# Patient Record
Sex: Female | Born: 1950
Health system: Southern US, Community
[De-identification: ages and names within clinical notes are randomized; demographics above are authoritative.]

## PROBLEM LIST (undated history)

## (undated) DIAGNOSIS — C801 Malignant (primary) neoplasm, unspecified: Secondary | ICD-10-CM

## (undated) DIAGNOSIS — I10 Essential (primary) hypertension: Secondary | ICD-10-CM

## (undated) DIAGNOSIS — S42309A Unspecified fracture of shaft of humerus, unspecified arm, initial encounter for closed fracture: Secondary | ICD-10-CM

## (undated) DIAGNOSIS — B019 Varicella without complication: Secondary | ICD-10-CM

## (undated) DIAGNOSIS — E785 Hyperlipidemia, unspecified: Secondary | ICD-10-CM

## (undated) DIAGNOSIS — T4145XA Adverse effect of unspecified anesthetic, initial encounter: Secondary | ICD-10-CM

## (undated) HISTORY — DX: Unspecified fracture of shaft of humerus, unspecified arm, initial encounter for closed fracture: S42.309A

## (undated) HISTORY — DX: Varicella without complication: B01.9

## (undated) HISTORY — DX: Hyperlipidemia, unspecified: E78.5

## (undated) HISTORY — DX: Essential (primary) hypertension: I10

## (undated) HISTORY — PX: TUBAL LIGATION: SHX77

---

## 1996-05-14 DIAGNOSIS — T8859XA Other complications of anesthesia, initial encounter: Secondary | ICD-10-CM

## 1996-05-14 HISTORY — PX: ABDOMINAL HYSTERECTOMY: SHX81

## 1996-05-14 HISTORY — DX: Other complications of anesthesia, initial encounter: T88.59XA

## 2001-05-22 ENCOUNTER — Other Ambulatory Visit: Admission: RE | Admit: 2001-05-22 | Discharge: 2001-05-22 | Payer: Self-pay | Admitting: Obstetrics and Gynecology

## 2002-02-12 ENCOUNTER — Encounter: Payer: Self-pay | Admitting: Emergency Medicine

## 2002-02-13 ENCOUNTER — Encounter: Payer: Self-pay | Admitting: Family Medicine

## 2002-02-13 ENCOUNTER — Inpatient Hospital Stay (HOSPITAL_COMMUNITY): Admission: EM | Admit: 2002-02-13 | Discharge: 2002-02-16 | Payer: Self-pay | Admitting: Emergency Medicine

## 2002-02-15 ENCOUNTER — Encounter: Payer: Self-pay | Admitting: Internal Medicine

## 2002-02-15 ENCOUNTER — Encounter (INDEPENDENT_AMBULATORY_CARE_PROVIDER_SITE_OTHER): Payer: Self-pay | Admitting: *Deleted

## 2002-05-14 HISTORY — PX: CHOLECYSTECTOMY: SHX55

## 2004-06-21 ENCOUNTER — Other Ambulatory Visit: Admission: RE | Admit: 2004-06-21 | Discharge: 2004-06-21 | Payer: Self-pay | Admitting: Obstetrics and Gynecology

## 2006-05-14 LAB — HM COLONOSCOPY: HM Colonoscopy: NORMAL

## 2007-01-01 ENCOUNTER — Ambulatory Visit (HOSPITAL_COMMUNITY): Admission: RE | Admit: 2007-01-01 | Discharge: 2007-01-01 | Payer: Self-pay | Admitting: Gastroenterology

## 2008-01-13 LAB — CONVERTED CEMR LAB: Pap Smear: NORMAL

## 2008-01-13 LAB — HM MAMMOGRAPHY: HM Mammogram: NORMAL

## 2008-03-11 ENCOUNTER — Encounter: Payer: Self-pay | Admitting: Family Medicine

## 2009-02-09 ENCOUNTER — Ambulatory Visit: Payer: Self-pay | Admitting: Family Medicine

## 2009-02-09 DIAGNOSIS — E785 Hyperlipidemia, unspecified: Secondary | ICD-10-CM | POA: Insufficient documentation

## 2009-02-09 DIAGNOSIS — I10 Essential (primary) hypertension: Secondary | ICD-10-CM | POA: Insufficient documentation

## 2009-02-09 LAB — CONVERTED CEMR LAB
ALT: 16 units/L (ref 0–35)
AST: 16 units/L (ref 0–37)
Albumin: 3.8 g/dL (ref 3.5–5.2)
Alkaline Phosphatase: 67 units/L (ref 39–117)
BUN: 15 mg/dL (ref 6–23)
Bilirubin, Direct: 0 mg/dL (ref 0.0–0.3)
CO2: 30 meq/L (ref 19–32)
Calcium: 8.8 mg/dL (ref 8.4–10.5)
Chloride: 108 meq/L (ref 96–112)
Cholesterol, target level: 200 mg/dL
Cholesterol: 169 mg/dL (ref 0–200)
Creatinine, Ser: 0.6 mg/dL (ref 0.4–1.2)
GFR calc non Af Amer: 108.46 mL/min (ref >60)
Glucose, Bld: 89 mg/dL (ref 70–99)
HDL goal, serum: 40 mg/dL
HDL: 42.9 mg/dL (ref >39.00)
LDL Cholesterol: 102 mg/dL — ABNORMAL HIGH (ref 0–99)
LDL Goal: 130 mg/dL
Potassium: 3.9 meq/L (ref 3.5–5.1)
Sodium: 142 meq/L (ref 135–145)
Total Bilirubin: 0.6 mg/dL (ref 0.3–1.2)
Total CHOL/HDL Ratio: 4
Total Protein: 6.6 g/dL (ref 6.0–8.3)
Triglycerides: 120 mg/dL (ref 0.0–149.0)
VLDL: 24 mg/dL (ref 0.0–40.0)

## 2010-01-19 ENCOUNTER — Telehealth: Payer: Self-pay | Admitting: Family Medicine

## 2010-02-09 ENCOUNTER — Ambulatory Visit: Payer: Self-pay | Admitting: Family Medicine

## 2010-02-09 LAB — CONVERTED CEMR LAB
ALT: 19 units/L (ref 0–35)
AST: 19 units/L (ref 0–37)
Albumin: 4.3 g/dL (ref 3.5–5.2)
Alkaline Phosphatase: 80 units/L (ref 39–117)
BUN: 20 mg/dL (ref 6–23)
Basophils Absolute: 0 10*3/uL (ref 0.0–0.1)
Basophils Relative: 0.3 % (ref 0.0–3.0)
Bilirubin Urine: NEGATIVE
Bilirubin, Direct: 0.1 mg/dL (ref 0.0–0.3)
CO2: 29 meq/L (ref 19–32)
Calcium: 9.1 mg/dL (ref 8.4–10.5)
Chloride: 101 meq/L (ref 96–112)
Cholesterol: 216 mg/dL — ABNORMAL HIGH (ref 0–200)
Creatinine, Ser: 0.7 mg/dL (ref 0.4–1.2)
Direct LDL: 137.9 mg/dL
Eosinophils Absolute: 0.1 10*3/uL (ref 0.0–0.7)
Eosinophils Relative: 1.2 % (ref 0.0–5.0)
GFR calc non Af Amer: 93.55 mL/min (ref >60)
Glucose, Bld: 84 mg/dL (ref 70–99)
Glucose, Urine, Semiquant: NEGATIVE
HCT: 35.7 % — ABNORMAL LOW (ref 36.0–46.0)
HDL: 52.7 mg/dL (ref >39.00)
Hemoglobin: 12.4 g/dL (ref 12.0–15.0)
Lymphocytes Relative: 40.4 % (ref 12.0–46.0)
Lymphs Abs: 2 10*3/uL (ref 0.7–4.0)
MCHC: 34.9 g/dL (ref 30.0–36.0)
MCV: 93.6 fL (ref 78.0–100.0)
Monocytes Absolute: 0.3 10*3/uL (ref 0.1–1.0)
Monocytes Relative: 7 % (ref 3.0–12.0)
Neutro Abs: 2.5 10*3/uL (ref 1.4–7.7)
Neutrophils Relative %: 51.1 % (ref 43.0–77.0)
Nitrite: NEGATIVE
Platelets: 248 10*3/uL (ref 150.0–400.0)
Potassium: 3.5 meq/L (ref 3.5–5.1)
Protein, U semiquant: NEGATIVE
RBC: 3.81 M/uL — ABNORMAL LOW (ref 3.87–5.11)
RDW: 12.6 % (ref 11.5–14.6)
Sodium: 138 meq/L (ref 135–145)
Specific Gravity, Urine: 1.025
TSH: 1.83 microintl units/mL (ref 0.35–5.50)
Total Bilirubin: 0.8 mg/dL (ref 0.3–1.2)
Total CHOL/HDL Ratio: 4
Total Protein: 7.1 g/dL (ref 6.0–8.3)
Triglycerides: 189 mg/dL — ABNORMAL HIGH (ref 0.0–149.0)
Urobilinogen, UA: 0.2
VLDL: 37.8 mg/dL (ref 0.0–40.0)
WBC Urine, dipstick: NEGATIVE
WBC: 4.8 10*3/uL (ref 4.5–10.5)
pH: 6

## 2010-02-21 ENCOUNTER — Ambulatory Visit: Payer: Self-pay | Admitting: Family Medicine

## 2010-06-09 ENCOUNTER — Ambulatory Visit
Admission: RE | Admit: 2010-06-09 | Discharge: 2010-06-09 | Payer: Self-pay | Source: Home / Self Care | Attending: Family Medicine | Admitting: Family Medicine

## 2010-06-09 DIAGNOSIS — R509 Fever, unspecified: Secondary | ICD-10-CM | POA: Insufficient documentation

## 2010-06-13 ENCOUNTER — Ambulatory Visit
Admission: RE | Admit: 2010-06-13 | Discharge: 2010-06-13 | Payer: Self-pay | Source: Home / Self Care | Attending: Family Medicine | Admitting: Family Medicine

## 2010-06-13 DIAGNOSIS — J159 Unspecified bacterial pneumonia: Secondary | ICD-10-CM | POA: Insufficient documentation

## 2010-06-13 NOTE — Progress Notes (Signed)
Summary: REFILL REQUEST #30, has OV 10/11  Phone Note Refill Request Message from:  Patient on January 19, 2010 2:56 PM  Refills Requested: Medication #1:  LISINOPRIL-HYDROCHLOROTHIAZIDE 20-12.5 MG TABS once daily   Notes: [facility] on [address].... Pt has cpx appt on 10/11....Marland KitchenMarland KitchenPt would like enough of med sent in to do her till her cpx appt on 10/11.  Medication #2:  PRAVASTATIN SODIUM 40 MG TABS once daily   Notes: [facility] on [address].... Pt has cpx appt on 10/11....Marland KitchenMarland KitchenPt would like enough of med sent in to do her till her cpx appt on 10/11.  Medication #3:  ESTRADIOL 0.5 MG TABS once daily.   Notes: [facility] on [address].... Pt has cpx appt on 10/11....Marland KitchenMarland KitchenPt would like enough of med sent in to do her till her cpx appt on 10/11.    Initial call taken by: Debbra Riding,  January 19, 2010 2:57 PM  Follow-up for Phone Call        Rx meds sent, pt informed Follow-up by: Sid Falcon LPN,  January 19, 2010 5:33 PM    Prescriptions: ESTRADIOL 0.5 MG TABS (ESTRADIOL) once daily  #30 x 0   Entered by:   Sid Falcon LPN   Authorized by:   Evelena Peat MD   Signed by:   Sid Falcon LPN on 40/02/2724   Method used:   Electronically to        [facility] [address] [facility] (retail)       [address]       [place], [place]  [address]       Ph: [phone]       Fax: [phone]   RxID:   [id number] PRAVASTATIN SODIUM 40 MG TABS (PRAVASTATIN SODIUM) once daily  #30 x 0   Entered by:   Sid Falcon LPN   Authorized by:   Evelena Peat MD   Signed by:   Sid Falcon LPN on 09/32/3557   Method used:   Electronically to        [facility]       [address]       [place], [place]  [address]       Ph: [phone]       Fax: [phone]   RxID:   [id number] LISINOPRIL-HYDROCHLOROTHIAZIDE 20-12.5 MG TABS (LISINOPRIL-HYDROCHLOROTHIAZIDE) once daily  #30 x 0   Entered by:    Sid Falcon LPN   Authorized by:   Evelena Peat MD   Signed by:   Sid Falcon LPN on 70/35/0093   Method used:   Electronically to        [facility]       [address]       [place], [place]  [address]       Ph: [phone]       Fax: [phone]   RxID:   [id number]

## 2010-06-13 NOTE — Assessment & Plan Note (Signed)
Summary: CPX // RS---PT [facility] // RS   Vital Signs:  Patient profile:   60 year old female Menstrual status:  hysterectomy Height:      63.75 inches Weight:      127 pounds BMI:     22.05 Temp:     98.6 degrees F oral Pulse rate:   72 / minute Pulse rhythm:   regular Resp:     12 per minute BP sitting:   120 / 82  (left arm) Cuff size:   regular  Vitals Entered By: Sid Falcon LPN (February 21, 2010 2:58 PM)  History of Present Illness: Patient here today for complete physical examination. She continues to see his gynecologist yearly for mammograms and every  third year Pap smears.  last tetanus unknown. No history of zostavax. Needs refills of regular medications.  Patient exercising regularly. Past medical history, social history, and family history reviewed.  Pt also here to addresse chronic problems of hypertension and hyperlipidemia.  Hypertension History:      She denies headache, chest pain, palpitations, dyspnea with exertion, orthopnea, peripheral edema, visual symptoms, neurologic problems, syncope, and side effects from treatment.  She notes no problems with any antihypertensive medication side effects.        Positive major cardiovascular risk factors include female age 67 years old or older, hyperlipidemia, and hypertension.  Negative major cardiovascular risk factors include no history of diabetes, negative family history for ischemic heart disease, and non-tobacco-user status.        Further assessment for target organ damage reveals no history of ASHD, stroke/TIA, or peripheral vascular disease.    Lipid Management History:      Positive NCEP/ATP III risk factors include female age 71 years old or older and hypertension.  Negative NCEP/ATP III risk factors include no history of early menopause without estrogen hormone replacement, non-diabetic, no family history for ischemic heart disease, non-tobacco-user status, no ASHD (atherosclerotic heart disease), no prior  stroke/TIA, no peripheral vascular disease, and no history of aortic aneurysm.      Allergies (verified): No Known Drug Allergies  Past History:  Past Medical History: Last updated: 02/09/2009 Anemia Chicken pox Hyperlipidemia Hypertension  Past Surgical History: Last updated: 02/09/2009 Cholecystectomy  2004 Hysterectomy  1998  Family History: Last updated: 02/09/2009 Family History of Alcoholism/Addiction Family History of Arthritis Family History Hypertension Family History of Stroke   Social History: Last updated: 02/09/2009 Occupation:  Physiological scientist Married Never Smoked Alcohol use-no Regular exercise-yes  Risk Factors: Exercise: yes (02/09/2009)  Risk Factors: Smoking Status: never (02/09/2009) PMH-FH-SH reviewed for relevance  Review of Systems  The patient denies anorexia, fever, weight loss, weight gain, vision loss, decreased hearing, hoarseness, chest pain, syncope, dyspnea on exertion, peripheral edema, prolonged cough, headaches, hemoptysis, abdominal pain, melena, hematochezia, severe indigestion/heartburn, hematuria, incontinence, genital sores, muscle weakness, suspicious skin lesions, transient blindness, difficulty walking, depression, unusual weight change, abnormal bleeding, enlarged lymph nodes, and breast masses.    Physical Exam  General:  Well-developed,well-nourished,in no acute distress; alert,appropriate and cooperative throughout examination Head:  Normocephalic and atraumatic without obvious abnormalities. No apparent alopecia or balding. Eyes:  No corneal or conjunctival inflammation noted. EOMI. Perrla. Funduscopic exam benign, without hemorrhages, exudates or papilledema. Vision grossly normal. Ears:  External ear exam shows no significant lesions or deformities.  Otoscopic examination reveals clear canals, tympanic membranes are intact bilaterally without bulging, retraction, inflammation or discharge. Hearing is grossly  normal bilaterally. Mouth:  Oral mucosa and oropharynx without lesions or exudates.  Teeth in good repair. Neck:  No deformities, masses, or tenderness noted. Breasts:  gyn Lungs:  Normal respiratory effort, chest expands symmetrically. Lungs are clear to auscultation, no crackles or wheezes. Heart:  Normal rate and regular rhythm. S1 and S2 normal without gallop, murmur, click, rub or other extra sounds. Abdomen:  Bowel sounds positive,abdomen soft and non-tender without masses, organomegaly or hernias noted. Genitalia:  gyn Msk:  No deformity or scoliosis noted of thoracic or lumbar spine.   Extremities:  No clubbing, cyanosis, edema, or deformity noted with normal full range of motion of all joints.   Neurologic:  No cranial nerve deficits noted. Station and gait are normal. Plantar reflexes are down-going bilaterally. DTRs are symmetrical throughout. Sensory, motor and coordinative functions appear intact. Skin:  no rashes and no suspicious lesions.   Cervical Nodes:  No lymphadenopathy noted Psych:  normally interactive, good eye contact, not anxious appearing, and not depressed appearing.     Impression & Recommendations:  Problem # 1:  Preventive Health Care (ICD-V70.0) tetanus booster given.  She will check with insurance regarding [place] vaccine coverage. labs reviewed with pt and no concerns.  Flu vaccine given.  Problem # 2:  HYPERTENSION (ICD-401.9) stable.  meds refilled. Her updated medication list for this problem includes:    Lisinopril-hydrochlorothiazide 20-12.5 Mg Tabs (Lisinopril-hydrochlorothiazide) ..... Once daily  Problem # 3:  HYPERLIPIDEMIA (ICD-272.4) refill med for one year. Her updated medication list for this problem includes:    Pravastatin Sodium 40 Mg Tabs (Pravastatin sodium) ..... Once daily  Complete Medication List: 1)  Lisinopril-hydrochlorothiazide 20-12.5 Mg Tabs (Lisinopril-hydrochlorothiazide) .... Once daily 2)  Pravastatin Sodium 40 Mg  Tabs (Pravastatin sodium) .... Once daily 3)  Estradiol 0.5 Mg Tabs (Estradiol) .... Once daily 4)  Calcium-vitamin D 600-200 Mg-unit Tabs (Calcium-vitamin d) .... One daily 5)  Vitamin C 500 Mg Tabs (Ascorbic acid) .... Once daily  Other Orders: Admin 1st Vaccine (08657) Flu Vaccine 4yrs + (84696) Tdap => 62yrs IM (29528) Admin of Any Addtl Vaccine (41324)  Hypertension Assessment/Plan:      The patient's hypertensive risk group is category B: At least one risk factor (excluding diabetes) with no target organ damage.  Her calculated 10 year risk of coronary heart disease is 9 %.  Today's blood pressure is 120/82.    Lipid Assessment/Plan:      Based on NCEP/ATP III, the patient's risk factor category is "2 or more risk factors and a calculated 10 year CAD risk of < 20%".  The patient's lipid goals are as follows: Total cholesterol goal is 200; LDL cholesterol goal is 130; HDL cholesterol goal is 40; Triglyceride goal is 150.    Patient Instructions: 1)  Check with insurance to determine if shingles (Zostavax) vaccine is covered. 2)  It is important that you exercise reguarly at least 20 minutes 5 times a week. If you develop chest pain, have severe difficulty breathing, or feel very tired, stop exercising immediately and seek medical attention.  Prescriptions: LISINOPRIL-HYDROCHLOROTHIAZIDE 20-12.5 MG TABS (LISINOPRIL-HYDROCHLOROTHIAZIDE) once daily  #90 x 3   Entered and Authorized by:   Evelena Peat MD   Signed by:   Evelena Peat MD on 02/21/2010   Method used:   Electronically to        [facility]       [address]       [place], [place]  [address]  Ph: [phone]       Fax: [phone]   RxID:   [id number] PRAVASTATIN SODIUM 40 MG TABS (PRAVASTATIN SODIUM) once daily  #90 x 3   Entered and Authorized by:   Evelena Peat MD   Signed by:   Evelena Peat MD on 02/21/2010   Method used:   Electronically to         [facility]       [address]       [place], [place]  [address]       Ph: [phone]       Fax: [phone]   RxID:   [id number]    Flu Vaccine Consent Questions     Do you have a history of severe allergic reactions to this vaccine? no    Any prior history of allergic reactions to egg and/or gelatin? no    Do you have a sensitivity to the preservative Thimersol? no    Do you have a past history of Guillan-Barre Syndrome? no    Do you currently have an acute febrile illness? no    Have you ever had a severe reaction to latex? no    Vaccine information given and explained to patient? yes    Are you currently pregnant? no    Lot Number:[id number]   Exp Date:11/11/2010   Site Given  Left Deltoid IMbflu   Immunizations Administered:  Tetanus Vaccine:    Vaccine Type: Tdap    Site: right deltoid    Mfr: GlaxoSmithKline    Dose: 0.5 ml    Route: IM    Given by: Sid Falcon LPN    Exp. Date: 02/12/2012    Lot #: [id number]    VIS given: 03/31/08 version given February 21, 2010.

## 2010-06-21 NOTE — Assessment & Plan Note (Signed)
Summary: 5 day follow-up pneumonia/nn   Vital Signs:  Patient profile:   60 year old female Menstrual status:  hysterectomy Weight:      130 pounds Temp:     98.8 degrees F oral BP sitting:   150 / 82  (left arm) Cuff size:   regular  Vitals Entered By: Sid Falcon LPN (June 13, 2010 9:37 AM)  History of Present Illness:  Followup recent left lower lobe pneumonia. patient was afebrile a little over 24 hours after starting antibiotic. overall feels much better. Does have some productive cough. Few days ago after coughing noticed sharp right midthoracic rib cage pain. Patient may have a broken rib. Pain with cough or sneeze. No hemoptysis. taking fluids well. no nausea or vomiting. Chest x-ray confirmed left lower lobe pneumonia  Allergies (verified): No Known Drug Allergies  Past History:  Past Medical History: Last updated: 02/09/2009 Anemia Chicken pox Hyperlipidemia Hypertension  Review of Systems  The patient denies anorexia, fever, hoarseness, chest pain, syncope, dyspnea on exertion, peripheral edema, and hemoptysis.    Physical Exam  General:  Well-developed,well-nourished,in no acute distress; alert,appropriate and cooperative throughout examination Neck:  No deformities, masses, or tenderness noted. Chest Wall:   tenderness right thoracic rib cage area around T7 to 8 posteriorly Lungs:   few faint rales left base. Right base clear. Heart:  Normal rate and regular rhythm. S1 and S2 normal without gallop, murmur, click, rub or other extra sounds. Extremities:   no edema   Impression & Recommendations:  Problem # 1:  BACTERIAL PNEUMONIA (ICD-482.9) Assessment Improved  Her updated medication list for this problem includes:    Azithromycin 250 Mg Tabs (Azithromycin) .[facility]... 2  by mouth today then one by mouth once daily for 4 days  Problem # 2:  RIB PAIN (ICD-786.50) Assessment: New   possible rib fracture versus muscular. Short-term use of hydrocodone.  Ace wrap with four-inch Ace around region did provide some relief  Orders: Ace Wraps 3-5 in/yard  (Z6109)  Complete Medication List: 1)  Lisinopril-hydrochlorothiazide 20-12.5 Mg Tabs (Lisinopril-hydrochlorothiazide) .... Once daily 2)  Pravastatin Sodium 40 Mg Tabs (Pravastatin sodium) .... Once daily 3)  Estradiol 0.5 Mg Tabs (Estradiol) .... Once daily 4)  Calcium-vitamin D 600-200 Mg-unit Tabs (Calcium-vitamin d) .... One daily 5)  Vitamin C 500 Mg Tabs (Ascorbic acid) .... Once daily 6)  Azithromycin 250 Mg Tabs (Azithromycin) .... 2  by mouth today then one by mouth once daily for 4 days 7)  Hydrocodone-acetaminophen 5-325 Mg Tabs (Hydrocodone-acetaminophen) .Marland Kitchen.. 1-2 by mouth q 6 hours as needed pain  Patient Instructions: 1)  Follow up immediately for any fever or shortness of breath. Prescriptions: HYDROCODONE-ACETAMINOPHEN 5-325 MG TABS (HYDROCODONE-ACETAMINOPHEN) 1-2 by mouth q 6 hours as needed pain  #30 x 0   Entered and Authorized by:   Evelena Peat MD   Signed by:   Evelena Peat MD on 06/13/2010   Method used:   Print then Give to Patient   RxID:   [id number]    Orders Added: 1)  Est. Patient Level III [95621] 2)  Ace Wraps 3-5 in/yard  [H0865]

## 2010-06-21 NOTE — Assessment & Plan Note (Signed)
Summary: congestion//ccm   Vital Signs:  Patient profile:   60 year old female Menstrual status:  hysterectomy Weight:      130 pounds Temp:     102.3 degrees F oral BP sitting:   140 / 80  (left arm) Cuff size:   regular  Vitals Entered By: Sid Falcon LPN (June 09, 2010 9:37 AM) CC: congestion, cough, fever   History of Present Illness: Patient seen with febrile illness.  Onset about 3 weeks ago typical URI type symptoms with nasal congestion and mild body aches. She had some laryngitis which has resolved. Nasal symptoms have cleared. Cough has , possibly worse past few days. Developed fever around 101-102 about 4 days ago. Cough mostly nonproductive. Some relief with Mucinex DM and NyQuil. She denies any skin rashes, headache, abdominal pain, or dysuria. No nausea, vomiting, or diarrhea.  Allergies (verified): No Known Drug Allergies  Past History:  Past Medical History: Last updated: 02/09/2009 Anemia Chicken pox Hyperlipidemia Hypertension  Review of Systems       The patient complains of fever.  The patient denies anorexia, weight loss, hoarseness, syncope, dyspnea on exertion, prolonged cough, hemoptysis, abdominal pain, melena, hematochezia, and enlarged lymph nodes.    Physical Exam  General:  Well-developed,well-nourished,in no acute distress; alert,appropriate and cooperative throughout examination Eyes:  pupils equal, pupils round, and pupils reactive to light.   Ears:  External ear exam shows no significant lesions or deformities.  Otoscopic examination reveals clear canals, tympanic membranes are intact bilaterally without bulging, retraction, inflammation or discharge. Hearing is grossly normal bilaterally. Mouth:  Oral mucosa and oropharynx without lesions or exudates.  Teeth in good repair. Neck:  No deformities, masses, or tenderness noted. Lungs:  Normal respiratory effort, chest expands symmetrically. Lungs are clear to auscultation, no crackles or  wheezes. Heart:  Normal rate and regular rhythm. S1 and S2 normal without gallop, murmur, click, rub or other extra sounds. Abdomen:  soft, non-tender, and no masses.   Extremities:  No clubbing, cyanosis, edema, or deformity noted with normal full range of motion of all joints.   Skin:  skin is warm to touch. No rashes Cervical Nodes:  No lymphadenopathy noted   Impression & Recommendations:  Problem # 1:  FEVER UNSPECIFIED (ICD-780.60) recent likely viral illness, rule out pneumonia wtih "double sickening".  Check CXR and start antibiotics. Orders: T-2 View CXR (71020TC)  Complete Medication List: 1)  Lisinopril-hydrochlorothiazide 20-12.5 Mg Tabs (Lisinopril-hydrochlorothiazide) .... Once daily 2)  Pravastatin Sodium 40 Mg Tabs (Pravastatin sodium) .... Once daily 3)  Estradiol 0.5 Mg Tabs (Estradiol) .... Once daily 4)  Calcium-vitamin D 600-200 Mg-unit Tabs (Calcium-vitamin d) .... One daily 5)  Vitamin C 500 Mg Tabs (Ascorbic acid) .... Once daily 6)  Azithromycin 250 Mg Tabs (Azithromycin) .... 2  by mouth today then one by mouth once daily for 4 days  Patient Instructions: 1)  Be in touch by Monday if fever not resolved and follow up sooner if symptoms worsen. Prescriptions: AZITHROMYCIN 250 MG TABS (AZITHROMYCIN) 2  by mouth today then one by mouth once daily for 4 days  #6 x 0   Entered and Authorized by:   Evelena Peat MD   Signed by:   Evelena Peat MD on 06/09/2010   Method used:   Electronically to        [facility]       [address]       [place]  [place], [place]  [address]       Ph: [phone]       Fax: [phone]   RxID:   [id number]    Orders Added: 1)  T-2 View CXR [71020TC] 2)  Est. Patient Level III [32951]

## 2010-09-26 NOTE — Op Note (Signed)
Bridget Strong, Bridget Strong               ACCOUNT NO.:  [id number]   MEDICAL RECORD NO.:  [id number]          PATIENT TYPE:  AMB   LOCATION:  ENDO                         FACILITY:  [facility]   PHYSICIAN:  Anselmo Rod, M.D.  DATE OF BIRTH:  1950/12/24   DATE OF PROCEDURE:  01/01/2007  DATE OF DISCHARGE:                               OPERATIVE REPORT   PROCEDURE PERFORMED:  Screening colonoscopy.   ENDOSCOPIST:  Anselmo Rod, M.D.   INSTRUMENT USED:  Pentax video colonoscope.   INDICATION FOR PROCEDURE:  A 60 year old white female undergoing  screening colonoscopy to rule out colonic polyps, masses, etc.   PREPROCEDURE PREPARATION:  Informed consent was procured from the  patient.  The patient was fasted for 8 hours prior to the procedure and  prepped with a bottle of magnesium citrate and a gallon of NuLYTELY the  night prior to the procedure.  The risks and benefits of the procedure  including a 10% miss rate of cancer and polyps was discussed with the  patient as well.   PREPROCEDURE PREPARATION:  VITAL SIGNS:  The patient had stable vital  signs.  NECK:  Supple.  CHEST:  Clear to auscultation.  S1, S2 regular.  ABDOMEN:  Soft with normal bowel sounds.   DESCRIPTION OF PROCEDURE:  The patient was placed in the left lateral  decubitus position and sedated with 100 mcg of Fentanyl and 7 mg of  Versed intravenously in slow incremental doses.  Once the patient was  adequately sedate and maintained on low-flow oxygen and continuous  cardiac monitoring, the Pentax video colonoscope was advanced from the  rectum to the cecum.  Multiple washes were done. The appendiceal orifice  and the ileocecal valve were clearly visualized and photographed. The  terminal ileum appeared healthy and without lesions. No masses, polyps,  erosions, ulcerations or diverticula were seen. Retroflexion in the  rectum revealed no abnormalities.  The patient tolerated the procedure  well without any immediate  complications.   IMPRESSION:  Normal colonoscopy up to the terminal ileum.  No masses,  polyps or diverticula noted.   RECOMMENDATIONS:  1. Continue a high-fiber diet with liberal fluid intake.  2. Screening colonoscopy in the next 10 years unless the patient      develops any abnormal symptoms in the interim, in which case she      should contact the office immediately for further recommendations.  3. Outpatient follow-up as the need arises in the future.      Anselmo Rod, M.D.  Electronically Signed     JNM/MEDQ  D:  01/01/2007  T:  01/02/2007  Job:  [id number]   cc:   Elizabeth Palau, NP   Gloriajean Dell. Andrey Campanile, M.D.  Fax: [phone]

## 2010-09-29 NOTE — Discharge Summary (Signed)
   NAMELOVENIA, DEBRULER NO.:  [id number]   MEDICAL RECORD NO.:  [id number]                   PATIENT TYPE:   LOCATION:                                       FACILITY:   PHYSICIAN:  Lonia Blood, M.D.            DATE OF BIRTH:  1950/10/30   DATE OF ADMISSION:  02/13/2002  DATE OF DISCHARGE:  02/16/2002                                 DISCHARGE SUMMARY   DISCHARGE DIAGNOSES:  1. Gallstone pancreatitis -- resolved.  2. Cholelithiasis with high risk for recurrent gallstone pancreatitis --     status post laparoscopic cholecystectomy.  3. History of idiopathic peripheral neuropathy.  4. Status post vaginal hysterectomy.   DISCHARGE MEDICATIONS:  Vicodin p.r.n. and Neurontin as previously dosed for  peripheral neuropathy.   HISTORY OF PRESENT ILLNESS:  The patient is a 60 year old who presented to  the hospital on February 13, 2002 with complaints of five days of developing  progressive abdominal pain with nausea and vomiting.  She reported loose  stools but no diarrhea.  She was initially evaluated in the office of  [facility], at which time the patient's abdomen was noted  to be severely tender and therefore, she was referred directly to the  emergency room.  In the emergency room, evaluation revealed significant  gallstones and lipase elevated at 2075, consistent with pancreatitis.   HOSPITAL COURSE:  At admission, the patient was noted to have a significant  transaminitis with markedly increased lipase at 2075.  Ultrasound revealed  significant gallstones within the gallbladder; diagnosis of cholelithiasis  with gallstone pancreatitis was made.  The patient was maintained on n.p.o.  status and GI was consulted.  GI performed an ERCP on February 13, 2002 with  successful sphincterotomy but no evidence of retained stones within the  common bile duct.  The patient was treated medically with IV fluid hydration  and clear liquid diet,  until such time that her pancreatitis had resolved.  General surgery had been consulted and at such time that the patient's  pancreatitis had resolved, she was taken to the operating room for a  laparoscopic cholecystectomy; this was accomplished without significant  difficulty and on October 6th, the patient was seen in postop followup by  general surgery and felt to be doing quite well.  She was subsequently  discharged home in stable condition.  The patient's peripheral neuropathy  was not a significant issue during this hospitalization.                                               Lonia Blood, M.D.    JTM/MEDQ  D:  04/29/2002  T:  05/01/2002  Job:  [id number]

## 2010-09-29 NOTE — Op Note (Signed)
Bridget Strong, Bridget Strong                         ACCOUNT NO.:  [id number]   MEDICAL RECORD NO.:  [id number]                   PATIENT TYPE:  INP   LOCATION:  5705                                 FACILITY:  [facility]   PHYSICIAN:  Jimmye Norman III, M.D.               DATE OF BIRTH:  04-Feb-1951   DATE OF PROCEDURE:  02/15/2002  DATE OF DISCHARGE:                                 OPERATIVE REPORT   PREOPERATIVE DIAGNOSIS:  Symptomatic cholelithiasis and gallstone  pancreatitis.   POSTOPERATIVE DIAGNOSIS:  Symptomatic cholelithiasis and gallstone  pancreatitis.   PROCEDURE:  Laparoscopic cholecystectomy with intraoperative cholangiogram.   SURGEON:  Jimmye Norman, M.D.   ASSISTANT:  None.   ANESTHESIA:  General endotracheal.   ESTIMATED BLOOD LOSS:  Less than 30 cc.   COMPLICATIONS:  Failed insufflating, causing 30 minute delay.   CONDITION:  Stable.   FINDINGS:  Normal cholangiogram.  Adhesions of omentum to the gallbladder  wall.  The patient also had a thickened cystic duct.   SPECIMENS:  Gallbladder with stones.   INDICATION FOR OPERATION:  The patient is a 60 year old female brought in  with abdominal pain, right upper quadrant and epigastrium to the back with  an amylase and lipase both over 1500.  She comes in now for a laparoscopic  cholecystectomy after her pancreatitis has resolved.  She did get an ERCP  preoperatively which showed no common duct stones.   OPERATION:  The patient was taken to the operating room and placed on the  table in the supine position.  After an adequate induction of general  anesthetic was administered, she was prepped and draped in the usual sterile  manner, exposing the midline and the right upper quadrant of the abdomen.   Initially, we tried to use a Veress needle to penetrate the peritoneal  cavity and after successful penetration with a good saline test, we were  unable to insufflate it because of a failed insufflator.  We converted to a  Hasson cannula and did an open technique with 0 Vicryl and the funneled  Hasson cannula, however, this failed to insufflate also, and this is why we  realized that the insufflator was defective, causing about a thirty minute  delay and the use of multiple pieces of equipment.   Once we had the right insufflator with the Hasson cannula in place, we were  able to insufflate up to a maximum intraabdominal pressure of 15 mmHg.  Two  subcostal 5 mm cannulas, in the subxiphoid, two 11 mm cannulas were passed  under direct vision into the peritoneal cavity.  Once this was done, we were  able to inspect the area well.  We placed the patient in a reverse  Trendelenburg position, left side was tilted down and the dissection begun.   Initially, the omental adhesions had to be stripped from the gallbladder  body using blunt dissection with [facility].  We subsequently were  able to dissect out the cystic duct and the cystic artery, Endo clipped them  proximally and distally.  Prior to putting in the distal cystic duct, we did  perform a cholangiogram through cholecystodochotomy made using Endo-  scissors.  This demonstrated flow into the duodenum, proximal flow, and no  evidence of stones.   The common bile duct was also normal.  Once the cholangiogram was done, we  removed the clips, removed the cannula, clipped the cystic duct proximally  with two Endo-Clips and then transected the cystic duct.  We transected the  cystic artery and then dissected the gallbladder out of its bed without  entrance into the gallbladder.  In spite of this, we used the Endo-Catch bag  to bring the gallbladder out through the supraumbilical fascia with minimal  difficulty.  An additional 0 Vicryl suture of eight stitch had to be placed  at the supraumbilical site to close the fascia.   Once we had done this, we irrigated with about 800 cc of warm saline  solution.  There was minimal bleeding and no bile  staining.  We allowed all  gas to escape through the cannulas.  With the supraumbilical fascia closed,  we injected 0.25% Marcaine into all incision sites and closed the skin using  a running subcuticular stitch of 4-0 Vicryl.  Sterile dressings were then  applied.  All needle counts, sponge counts, and instrument counts were  correct.                                               Bridget Strong, M.D.    JW/MEDQ  D:  02/15/2002  T:  02/16/2002  Job:  [id number]

## 2010-09-29 NOTE — Op Note (Signed)
Bridget Strong, Bridget Strong                         ACCOUNT NO.:  [id number]   MEDICAL RECORD NO.:  [id number]                   PATIENT TYPE:  INP   LOCATION:  5705                                 FACILITY:  [facility]   PHYSICIAN:  Danise Edge, M.D.                DATE OF BIRTH:  [birth year]   DATE OF PROCEDURE:  02/13/2002  DATE OF DISCHARGE:  02/16/2002                                 OPERATIVE REPORT   PREOPERATIVE DIAGNOSIS:  Endoscopic retrograde cholangiopancreatography with  endoscopic sphincterotomy.   INDICATIONS:  The patient is a 60 year old female born 05/14/2051.  The  patient was admitted to the hospital with epigastric pain, nausea, and  vomiting.  Evaluation in the emergency room reveals gallstones, gallstone  pancreatitis, and jaundice with elevated liver enzymes.   I discussed with the patient the complications associated with endoscopic  retrograde cholangiopancreatography with endoscopic sphincterotomy,  including pancreatitis, bleeding, infection, and intestinal perforation.  The patient has signed the operative permit.   MEDICATION ALLERGIES:  None.   CHRONIC MEDICATIONS:  Elavil for peripheral neuropathy, estrogen.   PAST MEDICAL HISTORY:  Vaginal hysterectomy, peripheral neuropathy.   SOCIAL HISTORY:  The patient is married.  She does not smoke cigarettes or  consume alcohol.   ENDOSCOPIST:  Danise Edge, M.D.   PREMEDICATION:  Versed 5 mg, fentanyl 50 mcg, Glucagon 0.5 mg.   ENDOSCOPE:  Olympus therapeutic duodenoscope.   DESCRIPTION OF PROCEDURE:  After obtaining informed consent, the patient was  placed in the prone positron on the fluoroscopy table.  I administered  intravenous fentanyl and intravenous Versed to achieve conscious sedation  for the procedure.  The patient's blood pressure, oxygen saturation, and  cardiac rhythm were monitored throughout the procedure and documented in the  medical record.   The Olympus therapeutic duodenoscope was  passed through the posterior  hypopharynx, down the esophagus, and into the proximal stomach without  examining the esophagus.  A normal-appearing pylorus was intubated without  gastric evaluation.  The endoscope was advanced to the second portion of the  duodenum without evaluation of the duodenal bulb.   Major papilla:  Endoscopically the major papilla appears completely normal.   Pancreatogram:  The pancreatic duct was not cannulated and was not injected  with contrast.   Cholangiogram:  The common bile duct was freely cannulated using the triple-  lumen sphincterotome.  The guidewire was placed in the proximal common bile  duct.  A cholangiogram was performed, revealing a small filling defect,  possibly a stone, in the distal common bile duct.  The cystic duct was  patent.  The intrahepatic ducts appeared normal.  A small sphincterotomy was  performed.  Multiple sweeps of the extrahepatic bile duct with the 12 mm  balloon catheter returned no significant stone fragments.  A post-  sphincterotomy occlusion cholangiogram was unremarkable.   RECOMMENDATIONS:  Proceed with a laparoscopic cholecystectomy with operative  cholangiogram after  resolution of the gallstone pancreatitis.                                               Danise Edge, M.D.    MJ/MEDQ  D:  02/13/2002  T:  02/16/2002  Job:  [id number]   cc:   Teena Irani. Arlyce Dice, M.D.

## 2010-09-29 NOTE — H&P (Signed)
Bridget Strong, Bridget Strong                         ACCOUNT NO.:  [id number]   MEDICAL RECORD NO.:  [id number]                   PATIENT TYPE:  INP   LOCATION:  1832                                 FACILITY:  [facility]   PHYSICIAN:  Delorse Lek, M.D.              DATE OF BIRTH:  [birth year]   DATE OF ADMISSION:  02/12/2002  DATE OF DISCHARGE:                                HISTORY & PHYSICAL   CHIEF COMPLAINT:  This is a 60 year old white female with a past medical  history significant for peripheral neuropathy.  Her chief complaint is  abdominal pain, nausea and vomiting.   HISTORY OF PRESENT ILLNESS:  Approximately five days ago she developed some  significant epigastric pain with nausea and vomiting.  She has had a few  loose stools but no true diarrhea.  Over the last three days she has noticed  her urine getting darker, stool getting lighter.  She has become  significantly pruritic, although no rash.  On February 11, 2002, she ate  dinner at [employer].  This caused increased pain with vomiting.  Yesterday all  she had was four crackers and some sips of water.  She came to the  [facility] on the night prior to admission.  She saw Dr.  Teena Irani. Arlyce Dice, who felt that she may have had a surgical abdomen, and she  was sent to the emergency room where an evaluation revealed gallstones with  a bilirubin of 5, and an amylase of 1104, lipase 2075, AST 67, ALT 210.  Of  note is that she restarted her estrogens three days before this attack.  She  had not taken any estrogens from June through September.  She also had an  attack in May and one many months prior to that.   PAST SURGICAL HISTORY:  1. Hospitalization/surgery:  She had a TVH in 1996, by Dr. Miguel Aschoff and     childbirth x3.   ALLERGIES:  No known drug allergies.   MEDICATIONS:  1. Elavil ? 100 mg q.h.s. for peripheral neuropathy (workup by [facility]urology).  2. She has been on estrogen for the last eight  days.   PAST MEDICAL HISTORY:  Illnesses, only as above.   REVIEW OF SYSTEMS:  Negative except for a weight gain related to the Elavil  and others, as mentioned above.   FAMILY HISTORY:  Father died at age 48 of a CVA.  He was an alcoholic.  Mother is alive at age 66, in good health.  There is no family history of  diabetes mellitus, hypertension, cancer, or gallbladder disease.   SOCIAL HISTORY:  She does not smoke, drink, or use illicit drugs.  She is  married and has three children.  She works as a Diplomatic Services operational officer at [employer].   PHYSICAL EXAMINATION:  VITAL SIGNS:  Temperature 98.2 degrees, pulse 86 and  regular, respirations  22 and unlabored, blood pressure 104/54, pulse  oximetry 100% on room air.  HEENT:  Normocephalic, atraumatic.  Pupils equal, reactive.  Sclerae  icteric.  Tympanic membranes okay.  Oropharynx and mucous membranes are  moist.  NECK:  Supple without significant nodes or thyromegaly.  LUNGS:  No rales or rhonchi.  HEART:  No murmurs, gallops, or rubs.  ABDOMEN:  Tender epigastric area but no HSM.  No masses, no guarding or  rebound.  BREASTS/PELVIC/RECTAL:  Deferred, not pertinent to admission.  EXTREMITIES:  No clubbing, cyanosis, or edema.  NEUROLOGIC:  Nonlateralizing.  DERMATOLOGIC:  Looks slightly jaundiced.   LABORATORY DATA:  White count 7100 with 79 polys, 16 lymphs, 5 monos,  hemoglobin 11.8, platelets 288,000.  Urine reveals a specific gravity 1.009,  pH 6.5, large bilirubin, and 15 ketones.  INR 0.8, PTT 36.  Total bilirubin  was 5.  Electrolytes normal.  Glucose minimally elevated at 114.  AST/ALT  67/210 respectively.  Alkaline phosphatase 369, lipase 2075, amylase 1104.  A gallbladder ultrasound revealed multiple stones, slight gallbladder wall  thickening.  The bile duct does not appear to be enlarged.   ASSESSMENT:  1. Gallstones with probable common bile duct stone that may have been     passed.   PLAN:  Will contact the GI doctor  in the morning (Dr. Katy Fitch. Buccini et  al), for a probable ERCP and probable laparoscopic cholecystectomy.  Will  contact [facility] for that.   1. Peripheral neuropathy.   PLAN:  Hold Elavil for now until the liver function tests become normal.   1. Hormone replacement therapy.   PLAN:  Hold estrogens for now.                                                Delorse Lek, M.D.    BAB/MEDQ  D:  02/13/2002  T:  02/16/2002  Job:  [id number]   cc:   Florencia Reasons, M.D.  [address], [address]  [place], [place] [address]  Fax: [phone]   [facility]   Miguel Aschoff, M.D.

## 2010-09-29 NOTE — Consult Note (Signed)
NAMEKHLOEI, SPIKER                         ACCOUNT NO.:  [id number]   MEDICAL RECORD NO.:  [id number]                   PATIENT TYPE:  INP   LOCATION:  1832                                 FACILITY:  [facility]   PHYSICIAN:  Jimmye Norman III, M.D.               DATE OF BIRTH:  [birth year]   DATE OF CONSULTATION:  02/13/2002  DATE OF DISCHARGE:                                   CONSULTATION   HISTORY OF PRESENT ILLNESS:  The patient is a 60 year old female with  abdominal pain since Sunday who now has been diagnosed with gallstone  pancreatitis, and possibly a common bile duct stone.   Now that she has been diagnosed, the patient can recall up two other  episodes of a similar abdominal pain but not as severe and not getting her  to come to an emergency room or to see a physician.   This started on Sunday, progressed and she had noticed darkened urine and  had developed her pruritus but did not really notice the change in the skin  color or her sclerae.  In the ED, she was noticed to be jaundiced and  subsequently workup led to the diagnosis of gallstone pancreatitis along  with a hypobilirubinemia.   PAST MEDICAL HISTORY:  Significant for peripheral neuropathy in bilateral  lower extremities which is idiopathic.  For that she does take Elavil.  She  takes no other medications.   ALLERGIES:  She is not allergic to any medications.   SOCIAL HISTORY:  She is a nondrinker, nonsmoker, does not take any drugs.   PAST SURGICAL HISTORY:  She has had a vaginal hysterectomy.   REVIEW OF SYSTEMS:  She has had pruritus, darkened urine, odd-colored stool.  No fevers or chills but has had nausea and vomiting.   PHYSICAL EXAMINATION:  VITAL SIGNS:  She is afebrile.  Her other vital signs  are stable.  HEENT:  She is normocephalic, atraumatic.  She has scleral icterus  bilaterally.  NECK:  She has no masses or bruits.  CHEST:  Clear to auscultation.  CARDIAC:  She has a regular rhythm and  rate and a grade 1 murmur at the left  lower sternal border.  ABDOMEN:  Mildly distended with epigastric tenderness but no diffuse  peritonitis and hypoactive bowel sounds.  PELVIC:  Not performed.  RECTAL:  Not performed.  LOWER EXTREMITIES:  She has good peripheral pulses, no tingling or numbness.  NEUROLOGICAL:  Cranial nerves II-XII are grossly intact.   LABORATORY DATA:  She has a normal white count of 7100, hemoglobin 11.8,  hematocrit 34.8, platelets 288,000.  Her bili is 5.0, SGOT and SGPT are  moderately elevated.  Her amylase is over 1000, actually 1104 and her lipase  is also over 2000 at 2075.  Her electrolytes are within normal limits.   IMPRESSION:  1. Gallstone pancreatitis based on an ultrasound demonstrating mildly  thickened gallbladder wall along with gallstones.  2. Possible common bile duct stones with hyperbilirubinemia and pruritus     secondary to them.   PLAN:  The plan would be for the patient to get a GI consultation and for  them to decide if an ERCP should be done based on either worsening LFTs or  nonimprovement in the liver function tests.  However, in the face of active  pancreatitis ERCP could be risky and this must be weighed in the decision  whether or not to perform the procedure.  Otherwise, we can delay ERCP and  possibly go to straight to cholecystectomy once her pancreatitis has  resolved or improved significantly.  At the current rate, I expect this may  be Sunday.                                               Kathrin Ruddy, M.D.    JW/MEDQ  D:  02/13/2002  T:  02/16/2002  Job:  [id number]   cc:   Rosanne Sack, M.D.  Fax: [phone]   GI Attending

## 2011-02-13 ENCOUNTER — Other Ambulatory Visit (INDEPENDENT_AMBULATORY_CARE_PROVIDER_SITE_OTHER): Payer: 59

## 2011-02-13 DIAGNOSIS — Z0000 Encounter for general adult medical examination without abnormal findings: Secondary | ICD-10-CM

## 2011-02-13 LAB — BASIC METABOLIC PANEL
BUN: 22 mg/dL (ref 6–23)
CO2: 30 mEq/L (ref 19–32)
Chloride: 103 mEq/L (ref 96–112)
GFR: 74.08 mL/min (ref >60.00)
Glucose, Bld: 96 mg/dL (ref 70–99)
Potassium: 3.8 mEq/L (ref 3.5–5.1)

## 2011-02-13 LAB — CBC WITH DIFFERENTIAL/PLATELET
Basophils Absolute: 0 10*3/uL (ref 0.0–0.1)
HCT: 38.9 % (ref 36.0–46.0)
Lymphs Abs: 2 10*3/uL (ref 0.7–4.0)
MCHC: 33.2 g/dL (ref 30.0–36.0)
MCV: 94.3 fl (ref 78.0–100.0)
Monocytes Absolute: 0.4 10*3/uL (ref 0.1–1.0)
Platelets: 257 10*3/uL (ref 150.0–400.0)
RDW: 12.9 % (ref 11.5–14.6)

## 2011-02-13 LAB — HEPATIC FUNCTION PANEL
Albumin: 4.3 g/dL (ref 3.5–5.2)
Total Bilirubin: 0.7 mg/dL (ref 0.3–1.2)

## 2011-02-13 LAB — LIPID PANEL
Cholesterol: 211 mg/dL — ABNORMAL HIGH (ref 0–200)
Total CHOL/HDL Ratio: 4
Triglycerides: 198 mg/dL — ABNORMAL HIGH (ref 0.0–149.0)
VLDL: 39.6 mg/dL (ref 0.0–40.0)

## 2011-02-13 LAB — POCT URINALYSIS DIPSTICK
Bilirubin, UA: NEGATIVE
Blood, UA: NEGATIVE
Nitrite, UA: NEGATIVE
Spec Grav, UA: 1.02
Urobilinogen, UA: 1
pH, UA: 7

## 2011-02-16 ENCOUNTER — Encounter: Payer: Self-pay | Admitting: Family Medicine

## 2011-02-19 ENCOUNTER — Encounter: Payer: Self-pay | Admitting: Family Medicine

## 2011-02-19 ENCOUNTER — Ambulatory Visit (INDEPENDENT_AMBULATORY_CARE_PROVIDER_SITE_OTHER): Payer: 59 | Admitting: Family Medicine

## 2011-02-19 VITALS — BP 110/70 | HR 72 | Temp 98.0°F | Resp 12 | Ht 63.5 in | Wt 128.0 lb

## 2011-02-19 DIAGNOSIS — Z23 Encounter for immunization: Secondary | ICD-10-CM

## 2011-02-19 DIAGNOSIS — Z0000 Encounter for general adult medical examination without abnormal findings: Secondary | ICD-10-CM

## 2011-02-19 MED ORDER — ESTRADIOL 0.5 MG PO TABS: 0.5000 mg | ORAL_TABLET | Freq: Every day | ORAL | Status: DC

## 2011-02-19 MED ORDER — LISINOPRIL-HYDROCHLOROTHIAZIDE 20-12.5 MG PO TABS: 1.0000 | ORAL_TABLET | Freq: Every day | ORAL | Status: DC

## 2011-02-19 MED ORDER — PRAVASTATIN SODIUM 40 MG PO TABS: 40.0000 mg | ORAL_TABLET | Freq: Every day | ORAL | Status: DC

## 2011-02-19 NOTE — Patient Instructions (Signed)
Check on insurance coverage for shingles vaccine. 

## 2011-02-19 NOTE — Progress Notes (Signed)
  Subjective:    Patient ID: Bridget Strong, female    DOB: Nov 05, 1950, 60 y.o.   MRN: [id number]  HPI Patient here for well visit. Previous hysterectomy for benign disease. Gets mammogram through gynecologist office. Needs flu vaccine. No indication for Pneumovax yet. No history of shingles vaccine and patient uncertain of insurance coverage. Colonoscopy 2008 normal. Patient exercises regularly.  Hypertension and hyperlipidemia and medications reviewed. No side effects from medication and compliant with all medications. She takes low-dose estradiol 0.5 mg 1 daily. Takes some calcium and vitamin D supplementation. Nonsmoker. Past medical history reviewed as below  Past Medical History  Diagnosis Date  . Anemia   . Chicken pox   . Hyperlipidemia   . Hypertension    Past Surgical History  Procedure Date  . Cholecystectomy 2004  . Abdominal hysterectomy 1998    reports that she has never smoked. She does not have any smokeless tobacco history on file. Her alcohol and drug histories not on file. family history includes Alcohol abuse in her father and other; Arthritis in her other; Hypertension in her other; and Stroke in her father and other. Allergies  Allergen Reactions  . Hydrocodone     Nausea, GI upset      Review of Systems  Constitutional: Negative for fever, activity change, appetite change, fatigue and unexpected weight change.  HENT: Negative for hearing loss, ear pain, sore throat and trouble swallowing.   Eyes: Negative for visual disturbance.  Respiratory: Negative for cough and shortness of breath.   Cardiovascular: Negative for chest pain and palpitations.  Gastrointestinal: Negative for abdominal pain, diarrhea, constipation and blood in stool.  Genitourinary: Negative for dysuria and hematuria.  Musculoskeletal: Negative for myalgias, back pain and arthralgias.  Skin: Negative for rash.  Neurological: Negative for dizziness, syncope and headaches.    Hematological: Negative for adenopathy.  Psychiatric/Behavioral: Negative for confusion and dysphoric mood.       Objective:   Physical Exam  Constitutional: She is oriented to person, place, and time. She appears well-developed and well-nourished.  HENT:  Head: Normocephalic and atraumatic.  Eyes: EOM are normal. Pupils are equal, round, and reactive to light.  Neck: Normal range of motion. Neck supple. No thyromegaly present.  Cardiovascular: Normal rate, regular rhythm and normal heart sounds.   No murmur heard. Pulmonary/Chest: Breath sounds normal. No respiratory distress. She has no wheezes. She has no rales.  Abdominal: Soft. Bowel sounds are normal. She exhibits no distension and no mass. There is no tenderness. There is no rebound and no guarding.  Musculoskeletal: Normal range of motion. She exhibits no edema.  Lymphadenopathy:    She has no cervical adenopathy.  Neurological: She is alert and oriented to person, place, and time. She displays normal reflexes. No cranial nerve deficit.  Skin: No rash noted.  Psychiatric: She has a normal mood and affect. Her behavior is normal. Judgment and thought content normal.          Assessment & Plan:  Healthy 60 year old female. Flu vaccine given. Check on insurance coverage for shingles vaccine. Colonoscopy up to date. Flu vaccine today. Pneumovax in 4 years. Labs reviewed with patient and all favorable. Refilled medications for one year

## 2012-02-21 ENCOUNTER — Other Ambulatory Visit (INDEPENDENT_AMBULATORY_CARE_PROVIDER_SITE_OTHER): Payer: 59

## 2012-02-21 DIAGNOSIS — Z0000 Encounter for general adult medical examination without abnormal findings: Secondary | ICD-10-CM

## 2012-02-21 LAB — POCT URINALYSIS DIPSTICK
Bilirubin, UA: NEGATIVE
Ketones, UA: NEGATIVE
Leukocytes, UA: NEGATIVE
Protein, UA: NEGATIVE
Spec Grav, UA: 1.015
pH, UA: 7

## 2012-02-21 LAB — CBC WITH DIFFERENTIAL/PLATELET
Basophils Relative: 0.5 % (ref 0.0–3.0)
Eosinophils Relative: 1.2 % (ref 0.0–5.0)
Lymphocytes Relative: 42.6 % (ref 12.0–46.0)
MCV: 93.4 fl (ref 78.0–100.0)
Monocytes Absolute: 0.4 10*3/uL (ref 0.1–1.0)
Neutrophils Relative %: 46.3 % (ref 43.0–77.0)
Platelets: 242 10*3/uL (ref 150.0–400.0)
RBC: 4.05 Mil/uL (ref 3.87–5.11)
WBC: 4.6 10*3/uL (ref 4.5–10.5)

## 2012-02-21 LAB — BASIC METABOLIC PANEL
BUN: 17 mg/dL (ref 6–23)
Calcium: 9.1 mg/dL (ref 8.4–10.5)
Chloride: 105 mEq/L (ref 96–112)
Creatinine, Ser: 0.7 mg/dL (ref 0.4–1.2)
GFR: 88.42 mL/min (ref >60.00)

## 2012-02-21 LAB — HEPATIC FUNCTION PANEL
ALT: 18 U/L (ref 0–35)
Alkaline Phosphatase: 72 U/L (ref 39–117)
Bilirubin, Direct: 0.1 mg/dL (ref 0.0–0.3)
Total Bilirubin: 0.7 mg/dL (ref 0.3–1.2)
Total Protein: 7.1 g/dL (ref 6.0–8.3)

## 2012-02-21 LAB — LIPID PANEL
Cholesterol: 219 mg/dL — ABNORMAL HIGH (ref 0–200)
HDL: 44.5 mg/dL (ref >39.00)
Total CHOL/HDL Ratio: 5
VLDL: 41.8 mg/dL — ABNORMAL HIGH (ref 0.0–40.0)

## 2012-02-28 ENCOUNTER — Encounter: Payer: 59 | Admitting: Family Medicine

## 2012-03-07 ENCOUNTER — Ambulatory Visit (INDEPENDENT_AMBULATORY_CARE_PROVIDER_SITE_OTHER): Payer: 59 | Admitting: Family Medicine

## 2012-03-07 ENCOUNTER — Encounter: Payer: Self-pay | Admitting: Family Medicine

## 2012-03-07 VITALS — BP 120/70 | HR 72 | Temp 97.8°F | Resp 12 | Ht 64.0 in | Wt 129.0 lb

## 2012-03-07 DIAGNOSIS — Z0000 Encounter for general adult medical examination without abnormal findings: Secondary | ICD-10-CM

## 2012-03-07 DIAGNOSIS — Z23 Encounter for immunization: Secondary | ICD-10-CM

## 2012-03-07 DIAGNOSIS — Z299 Encounter for prophylactic measures, unspecified: Secondary | ICD-10-CM

## 2012-03-07 MED ORDER — LISINOPRIL-HYDROCHLOROTHIAZIDE 20-12.5 MG PO TABS: 1.0000 | ORAL_TABLET | Freq: Every day | ORAL | Status: DC

## 2012-03-07 MED ORDER — ESTRADIOL 0.5 MG PO TABS: 0.5000 mg | ORAL_TABLET | Freq: Every day | ORAL | Status: DC

## 2012-03-07 MED ORDER — ZOSTER VACCINE LIVE 19400 UNT/0.65ML ~~LOC~~ SOLR: 0.6500 mL | Freq: Once | SUBCUTANEOUS | Status: DC

## 2012-03-07 MED ORDER — PRAVASTATIN SODIUM 40 MG PO TABS: 40.0000 mg | ORAL_TABLET | Freq: Every day | ORAL | Status: DC

## 2012-03-07 NOTE — Patient Instructions (Addendum)
Consider baseline DEXA scan this year.

## 2012-03-07 NOTE — Progress Notes (Signed)
  Subjective:    Patient ID: Bridget Strong, female    DOB: [birth year], 61 y.o.   MRN: [id number]  HPI  Patient seen for complete physical. She continues to see gynecologist. Previous hysterectomy for benign disease. She is maintained on low-dose Estrace. She's had hot flashes when she tried to come off this. She has hyperlipidemia treated pravastatin. Hypertension treated with lisinopril HCTZ. Blood pressure well controlled. Somewhat inconsistent exercise. Tetanus up-to-date. Needs shingles vaccine. Needs flu vaccine. Colonoscopy up to date.  Past Medical History  Diagnosis Date  . Anemia   . Chicken pox   . Hyperlipidemia   . Hypertension    Past Surgical History  Procedure Date  . Cholecystectomy 2004  . Abdominal hysterectomy 1998    reports that she has never smoked. She does not have any smokeless tobacco history on file. Her alcohol and drug histories not on file. family history includes Alcohol abuse in her father and other; Arthritis in her other; Hypertension in her other; and Stroke in her father and other. Allergies  Allergen Reactions  . Hydrocodone     Nausea, GI upset     Review of Systems  Constitutional: Negative for fever, activity change, appetite change, fatigue and unexpected weight change.  HENT: Negative for hearing loss, ear pain, sore throat and trouble swallowing.   Eyes: Negative for visual disturbance.  Respiratory: Negative for cough and shortness of breath.   Cardiovascular: Negative for chest pain and palpitations.  Gastrointestinal: Negative for abdominal pain, diarrhea, constipation and blood in stool.  Genitourinary: Negative for dysuria and hematuria.  Musculoskeletal: Negative for myalgias, back pain and arthralgias.  Skin: Negative for rash.  Neurological: Negative for dizziness, syncope and headaches.  Hematological: Negative for adenopathy.  Psychiatric/Behavioral: Negative for confusion and dysphoric mood.       Objective:   Physical Exam  Constitutional: She is oriented to person, place, and time. She appears well-developed and well-nourished.  HENT:  Head: Normocephalic and atraumatic.  Eyes: EOM are normal. Pupils are equal, round, and reactive to light.  Neck: Normal range of motion. Neck supple. No thyromegaly present.  Cardiovascular: Normal rate, regular rhythm and normal heart sounds.   No murmur heard. Pulmonary/Chest: Breath sounds normal. No respiratory distress. She has no wheezes. She has no rales.  Abdominal: Soft. Bowel sounds are normal. She exhibits no distension and no mass. There is no tenderness. There is no rebound and no guarding.  Genitourinary:       Per gyn   Musculoskeletal: Normal range of motion. She exhibits no edema.  Lymphadenopathy:    She has no cervical adenopathy.  Neurological: She is alert and oriented to person, place, and time. She displays normal reflexes. No cranial nerve deficit.  Skin: No rash noted.  Psychiatric: She has a normal mood and affect. Her behavior is normal. Judgment and thought content normal.          Assessment & Plan:  Complete physical. Labs reviewed with patient and favorable. Shingles vaccine given. Flu vaccine given. Consider baseline DEXA scan. Continue yearly mammogram. Refills of medication given for one year.

## 2012-04-23 ENCOUNTER — Other Ambulatory Visit: Payer: Self-pay | Admitting: Obstetrics and Gynecology

## 2012-11-10 ENCOUNTER — Ambulatory Visit: Payer: 59 | Admitting: Family Medicine

## 2012-11-10 ENCOUNTER — Encounter: Payer: Self-pay | Admitting: Family Medicine

## 2012-11-10 ENCOUNTER — Ambulatory Visit (INDEPENDENT_AMBULATORY_CARE_PROVIDER_SITE_OTHER): Payer: 59 | Admitting: Family Medicine

## 2012-11-10 VITALS — BP 126/64 | HR 70 | Temp 98.2°F | Ht 64.0 in | Wt 126.0 lb

## 2012-11-10 DIAGNOSIS — J019 Acute sinusitis, unspecified: Secondary | ICD-10-CM

## 2012-11-10 MED ORDER — AMOXICILLIN-POT CLAVULANATE 875-125 MG PO TABS: 1.0000 | ORAL_TABLET | Freq: Two times a day (BID) | ORAL | Status: DC

## 2012-11-10 NOTE — Patient Instructions (Addendum)
Follow up for any fever or worsening symptoms. 

## 2012-11-10 NOTE — Progress Notes (Signed)
  Subjective:    Patient ID: Bridget Strong, female    DOB: [birth year], 62 y.o.   MRN: [id number]  HPI Acute visit Patient seen with onset of cold-like symptoms over 2 weeks ago.  Has some persistent cough and last Thursday developed some low-grade fever -slightly over 100F. She has increased fatigue. Left maxillary sinus pressure and upper teeth pain. No significant colored nasal discharge. She's had some mild headaches but no neck stiffness. No tick bites. No skin rash. No dysuria. Using Mucinex without improvement  Past Medical History  Diagnosis Date  . Anemia   . Chicken pox   . Hyperlipidemia   . Hypertension    Past Surgical History  Procedure Laterality Date  . Cholecystectomy  2004  . Abdominal hysterectomy  1998    reports that she has never smoked. She does not have any smokeless tobacco history on file. Her alcohol and drug histories are not on file. family history includes Alcohol abuse in her father and other; Arthritis in her other; Hypertension in her other; and Stroke in her father and other. Allergies  Allergen Reactions  . Hydrocodone     Nausea, GI upset      Review of Systems  Constitutional: Positive for chills and fatigue. Negative for fever.  HENT: Positive for congestion. Negative for ear pain.   Respiratory: Positive for cough. Negative for shortness of breath and wheezing.   Cardiovascular: Negative for chest pain.  Neurological: Positive for headaches.       Objective:   Physical Exam  Constitutional: She appears well-developed and well-nourished.  HENT:  Right Ear: External ear normal.  Left Ear: External ear normal.  Mouth/Throat: Oropharynx is clear and moist.  Left naris reveals some mild erythema. No purulent secretions  Neck: Neck supple.  Cardiovascular: Normal rate and regular rhythm.   Pulmonary/Chest: Effort normal and breath sounds normal. No respiratory distress. She has no wheezes. She has no rales.           Assessment & Plan:  Probable acute left maxillary sinusitis. Concerning is that she has low-grade fever approximately 10 days into typical viral illness. Start Augmentin 875 mg twice daily for 10 days. Followup promptly if she has any recurrent fever or worsening symptoms

## 2012-12-11 ENCOUNTER — Telehealth: Payer: Self-pay | Admitting: Family Medicine

## 2012-12-11 NOTE — Telephone Encounter (Addendum)
Pt has another sinus inf and had [facility] [place] fax over a refill for augmentin abx. Pt is aware md out of office. Pt would like to see if another md will authorize refill

## 2012-12-12 NOTE — Telephone Encounter (Signed)
Left message for patient to call back, patient was last seen on 11/10/12 for sinus inf. Pt can set up appointment to be seen by another physician.

## 2012-12-13 ENCOUNTER — Other Ambulatory Visit: Payer: Self-pay | Admitting: Family Medicine

## 2012-12-15 NOTE — Telephone Encounter (Signed)
Pt decline appt. Pt is using otc

## 2012-12-15 NOTE — Telephone Encounter (Signed)
Refill once.  Needs office follow up if no better after that.

## 2012-12-25 ENCOUNTER — Encounter: Payer: Self-pay | Admitting: Family Medicine

## 2012-12-25 ENCOUNTER — Ambulatory Visit (INDEPENDENT_AMBULATORY_CARE_PROVIDER_SITE_OTHER): Payer: 59 | Admitting: Family Medicine

## 2012-12-25 VITALS — BP 126/72 | HR 86 | Temp 98.1°F | Wt 126.0 lb

## 2012-12-25 DIAGNOSIS — J320 Chronic maxillary sinusitis: Secondary | ICD-10-CM

## 2012-12-25 MED ORDER — LEVOFLOXACIN 500 MG PO TABS: 500.0000 mg | ORAL_TABLET | Freq: Every day | ORAL | Status: DC

## 2012-12-25 NOTE — Patient Instructions (Addendum)

## 2012-12-25 NOTE — Progress Notes (Signed)
  Subjective:    Patient ID: Bridget Strong, female    DOB: [birth year], 62 y.o.   MRN: [id number]  HPI  Acute febrile illness. Patient was seen end of June and treated for probable acute sinusitis. She was treated with Augmentin and did see some initial improvement. Over the past couple of weeks she's had some progressive nasal congestion and left maxillary facial pain. Past 2 nights she has had night sweats and chills and low-grade fever. No colored nasal discharge. Some postnasal drip. Productive cough. Denies urinary symptoms. She has significant increased malaise over baseline. Denies any nausea or vomiting. Intermittent mild headaches.  Past Medical History  Diagnosis Date  . Anemia   . Chicken pox   . Hyperlipidemia   . Hypertension    Past Surgical History  Procedure Laterality Date  . Cholecystectomy  2004  . Abdominal hysterectomy  1998    reports that she has never smoked. She does not have any smokeless tobacco history on file. Her alcohol and drug histories are not on file. family history includes Alcohol abuse in her father and other; Arthritis in her other; Hypertension in her other; Stroke in her father and other. Allergies  Allergen Reactions  . Hydrocodone     Nausea, GI upset      Review of Systems  Constitutional: Positive for fever, chills and fatigue.  HENT: Positive for congestion and sinus pressure.   Respiratory: Positive for cough.   Gastrointestinal: Negative for nausea and vomiting.  Neurological: Positive for headaches.       Objective:   Physical Exam  Constitutional: She appears well-developed and well-nourished.  HENT:  Right Ear: External ear normal.  Left Ear: External ear normal.  Nose: Nose normal.  Mouth/Throat: Oropharynx is clear and moist.  Neck: Neck supple.  Cardiovascular: Normal rate and regular rhythm.   Pulmonary/Chest: Effort normal and breath sounds normal. No respiratory distress. She has no wheezes. She has no  rales.          Assessment & Plan:  Recurrent versus chronic sinusitis. Given duration of symptoms and reported fever and chills past couple nights, start Levaquin 500 milligrams once daily for 10 days. Consider limited CT sinuses if no better in one to 2 weeks

## 2013-03-03 ENCOUNTER — Other Ambulatory Visit (INDEPENDENT_AMBULATORY_CARE_PROVIDER_SITE_OTHER): Payer: 59

## 2013-03-03 DIAGNOSIS — Z0000 Encounter for general adult medical examination without abnormal findings: Secondary | ICD-10-CM

## 2013-03-03 LAB — POCT URINALYSIS DIPSTICK
Bilirubin, UA: NEGATIVE
Glucose, UA: NEGATIVE
Leukocytes, UA: NEGATIVE
Nitrite, UA: NEGATIVE

## 2013-03-03 LAB — CBC WITH DIFFERENTIAL/PLATELET
Basophils Relative: 0.4 % (ref 0.0–3.0)
Eosinophils Absolute: 0.1 10*3/uL (ref 0.0–0.7)
MCHC: 34.6 g/dL (ref 30.0–36.0)
MCV: 90.3 fl (ref 78.0–100.0)
Monocytes Absolute: 0.4 10*3/uL (ref 0.1–1.0)
Neutro Abs: 2.3 10*3/uL (ref 1.4–7.7)
Neutrophils Relative %: 45.9 % (ref 43.0–77.0)
RBC: 3.99 Mil/uL (ref 3.87–5.11)

## 2013-03-03 LAB — BASIC METABOLIC PANEL
BUN: 17 mg/dL (ref 6–23)
CO2: 26 mEq/L (ref 19–32)
Chloride: 103 mEq/L (ref 96–112)
Creatinine, Ser: 0.8 mg/dL (ref 0.4–1.2)

## 2013-03-03 LAB — TSH: TSH: 3.13 u[IU]/mL (ref 0.35–5.50)

## 2013-03-03 LAB — LIPID PANEL
Total CHOL/HDL Ratio: 3
Triglycerides: 122 mg/dL (ref 0.0–149.0)

## 2013-03-03 LAB — HEPATIC FUNCTION PANEL
Bilirubin, Direct: 0 mg/dL (ref 0.0–0.3)
Total Bilirubin: 0.7 mg/dL (ref 0.3–1.2)

## 2013-03-09 ENCOUNTER — Encounter: Payer: 59 | Admitting: Family Medicine

## 2013-03-16 ENCOUNTER — Ambulatory Visit (INDEPENDENT_AMBULATORY_CARE_PROVIDER_SITE_OTHER): Payer: 59 | Admitting: Family Medicine

## 2013-03-16 ENCOUNTER — Encounter: Payer: Self-pay | Admitting: Family Medicine

## 2013-03-16 VITALS — BP 130/64 | HR 63 | Temp 97.9°F | Wt 126.0 lb

## 2013-03-16 DIAGNOSIS — Z0000 Encounter for general adult medical examination without abnormal findings: Secondary | ICD-10-CM

## 2013-03-16 DIAGNOSIS — Z23 Encounter for immunization: Secondary | ICD-10-CM

## 2013-03-16 MED ORDER — PRAVASTATIN SODIUM 40 MG PO TABS: 40.0000 mg | ORAL_TABLET | Freq: Every day | ORAL | Status: DC

## 2013-03-16 MED ORDER — LISINOPRIL-HYDROCHLOROTHIAZIDE 20-12.5 MG PO TABS: 1.0000 | ORAL_TABLET | Freq: Every day | ORAL | Status: DC

## 2013-03-16 MED ORDER — ESTRADIOL 0.5 MG PO TABS: 0.5000 mg | ORAL_TABLET | Freq: Every day | ORAL | Status: DC

## 2013-03-16 NOTE — Progress Notes (Signed)
  Subjective:    Patient ID: Bridget Strong, female    DOB: 1950/09/29, 62 y.o.   MRN: [id number]  HPI  Patient seen for complete physical. She continues to see gynecologist. She has hypertension which is treated with lisinopril HCTZ and hyperlipidemia on pravastatin. She remains on low-dose estradiol.  Takes regular calcium and vitamin D. She walks regularly for exercise. Immunizations up to date with exception of needs flu vaccine. Colonoscopy up to date. No recent bone density scan. She will discuss this with her gynecologist. Nonsmoker.  Past Medical History  Diagnosis Date  . Anemia   . Chicken pox   . Hyperlipidemia   . Hypertension    Past Surgical History  Procedure Laterality Date  . Cholecystectomy  2004  . Abdominal hysterectomy  1998    reports that she has never smoked. She does not have any smokeless tobacco history on file. Her alcohol and drug histories are not on file. family history includes Alcohol abuse in her father and other; Arthritis in her other; Hypertension in her other; Stroke in her father and other. Allergies  Allergen Reactions  . Hydrocodone     Nausea, GI upset      Review of Systems  Constitutional: Negative for fever, activity change, appetite change, fatigue and unexpected weight change.  HENT: Negative for ear pain, hearing loss, sore throat and trouble swallowing.   Eyes: Negative for visual disturbance.  Respiratory: Negative for cough and shortness of breath.   Cardiovascular: Negative for chest pain and palpitations.  Gastrointestinal: Negative for abdominal pain, diarrhea, constipation and blood in stool.  Genitourinary: Negative for dysuria and hematuria.  Musculoskeletal: Negative for arthralgias, back pain and myalgias.  Skin: Negative for rash.  Neurological: Negative for dizziness, syncope and headaches.  Hematological: Negative for adenopathy.  Psychiatric/Behavioral: Negative for confusion and dysphoric mood.        Objective:   Physical Exam  Constitutional: She is oriented to person, place, and time. She appears well-developed and well-nourished.  HENT:  Head: Normocephalic and atraumatic.  Eyes: EOM are normal. Pupils are equal, round, and reactive to light.  Neck: Normal range of motion. Neck supple. No thyromegaly present.  Cardiovascular: Normal rate, regular rhythm and normal heart sounds.   No murmur heard. Pulmonary/Chest: Breath sounds normal. No respiratory distress. She has no wheezes. She has no rales.  Abdominal: Soft. Bowel sounds are normal. She exhibits no distension and no mass. There is no tenderness. There is no rebound and no guarding.  Genitourinary:  Per GYN  Musculoskeletal: Normal range of motion. She exhibits no edema.  Lymphadenopathy:    She has no cervical adenopathy.  Neurological: She is alert and oriented to person, place, and time. She displays normal reflexes. No cranial nerve deficit.  Skin: No rash noted.  Psychiatric: She has a normal mood and affect. Her behavior is normal. Judgment and thought content normal.          Assessment & Plan:  Complete physical. Labs reviewed with patient. She has minimally elevated glucose 105. No prior history prediabetes. Refill medications for one year. Continue regular weightbearing exercise and adequate calcium and vitamin D intake. Flu vaccine given.

## 2013-10-24 DIAGNOSIS — S42309A Unspecified fracture of shaft of humerus, unspecified arm, initial encounter for closed fracture: Secondary | ICD-10-CM

## 2013-10-24 HISTORY — DX: Unspecified fracture of shaft of humerus, unspecified arm, initial encounter for closed fracture: S42.309A

## 2013-11-04 ENCOUNTER — Telehealth: Payer: Self-pay | Admitting: Obstetrics and Gynecology

## 2013-11-09 ENCOUNTER — Ambulatory Visit (INDEPENDENT_AMBULATORY_CARE_PROVIDER_SITE_OTHER): Payer: 59 | Admitting: Obstetrics and Gynecology

## 2013-11-09 ENCOUNTER — Encounter: Payer: Self-pay | Admitting: Obstetrics and Gynecology

## 2013-11-09 VITALS — BP 122/70 | HR 60 | Resp 18 | Ht 64.0 in | Wt 124.0 lb

## 2013-11-09 DIAGNOSIS — N8111 Cystocele, midline: Secondary | ICD-10-CM

## 2013-11-09 DIAGNOSIS — IMO0002 Reserved for concepts with insufficient information to code with codable children: Secondary | ICD-10-CM

## 2013-11-09 DIAGNOSIS — N993 Prolapse of vaginal vault after hysterectomy: Secondary | ICD-10-CM

## 2013-11-09 LAB — POCT URINALYSIS DIPSTICK
BILIRUBIN UA: NEGATIVE
Blood, UA: NEGATIVE
Glucose, UA: NEGATIVE
KETONES UA: NEGATIVE
Leukocytes, UA: NEGATIVE
Nitrite, UA: NEGATIVE
PH UA: 5
Protein, UA: NEGATIVE
Urobilinogen, UA: NEGATIVE

## 2013-11-09 NOTE — Patient Instructions (Signed)
Please have your orthopedic physician give me an update about when you could proceed with surgery for pelvic organ prolapse.

## 2013-11-09 NOTE — Progress Notes (Signed)
Patient ID: Bridget Strong, female   DOB: [birth year], 63 y.o.   MRN: [id number] GYNECOLOGY VISIT  PCP:  Carolann Littler, MD  Referring provider:  Gus Height, MD  HPI: 63 y.o.   Married  Caucasian  female   [phone] with Patient's last menstrual period was 05/14/1996.   here for evaluation of prolapse.   Interested in surgery.   Onset of prolapse in August 2014.  Having frequent urination. Uncertain if emptying completely. No urinary leakage ever.  No problems with bowel movements.  States vaginal is totally prolapsed to the outside.   No prior urodynamics testing.   Used to do running but has given it up due to the prolapse.   Status post total vaginal hysterectomy with cystocele and rectocele repair in 1998 - Dr. Harrington Challenger.  States that prolapse was fairly advanced at that time.  October 24, 2013 [place] at beach and had compound fracture of the humerus - Dr. Jerilynn Som.  Wearing an arm brace and this wraps across her chest.  Prognosis is good per patient.  Has a follow up this week.  Unable to use much right now at all.  Arm rests at her side all of the time.  Has facial bruising and right black eye.  Had an MIR of head and spine which were normal.   Patient is on medical leave from work due to this injury and is hoping to have her prolapse surgery while she in on leave.     Urine:  Neg  GYNECOLOGIC HISTORY: Patient's last menstrual period was 05/14/1996. Sexually active:  yes Partner preference: female Contraception:  Hysterectomy-ovaries remain  Menopausal hormone therapy: Estradiol 0.5mg  DES exposure:   no Blood transfusions: no   Sexually transmitted diseases: no   GYN procedures and prior surgeries:  TVH--ovaries remain Last mammogram:  04/2013 normal:[facility]             Last pap and high risk HPV testing:  2013 wnl  History of abnormal pap smear:  no   OB History   Grav Para Term Preterm Abortions TAB SAB Ect Mult Living   4 3 3  1  1   3     Largest was 8 pounds and  2 ounces.  All vagina deliveries.    LIFESTYLE: Exercise:   no          Tobacco:    no Alcohol:     no Drug use:  no  Patient Active Problem List   Diagnosis Date Noted  . BACTERIAL PNEUMONIA 06/13/2010  . FEVER UNSPECIFIED 06/09/2010  . HYPERLIPIDEMIA 02/09/2009  . HYPERTENSION 02/09/2009    Past Medical History  Diagnosis Date  . Chicken pox   . Hyperlipidemia   . Hypertension   . Anemia     with pregnancy    Past Surgical History  Procedure Laterality Date  . Cholecystectomy  2004  . Abdominal hysterectomy  1998    TVH--ovaries remain    Current Outpatient Prescriptions  Medication Sig Dispense Refill  . Calcium Carbonate-Vitamin D (CALCIUM-VITAMIN D) 600-200 MG-UNIT CAPS Take by mouth daily.        Marland Kitchen estradiol (ESTRACE) 0.5 MG tablet Take 1 tablet (0.5 mg total) by mouth daily.  90 tablet  3  . lisinopril-hydrochlorothiazide (PRINZIDE,ZESTORETIC) 20-12.5 MG per tablet Take 1 tablet by mouth daily.  90 tablet  3  . pravastatin (PRAVACHOL) 40 MG tablet Take 1 tablet (40 mg total) by mouth daily.  90 tablet  3  . vitamin  C (ASCORBIC ACID) 500 MG tablet Take 500 mg by mouth daily.         No current facility-administered medications for this visit.     ALLERGIES: Hydrocodone  Family History  Problem Relation Age of Onset  . Alcohol abuse Other   . Arthritis Other   . Hypertension Other   . Stroke Other   . Stroke Father   . Alcohol abuse Father   . Breast cancer Maternal Grandmother 28  . Breast cancer Paternal Grandmother 81    History   Social History  . Marital Status: Married    Spouse Name: N/A    Number of Children: N/A  . Years of Education: N/A   Occupational History  . Not on file.   Social History Main Topics  . Smoking status: Never Smoker   . Smokeless tobacco: Not on file  . Alcohol Use: No  . Drug Use: No  . Sexual Activity: Yes    Partners: Male    Birth Control/ Protection: Surgical     Comment: TVH-ovaries remain   Other  Topics Concern  . Not on file   Social History Narrative  . No narrative on file    ROS:  Pertinent items are noted in HPI.  PHYSICAL EXAMINATION:    BP 122/70  Pulse 60  Resp 18  Ht 5\' 4"  (1.626 m)  Wt 124 lb (56.246 kg)  BMI 21.27 kg/m2  LMP 05/14/1996   Wt Readings from Last 3 Encounters:  11/09/13 124 lb (56.246 kg)  03/16/13 126 lb (57.153 kg)  12/25/12 126 lb (57.153 kg)     Ht Readings from Last 3 Encounters:  11/09/13 5\' 4"  (1.626 m)  11/10/12 5\' 4"  (1.626 m)  03/07/12 5\' 4"  (1.626 m)    General appearance: alert, cooperative and appears stated age.  Patient is thin. Head: Normocephalic, without obvious abnormality, atraumatic Neck: no adenopathy, supple, symmetrical, trachea midline and thyroid not enlarged, symmetric, no tenderness/mass/nodules Lungs: clear to auscultation bilaterally Heart: regular rate and rhythm Abdomen: soft, non-tender; no masses,  no organomegaly Extremities: plastic cast right arm.  Skin: Bruising of the right arm and the right face Lymph nodes: Cervical, supraclavicular, and axillary nodes normal. No abnormal inguinal nodes palpated    Pelvic: External genitalia:  no lesions              Urethra:  normal appearing urethra with no masses, tenderness or lesions              Bartholins and Skenes: normal                 Vagina: normal appearing vagina with normal color and discharge, no lesions. Complete vault prolapse. Ischial spine difficult to feel well.  Vagina is a little shortened.               Cervix:  Absent.                 Bimanual Exam:  Uterus:   Absent.                                       Adnexa: normal adnexa in size, nontender and no masses  Rectovaginal: Confirms                                      Anus:  normal sphincter tone, no lesions  ASSESSMENT  Status post TVH with anterior and posterior colporrhaphy for uterovaginal prolapse. Ovaries remain.  Complete vaginal vault  prolapse.  PLAN  Needs urodynamics with reduction of prolapse if proceeds with a vaginal prolapse repair. If will have an abdominal surgery, this is not needed as she will have an automatic midurethral sling.   I have had a comprehensive discussion with the patient regarding prolapse.  I have provided reading materials from [employer] regarding prolapse in general as well as medical and surgical treatment for these conditions.   Medical treatments may include pessary use.   We discussed multiple routes of approach to surgery including: - abdominal sacrocolpopexy with permanent graft andTVT Exact midurethral sling and cystoscopy, and possible anterior and posterior colporrhaphy. - vaginal approach with anterior and posterior colporrhaphy with possible sacrospinous fixation using native tissue repair and TVT midurethral sling and cystoscopy.   We discussed benefits and risks of surgery which include but are not limited to bleeding, infection, damage to surrounding organs, ureteral damage, vaginal pain with intercourse, mesh erosion and exposure, dyspareunia, urinary retention and need for prolonged catheterization and/or self catheterization, reoperation, recurrence of prolapse and incontinence,  DVT, PE, death, and reaction to anesthesia.    I have discussed surgical expectations regarding the procedures and success rates, outcomes, and recovery.     Patient will discuss with her orthopedic surgeon her limitations in terms of mobility of the arm and going through a major surgery.   Patient will then call back with her choice.    An After Visit Summary was printed and given to the patient.  60 minutes face to face time of which over 50% was spent in counseling.

## 2013-11-12 ENCOUNTER — Telehealth: Payer: Self-pay | Admitting: Obstetrics and Gynecology

## 2013-11-12 NOTE — Telephone Encounter (Signed)
Spoke with patient. Appointment scheduled for pessary fitting on July 27th at [facility] with Dr.Silva. Patient agreeable to date and time.  Routing to provider for final review. Patient agreeable to disposition. Will close encounter

## 2013-11-12 NOTE — Telephone Encounter (Signed)
Please schedule patient for a pessary fitting.  Thank you!

## 2013-11-12 NOTE — Telephone Encounter (Signed)
Dr.Silva, patient was seen on 6/29 for discussion of surgery for cystocele. Patient was to review with orthopedic surgeon due to arm injury and call back with decision. Patient would like to have a pessary inserted at this time. Pessaries were discussed at consult on 6/29. Okay to schedule patient for pessary insertion at this time?

## 2013-11-12 NOTE — Telephone Encounter (Signed)
Patient has decided she is ready to have a pessary inserted.

## 2013-12-02 ENCOUNTER — Ambulatory Visit: Payer: 59 | Admitting: Obstetrics and Gynecology

## 2013-12-07 ENCOUNTER — Ambulatory Visit (INDEPENDENT_AMBULATORY_CARE_PROVIDER_SITE_OTHER): Payer: 59 | Admitting: Obstetrics and Gynecology

## 2013-12-07 ENCOUNTER — Ambulatory Visit: Payer: 59 | Admitting: Obstetrics and Gynecology

## 2013-12-07 ENCOUNTER — Encounter: Payer: Self-pay | Admitting: Obstetrics and Gynecology

## 2013-12-07 VITALS — BP 126/68 | HR 80 | Resp 16 | Ht 64.0 in | Wt 126.6 lb

## 2013-12-07 DIAGNOSIS — N993 Prolapse of vaginal vault after hysterectomy: Secondary | ICD-10-CM

## 2013-12-07 MED ORDER — ESTRADIOL 0.1 MG/GM VA CREA: TOPICAL_CREAM | VAGINAL | Status: DC

## 2013-12-07 NOTE — Progress Notes (Signed)
Patient ID: Bridget Strong, female   DOB: [birth year], 63 y.o.   MRN: [id number] GYNECOLOGY  VISIT   HPI: 63 y.o.   Married  Caucasian  female   [phone] with Patient's last menstrual period was 05/14/1996.   here for   Pessary fitting.  Has right arm problems.  Just started physical therapy 2 weeks ago.  Would like to try a pessary until able to consider possible surgery.   Not very sexually active.   GYNECOLOGIC HISTORY: Patient's last menstrual period was 05/14/1996. Contraception:   TVH--ovaries remain Menopausal hormone therapy: Estradiol        OB History   Grav Para Term Preterm Abortions TAB SAB Ect Mult Living   4 3 3  1  1   3          Patient Active Problem List   Diagnosis Date Noted  . Vaginal vault prolapse after hysterectomy 11/09/2013  . BACTERIAL PNEUMONIA 06/13/2010  . FEVER UNSPECIFIED 06/09/2010  . HYPERLIPIDEMIA 02/09/2009  . HYPERTENSION 02/09/2009    Past Medical History  Diagnosis Date  . Chicken pox   . Hyperlipidemia   . Hypertension   . Anemia     with pregnancy  . Broken humerus 10-24-13    Past Surgical History  Procedure Laterality Date  . Cholecystectomy  2004  . Abdominal hysterectomy  1998    TVH--ovaries remain    Current Outpatient Prescriptions  Medication Sig Dispense Refill  . Calcium Carbonate-Vitamin D (CALCIUM-VITAMIN D) 600-200 MG-UNIT CAPS Take by mouth daily.        Marland Kitchen estradiol (ESTRACE) 0.5 MG tablet Take 1 tablet (0.5 mg total) by mouth daily.  90 tablet  3  . lisinopril-hydrochlorothiazide (PRINZIDE,ZESTORETIC) 20-12.5 MG per tablet Take 1 tablet by mouth daily.  90 tablet  3  . pravastatin (PRAVACHOL) 40 MG tablet Take 1 tablet (40 mg total) by mouth daily.  90 tablet  3  . vitamin C (ASCORBIC ACID) 500 MG tablet Take 500 mg by mouth daily.         No current facility-administered medications for this visit.     ALLERGIES: Hydrocodone  Family History  Problem Relation Age of Onset  . Alcohol abuse Other   .  Arthritis Other   . Hypertension Other   . Stroke Other   . Stroke Father   . Alcohol abuse Father   . Breast cancer Maternal Grandmother 23  . Breast cancer Paternal Grandmother 50    History   Social History  . Marital Status: Married    Spouse Name: N/A    Number of Children: N/A  . Years of Education: N/A   Occupational History  . Not on file.   Social History Main Topics  . Smoking status: Never Smoker   . Smokeless tobacco: Not on file  . Alcohol Use: No  . Drug Use: No  . Sexual Activity: Yes    Partners: Male    Birth Control/ Protection: Surgical     Comment: TVH-ovaries remain   Other Topics Concern  . Not on file   Social History Narrative  . No narrative on file    ROS:  Pertinent items are noted in HPI.  PHYSICAL EXAMINATION:    BP 126/68  Pulse 80  Resp 16  Ht 5\' 4"  (1.626 m)  Wt 126 lb 9.6 oz (57.425 kg)  BMI 21.72 kg/m2  LMP 05/14/1996     General appearance: alert, cooperative and appears stated age Lungs: clear to auscultation  bilaterally Heart: regular rate and rhythm Abdomen: soft, non-tender; no masses,  no organomegaly No abnormal inguinal nodes palpated  Pelvic: External genitalia:  no lesions              Urethra:  normal appearing urethra with no masses, tenderness or lesions              Bartholins and Skenes: normal                 Vagina: normal appearing vagina with normal color and discharge, no lesions.  Complete vault prolapse.               Cervix:  absent                   Bimanual Exam:  Uterus:  uterus is absent.                                      Adnexa: normal adnexa in size, nontender and no masses                                      #6 pessary with support fitted.  Felt better than Gelhorn 2 3/4 inches.  ASSESSMENT  Complete vaginal vault prolapse.    PLAN  Successful pessary fitting with #6 ring with support.  Patient about to do maneuvers and pessary remained in place and was comfortable.    An  After Visit Summary was printed and given to the patient.  __25____ minutes face to face time of which over 50% was spent in counseling.

## 2013-12-16 ENCOUNTER — Telehealth: Payer: Self-pay

## 2013-12-16 NOTE — Telephone Encounter (Signed)
Appointment made 12/21/13 @ 1:00 per Estill Bamberg. Pt informed

## 2013-12-16 NOTE — Telephone Encounter (Signed)
LMOM to contact office for an appointment for pessary insertion

## 2013-12-17 ENCOUNTER — Telehealth: Payer: Self-pay

## 2013-12-17 NOTE — Telephone Encounter (Signed)
Spoke with patient and moved appt to 12:00pm on 12-21-13 from 1:00pm.

## 2013-12-17 NOTE — Telephone Encounter (Signed)
LMOVM AT wk # and Cell #  To call office regarding appt for pessary fitting(we have received pessary and pt. Scheduled on 12-21-13 at 1:00pm and need to move up to 12:00 on same day).

## 2013-12-21 ENCOUNTER — Encounter: Payer: Self-pay | Admitting: Obstetrics and Gynecology

## 2013-12-21 ENCOUNTER — Ambulatory Visit (INDEPENDENT_AMBULATORY_CARE_PROVIDER_SITE_OTHER): Payer: 59 | Admitting: Obstetrics and Gynecology

## 2013-12-21 VITALS — BP 140/68 | HR 68 | Ht 64.0 in | Wt 128.0 lb

## 2013-12-21 DIAGNOSIS — N993 Prolapse of vaginal vault after hysterectomy: Secondary | ICD-10-CM

## 2013-12-21 NOTE — Progress Notes (Signed)
GYNECOLOGY  VISIT   HPI: 63 y.o.   Married  Caucasian  female   [phone] with Patient's last menstrual period was 05/14/1996.   here for   Pessary insertion  GYNECOLOGIC HISTORY: Patient's last menstrual period was 05/14/1996. Contraception:   no Menopausal hormone therapy: estradiol cream and tablets        OB History   Grav Para Term Preterm Abortions TAB SAB Ect Mult Living   4 3 3  1  1   3          Patient Active Problem List   Diagnosis Date Noted  . Vaginal vault prolapse after hysterectomy 11/09/2013  . BACTERIAL PNEUMONIA 06/13/2010  . FEVER UNSPECIFIED 06/09/2010  . HYPERLIPIDEMIA 02/09/2009  . HYPERTENSION 02/09/2009    Past Medical History  Diagnosis Date  . Chicken pox   . Hyperlipidemia   . Hypertension   . Anemia     with pregnancy  . Broken humerus 10-24-13    Past Surgical History  Procedure Laterality Date  . Cholecystectomy  2004  . Abdominal hysterectomy  1998    TVH--ovaries remain    Current Outpatient Prescriptions  Medication Sig Dispense Refill  . Calcium Carbonate-Vitamin D (CALCIUM-VITAMIN D) 600-200 MG-UNIT CAPS Take by mouth daily.        Marland Kitchen estradiol (ESTRACE) 0.1 MG/GM vaginal cream Use 1/2 g vaginally two times per week.  42.5 g  2  . estradiol (ESTRACE) 0.5 MG tablet Take 1 tablet (0.5 mg total) by mouth daily.  90 tablet  3  . lisinopril-hydrochlorothiazide (PRINZIDE,ZESTORETIC) 20-12.5 MG per tablet Take 1 tablet by mouth daily.  90 tablet  3  . pravastatin (PRAVACHOL) 40 MG tablet Take 1 tablet (40 mg total) by mouth daily.  90 tablet  3  . vitamin C (ASCORBIC ACID) 500 MG tablet Take 500 mg by mouth daily.         No current facility-administered medications for this visit.     ALLERGIES: Hydrocodone  Family History  Problem Relation Age of Onset  . Alcohol abuse Other   . Arthritis Other   . Hypertension Other   . Stroke Other   . Stroke Father   . Alcohol abuse Father   . Breast cancer Maternal Grandmother 43  .  Breast cancer Paternal Grandmother 29    History   Social History  . Marital Status: Married    Spouse Name: N/A    Number of Children: N/A  . Years of Education: N/A   Occupational History  . Not on file.   Social History Main Topics  . Smoking status: Never Smoker   . Smokeless tobacco: Not on file  . Alcohol Use: No  . Drug Use: No  . Sexual Activity: Yes    Partners: Male    Birth Control/ Protection: Surgical     Comment: TVH-ovaries remain   Other Topics Concern  . Not on file   Social History Narrative  . No narrative on file    ROS:  Pertinent items are noted in HPI.  PHYSICAL EXAMINATION:    BP 140/68  Pulse 68  Ht 5\' 4"  (1.626 m)  Wt 128 lb (58.06 kg)  BMI 21.96 kg/m2  LMP 05/14/1996     General appearance: alert, cooperative and appears stated age Has right arm brace.  Pelvic: External genitalia:  no lesions              Urethra:  normal appearing urethra with no masses, tenderness or lesions  Bartholins and Skenes: normal                 Vagina: normal appearing vagina with normal color and discharge, no lesions.  Complete vault prolapse.  #6 ring with support Premier pessary placed.  Lot number [id number]     ASSESSMENT  Vaginal vault prolapse following hysterectomy.   PLAN  Follow up in 10 days for a recheck.  May need to wait some weeks prior to teaching patient how to remove as she still a brace on her right arm.    An After Visit Summary was printed and given to the patient.  10______ minutes face to face time of which over 50% was spent in counseling.

## 2013-12-31 ENCOUNTER — Ambulatory Visit (INDEPENDENT_AMBULATORY_CARE_PROVIDER_SITE_OTHER): Payer: 59 | Admitting: Obstetrics and Gynecology

## 2013-12-31 ENCOUNTER — Encounter: Payer: Self-pay | Admitting: Obstetrics and Gynecology

## 2013-12-31 VITALS — BP 118/72 | HR 68 | Ht 64.0 in | Wt 127.0 lb

## 2013-12-31 DIAGNOSIS — N7689 Other specified inflammation of vagina and vulva: Secondary | ICD-10-CM

## 2013-12-31 DIAGNOSIS — N72 Inflammatory disease of cervix uteri: Secondary | ICD-10-CM

## 2013-12-31 DIAGNOSIS — N993 Prolapse of vaginal vault after hysterectomy: Secondary | ICD-10-CM

## 2013-12-31 DIAGNOSIS — N765 Ulceration of vagina: Secondary | ICD-10-CM

## 2013-12-31 NOTE — Progress Notes (Signed)
GYNECOLOGY  VISIT   HPI: 63 y.o.   Married  Caucasian  female   [phone] with Patient's last menstrual period was 05/14/1996.   here for   10 days pessary check. Loves it 90% of the time.  Feels good to have the bladder up. No bladder leakage. Bowel movements are difficult.  Has to strain some now.  Some right sided discomfort.  No vaginal bleeding.  Some vaginal drainage for one day.  Has not tried to remove it.   GYNECOLOGIC HISTORY: Patient's last menstrual period was 05/14/1996. Contraception:   NA Menopausal hormone therapy: Estrace Cream        OB History   Grav Para Term Preterm Abortions TAB SAB Ect Mult Living   4 3 3  1  1   3          Patient Active Problem List   Diagnosis Date Noted  . Vaginal vault prolapse after hysterectomy 11/09/2013  . BACTERIAL PNEUMONIA 06/13/2010  . FEVER UNSPECIFIED 06/09/2010  . HYPERLIPIDEMIA 02/09/2009  . HYPERTENSION 02/09/2009    Past Medical History  Diagnosis Date  . Chicken pox   . Hyperlipidemia   . Hypertension   . Anemia     with pregnancy  . Broken humerus 10-24-13    Past Surgical History  Procedure Laterality Date  . Cholecystectomy  2004  . Abdominal hysterectomy  1998    TVH--ovaries remain    Current Outpatient Prescriptions  Medication Sig Dispense Refill  . Calcium Carbonate-Vitamin D (CALCIUM-VITAMIN D) 600-200 MG-UNIT CAPS Take by mouth daily.        Marland Kitchen estradiol (ESTRACE) 0.1 MG/GM vaginal cream Use 1/2 g vaginally two times per week.  42.5 g  2  . estradiol (ESTRACE) 0.5 MG tablet Take 1 tablet (0.5 mg total) by mouth daily.  90 tablet  3  . lisinopril-hydrochlorothiazide (PRINZIDE,ZESTORETIC) 20-12.5 MG per tablet Take 1 tablet by mouth daily.  90 tablet  3  . pravastatin (PRAVACHOL) 40 MG tablet Take 1 tablet (40 mg total) by mouth daily.  90 tablet  3  . vitamin C (ASCORBIC ACID) 500 MG tablet Take 500 mg by mouth daily.         No current facility-administered medications for this visit.      ALLERGIES: Hydrocodone  Family History  Problem Relation Age of Onset  . Alcohol abuse Other   . Arthritis Other   . Hypertension Other   . Stroke Other   . Stroke Father   . Alcohol abuse Father   . Breast cancer Maternal Grandmother 43  . Breast cancer Paternal Grandmother 55    History   Social History  . Marital Status: Married    Spouse Name: N/A    Number of Children: N/A  . Years of Education: N/A   Occupational History  . Not on file.   Social History Main Topics  . Smoking status: Never Smoker   . Smokeless tobacco: Not on file  . Alcohol Use: No  . Drug Use: No  . Sexual Activity: Yes    Partners: Male    Birth Control/ Protection: Surgical     Comment: TVH-ovaries remain   Other Topics Concern  . Not on file   Social History Narrative  . No narrative on file    ROS:  Pertinent items are noted in HPI.  PHYSICAL EXAMINATION:    BP 118/72  Pulse 68  Ht 5\' 4"  (1.626 m)  Wt 127 lb (57.607 kg)  BMI 21.79  kg/m2  LMP 05/14/1996     General appearance: alert, cooperative and appears stated age Brace is off right arm.   Pessary ring with support.removed, cleansed, and place in biobag and given to patient.   Pelvic: External genitalia:  no lesions              Urethra:  normal appearing urethra with no masses, tenderness or lesions              Bartholins and Skenes: normal                 Vagina:  Ulceration of the right lower vaginal wall and the left vaginal apex - both nonbleeding.               Cervix:  absent                   Bimanual Exam:  Uterus:   absent                                      Adnexa: normal adnexa in size, nontender and no masses                                      Rectovaginal:  No.                                                             ASSESSMENT  Post hysterectomy vaginal vault prolapse.  Vaginal ulceration from ring pessary with support.  PLAN  Stop pessary use.  Use vaginal estrogen cream 1/2 gram  per vagina at hs for the next two weeks.  Recheck in two weeks.  Will move toward a surgical repair after vaginal mucosa healed and prepared for surgery and patient has OK from orthopedic surgeon.   An After Visit Summary was printed and given to the patient.  __15____ minutes face to face time of which over Bridget% was spent in counseling.

## 2014-01-14 ENCOUNTER — Ambulatory Visit (INDEPENDENT_AMBULATORY_CARE_PROVIDER_SITE_OTHER): Payer: 59 | Admitting: Obstetrics and Gynecology

## 2014-01-14 ENCOUNTER — Encounter: Payer: Self-pay | Admitting: Obstetrics and Gynecology

## 2014-01-14 VITALS — BP 140/82 | HR 72 | Ht 64.0 in | Wt 129.0 lb

## 2014-01-14 DIAGNOSIS — N993 Prolapse of vaginal vault after hysterectomy: Secondary | ICD-10-CM

## 2014-01-14 NOTE — Progress Notes (Signed)
GYNECOLOGY  VISIT   HPI: 63 y.o.   Married  Caucasian  female   [phone] with Patient's last menstrual period was 05/14/1996.   here for   14 day recheck  Bottom feels OK. Had trace bleeding for 3 days after pessary removed, otherwise not bleeding or drainage from vagina.  No dysuria.  Using estrogen cream nightly since last visit.  Driving now.  Only wears a brace on right arm part time.  GYNECOLOGIC HISTORY: Patient's last menstrual period was 05/14/1996. Contraception:  no  Menopausal hormone therapy: Estradiol Tabs and cream        OB History   Grav Para Term Preterm Abortions TAB SAB Ect Mult Living   4 3 3  1  1   3          Patient Active Problem List   Diagnosis Date Noted  . Vaginal vault prolapse after hysterectomy 11/09/2013  . BACTERIAL PNEUMONIA 06/13/2010  . FEVER UNSPECIFIED 06/09/2010  . HYPERLIPIDEMIA 02/09/2009  . HYPERTENSION 02/09/2009    Past Medical History  Diagnosis Date  . Chicken pox   . Hyperlipidemia   . Hypertension   . Anemia     with pregnancy  . Broken humerus 10-24-13    Past Surgical History  Procedure Laterality Date  . Cholecystectomy  2004  . Abdominal hysterectomy  1998    TVH--ovaries remain    Current Outpatient Prescriptions  Medication Sig Dispense Refill  . Calcium Carbonate-Vitamin D (CALCIUM-VITAMIN D) 600-200 MG-UNIT CAPS Take by mouth daily.        Marland Kitchen estradiol (ESTRACE) 0.1 MG/GM vaginal cream Use 1/2 g vaginally two times per week.  42.5 g  2  . estradiol (ESTRACE) 0.5 MG tablet Take 1 tablet (0.5 mg total) by mouth daily.  90 tablet  3  . lisinopril-hydrochlorothiazide (PRINZIDE,ZESTORETIC) 20-12.5 MG per tablet Take 1 tablet by mouth daily.  90 tablet  3  . pravastatin (PRAVACHOL) 40 MG tablet Take 1 tablet (40 mg total) by mouth daily.  90 tablet  3  . vitamin C (ASCORBIC ACID) 500 MG tablet Take 500 mg by mouth daily.         No current facility-administered medications for this visit.     ALLERGIES:  Hydrocodone  Family History  Problem Relation Age of Onset  . Alcohol abuse Other   . Arthritis Other   . Hypertension Other   . Stroke Other   . Stroke Father   . Alcohol abuse Father   . Breast cancer Maternal Grandmother 60  . Breast cancer Paternal Grandmother 58    History   Social History  . Marital Status: Married    Spouse Name: N/A    Number of Children: N/A  . Years of Education: N/A   Occupational History  . Not on file.   Social History Main Topics  . Smoking status: Never Smoker   . Smokeless tobacco: Not on file  . Alcohol Use: No  . Drug Use: No  . Sexual Activity: Yes    Partners: Male    Birth Control/ Protection: Surgical     Comment: TVH-ovaries remain   Other Topics Concern  . Not on file   Social History Narrative  . No narrative on file    ROS:  Pertinent items are noted in HPI.  PHYSICAL EXAMINATION:    BP 140/82  Pulse 72  Ht 5\' 4"  (1.626 m)  Wt 129 lb (58.514 kg)  BMI 22.13 kg/m2  LMP 05/14/1996  General appearance: alert, cooperative and appears stated age   Pelvic: External genitalia:  no lesions              Urethra:  normal appearing urethra with no masses, tenderness or lesions              Bartholins and Skenes: normal                 Vagina: bilateral vaginal apices with very faint area of ulceration/granulation tissue -  0.5 cm diameter.              Cervix:  absent                   Bimanual Exam:  Uterus:   absent                                      Adnexa:  No masses                                       ASSESSMENT  Post hysterectomy vaginal vault prolapse. Intolerance of pessary which caused ulceration. Recovery from compound fx of right humerus.    PLAN  Continue with Estrace 1/2 gm twice weekly to vagina. Contact the office when desires to proceed with surgical care. Will need urodynamics with reduction of the prolapse.  This procedure was explained to the patient.    An After Visit Summary was  printed and given to the patient.  ___15___ minutes face to face time of which over 50% was spent in counseling.

## 2014-01-15 ENCOUNTER — Encounter: Payer: Self-pay | Admitting: Obstetrics and Gynecology

## 2014-01-15 NOTE — Patient Instructions (Signed)
Call when you are ready to proceed with surgery. I am happy to help! You will need to have the urodynamic testing performed prior to proceeding with surgery.

## 2014-03-10 ENCOUNTER — Other Ambulatory Visit (INDEPENDENT_AMBULATORY_CARE_PROVIDER_SITE_OTHER): Payer: 59

## 2014-03-10 DIAGNOSIS — Z0000 Encounter for general adult medical examination without abnormal findings: Secondary | ICD-10-CM

## 2014-03-10 DIAGNOSIS — E785 Hyperlipidemia, unspecified: Secondary | ICD-10-CM

## 2014-03-10 LAB — CBC WITH DIFFERENTIAL/PLATELET
Basophils Absolute: 0 10*3/uL (ref 0.0–0.1)
Basophils Relative: 0.4 % (ref 0.0–3.0)
Eosinophils Absolute: 0.1 10*3/uL (ref 0.0–0.7)
Eosinophils Relative: 1.2 % (ref 0.0–5.0)
HCT: 36.6 % (ref 36.0–46.0)
Hemoglobin: 12.2 g/dL (ref 12.0–15.0)
Lymphocytes Relative: 43 % (ref 12.0–46.0)
Lymphs Abs: 2.3 10*3/uL (ref 0.7–4.0)
MCHC: 33.3 g/dL (ref 30.0–36.0)
MCV: 93.8 fl (ref 78.0–100.0)
Monocytes Absolute: 0.3 10*3/uL (ref 0.1–1.0)
Monocytes Relative: 6.1 % (ref 3.0–12.0)
Neutro Abs: 2.7 10*3/uL (ref 1.4–7.7)
Neutrophils Relative %: 49.3 % (ref 43.0–77.0)
Platelets: 268 10*3/uL (ref 150.0–400.0)
RBC: 3.9 Mil/uL (ref 3.87–5.11)
RDW: 13.2 % (ref 11.5–15.5)
WBC: 5.4 10*3/uL (ref 4.0–10.5)

## 2014-03-10 LAB — TSH: TSH: 2.16 u[IU]/mL (ref 0.35–4.50)

## 2014-03-10 LAB — HEPATIC FUNCTION PANEL
ALT: 28 U/L (ref 0–35)
AST: 22 U/L (ref 0–37)
Albumin: 3.5 g/dL (ref 3.5–5.2)
Alkaline Phosphatase: 83 U/L (ref 39–117)
Bilirubin, Direct: 0 mg/dL (ref 0.0–0.3)
Total Bilirubin: 0.5 mg/dL (ref 0.2–1.2)
Total Protein: 7 g/dL (ref 6.0–8.3)

## 2014-03-10 LAB — BASIC METABOLIC PANEL
BUN: 17 mg/dL (ref 6–23)
CO2: 26 mEq/L (ref 19–32)
CREATININE: 0.8 mg/dL (ref 0.4–1.2)
Calcium: 9.3 mg/dL (ref 8.4–10.5)
Chloride: 106 mEq/L (ref 96–112)
GFR: 81.21 mL/min (ref >60.00)
Glucose, Bld: 90 mg/dL (ref 70–99)
Potassium: 3.7 mEq/L (ref 3.5–5.1)
Sodium: 140 mEq/L (ref 135–145)

## 2014-03-10 LAB — POCT URINALYSIS DIPSTICK
Bilirubin, UA: NEGATIVE
Blood, UA: NEGATIVE
Glucose, UA: NEGATIVE
Ketones, UA: NEGATIVE
Leukocytes, UA: NEGATIVE
Nitrite, UA: NEGATIVE
Protein, UA: NEGATIVE
Spec Grav, UA: 1.015
Urobilinogen, UA: 0.2
pH, UA: 7

## 2014-03-10 LAB — LIPID PANEL
Cholesterol: 242 mg/dL — ABNORMAL HIGH (ref 0–200)
HDL: 43.9 mg/dL
NonHDL: 198.1
Total CHOL/HDL Ratio: 6
Triglycerides: 453 mg/dL — ABNORMAL HIGH (ref 0.0–149.0)
VLDL: 90.6 mg/dL — ABNORMAL HIGH (ref 0.0–40.0)

## 2014-03-10 LAB — LDL CHOLESTEROL, DIRECT: LDL DIRECT: 120.9 mg/dL

## 2014-03-10 NOTE — Addendum Note (Signed)
Addended by: Townsend Roger D on: 03/10/2014 11:41 AM   Modules accepted: Orders

## 2014-03-15 ENCOUNTER — Encounter: Payer: Self-pay | Admitting: Obstetrics and Gynecology

## 2014-03-17 ENCOUNTER — Ambulatory Visit (INDEPENDENT_AMBULATORY_CARE_PROVIDER_SITE_OTHER): Payer: 59 | Admitting: Family Medicine

## 2014-03-17 ENCOUNTER — Encounter: Payer: Self-pay | Admitting: Family Medicine

## 2014-03-17 ENCOUNTER — Ambulatory Visit (INDEPENDENT_AMBULATORY_CARE_PROVIDER_SITE_OTHER): Payer: 59

## 2014-03-17 VITALS — BP 130/70 | HR 68 | Temp 97.8°F | Ht 64.0 in | Wt 131.0 lb

## 2014-03-17 DIAGNOSIS — Z0000 Encounter for general adult medical examination without abnormal findings: Secondary | ICD-10-CM

## 2014-03-17 DIAGNOSIS — Z23 Encounter for immunization: Secondary | ICD-10-CM

## 2014-03-17 MED ORDER — ESTRADIOL 0.5 MG PO TABS: 0.5000 mg | ORAL_TABLET | Freq: Every day | ORAL | Status: DC

## 2014-03-17 MED ORDER — PRAVASTATIN SODIUM 40 MG PO TABS: 40.0000 mg | ORAL_TABLET | Freq: Every day | ORAL | Status: DC

## 2014-03-17 MED ORDER — LISINOPRIL-HYDROCHLOROTHIAZIDE 20-12.5 MG PO TABS: 1.0000 | ORAL_TABLET | Freq: Every day | ORAL | Status: DC

## 2014-03-17 NOTE — Progress Notes (Signed)
Pre visit review using our clinic review tool, if applicable. No additional management support is needed unless otherwise documented below in the visit note. 

## 2014-03-17 NOTE — Progress Notes (Signed)
Subjective:    Patient ID: Bridget Strong, female    DOB: [birth year], 63 y.o.   MRN: [id number]  HPI  Patient seen for complete physical. She had fairly complicated right humerus fracture last summer and has had slow recovery. She has right adhesive capsulitis has had fairly extensive physical therapy and is doing home exercises. Patient also has vaginal vault prolapse and has seen gynecologist regarding this. She did not tolerate pessary. She is looking at getting surgery for this probably sometime next summer after her retirement.  Chronic problems include hypertension, hyperlipidemia, postmenopausal. Requesting refills of medications today. She's had shingles vaccine. Needs flu vaccine. Tetanus up-to-date. Colonoscopy up-to-date. She has reduced her exercise because of the cystocele and has had some mild weight gain.  She gets yearly mammograms through gynecologist  Past Medical History  Diagnosis Date  . Chicken pox   . Hyperlipidemia   . Hypertension   . Anemia     with pregnancy  . Broken humerus 10-24-13   Past Surgical History  Procedure Laterality Date  . Cholecystectomy  2004  . Abdominal hysterectomy  1998    TVH--ovaries remain    reports that she has never smoked. She does not have any smokeless tobacco history on file. She reports that she does not drink alcohol or use illicit drugs. family history includes Alcohol abuse in her father and other; Arthritis in her other; Breast cancer (age of onset: 91) in her paternal grandmother; Breast cancer (age of onset: 62) in her maternal grandmother; Hypertension in her other; Stroke in her father and other. Allergies  Allergen Reactions  . Hydrocodone     Nausea, GI upset     Review of Systems  Constitutional: Negative for fever, activity change, appetite change, fatigue and unexpected weight change.  HENT: Negative for ear pain, hearing loss, sore throat and trouble swallowing.   Eyes: Negative for visual disturbance.    Respiratory: Negative for cough and shortness of breath.   Cardiovascular: Negative for chest pain and palpitations.  Gastrointestinal: Negative for abdominal pain, diarrhea, constipation and blood in stool.  Genitourinary: Negative for dysuria and hematuria.  Musculoskeletal: Negative for myalgias and back pain.  Skin: Negative for rash.  Neurological: Negative for dizziness, syncope and headaches.  Hematological: Negative for adenopathy.  Psychiatric/Behavioral: Negative for confusion and dysphoric mood.       Objective:   Physical Exam  Constitutional: She is oriented to person, place, and time. She appears well-developed and well-nourished.  HENT:  Head: Normocephalic and atraumatic.  Eyes: EOM are normal. Pupils are equal, round, and reactive to light.  Neck: Normal range of motion. Neck supple. No thyromegaly present.  Cardiovascular: Normal rate, regular rhythm and normal heart sounds.   No murmur heard. Pulmonary/Chest: Breath sounds normal. No respiratory distress. She has no wheezes. She has no rales.  Abdominal: Soft. Bowel sounds are normal. She exhibits no distension and no mass. There is no tenderness. There is no rebound and no guarding.  Genitourinary:  Per GYN  Musculoskeletal: Normal range of motion. She exhibits no edema.  Lymphadenopathy:    She has no cervical adenopathy.  Neurological: She is alert and oriented to person, place, and time. She displays normal reflexes. No cranial nerve deficit.  Skin: No rash noted.  Psychiatric: She has a normal mood and affect. Her behavior is normal. Judgment and thought content normal.          Assessment & Plan:  Complete physical. Flu vaccine given. Labs reviewed  with patient. She has significant elevation in triglyceride and cholesterol compared to last year which may be related to less exercise and less dietary compliance. Reduce sugars and starches. Increase omega-3 intake. Refilled chronic medications for one  year.  Prevnar 13 by next year

## 2014-03-17 NOTE — Patient Instructions (Signed)

## 2014-05-05 LAB — HM MAMMOGRAPHY: HM Mammogram: NORMAL

## 2014-06-14 ENCOUNTER — Telehealth: Payer: Self-pay | Admitting: Obstetrics and Gynecology

## 2014-06-14 DIAGNOSIS — N993 Prolapse of vaginal vault after hysterectomy: Secondary | ICD-10-CM

## 2014-06-14 NOTE — Telephone Encounter (Signed)
I would try to push to consultation up to April. I would like to be able to accommodate the patient for her desired July time frame for surgery.

## 2014-06-14 NOTE — Telephone Encounter (Signed)
Patient calling to "schedule urodynamics in preparation for surgery."

## 2014-06-14 NOTE — Telephone Encounter (Signed)
Patient returns call.  Last seen by Dr Quincy Simmonds 01-14-14. Was under care of ortho for broken are. Has been released and is ready to plan surgery July 2016. She will be retiring at end of April and wants appointment with Dr Quincy Simmonds in May 2016.  Aware she needs urodynamic testing 2 months prior to surgery so would like to plan this is may after seeing Dr Quincy Simmonds.   Appointment scheduled for 09-15-14 for consult. Has new insurance: [facility] Medicare ID [id number] Group [phone] phone [phone], patient is the insured. She will being card to next appointment. Aware Gabriel Cirri will check insurance benefits and precert.  Agree with this plan?

## 2014-06-14 NOTE — Telephone Encounter (Signed)
PR: $275.66

## 2014-06-14 NOTE — Telephone Encounter (Signed)
Return call to patient. Left message to call back. Last seen by Dr Quincy Simmonds 01-14-14. Was to call office when ready to proceed with surgery and schedule urodynamic testing.  Will send toSabrina in billing dept to update precert info.

## 2014-06-16 NOTE — Telephone Encounter (Signed)
Returning call. She will be at her work number until 4 PM.

## 2014-06-16 NOTE — Telephone Encounter (Signed)
Spoke with patient. Advised that per benefit quote received, she will be responsible for 20% of the allowed amount. Patient will need to pay $275.66 when she comes in for urodynamics testing. Patient agreeable. States that she will discuss scheduling when she comes in for her appt.

## 2014-06-16 NOTE — Telephone Encounter (Signed)
Left message for patient for patient to call back. Need to go over urodynamics benefits.

## 2014-07-01 NOTE — Telephone Encounter (Signed)
Looks great. Thank you for completing this for me and taking care of the patient.  Will close encounter.

## 2014-07-01 NOTE — Telephone Encounter (Signed)
Please contact the patient to facilitate surgical planning and urodynamics.  She has advanced prolapse and will need urodynamics with reduction of the prolapse.  I do not recommend waiting until May to do a consultation if she is still wanting to proceed with surgery in July.  The time line may be too tight.   [place]

## 2014-07-01 NOTE — Telephone Encounter (Signed)
Patient is scheduled for 09/15/14 for a consult to "discuss potential surgery." Okay to close encounter?

## 2014-07-01 NOTE — Telephone Encounter (Signed)
Gay Filler,  I spoke with Ms Ferrucci and scheduled her for 4/6 @330 . She was agreeable to this. Please review to make sure I've completed this correctly.   Thank you,  Jacqlyn Larsen

## 2014-07-01 NOTE — Telephone Encounter (Signed)
Can you see if you can help her move up into April. If you cannot find a place that works for her, let me know.

## 2014-08-18 ENCOUNTER — Encounter: Payer: Self-pay | Admitting: Obstetrics and Gynecology

## 2014-08-18 ENCOUNTER — Ambulatory Visit (INDEPENDENT_AMBULATORY_CARE_PROVIDER_SITE_OTHER): Payer: Medicare Other | Admitting: Obstetrics and Gynecology

## 2014-08-18 VITALS — BP 118/70 | HR 60 | Ht 64.0 in | Wt 131.2 lb

## 2014-08-18 DIAGNOSIS — N993 Prolapse of vaginal vault after hysterectomy: Secondary | ICD-10-CM | POA: Diagnosis not present

## 2014-08-18 NOTE — Progress Notes (Signed)
Patient ID: Bridget Strong, female   DOB: 04/24/1951, 64 y.o.   MRN: [id number] GYNECOLOGY VISIT  PCP:  Carolann Littler, MD  Referring provider:   HPI: 64 y.o.   Married  Caucasian  female   [phone] with Patient's last menstrual period was 05/14/1996.  Status post TVH.  Ovaries remain.  here for evaluation of vaginal prolapse and would like to discuss surgery.  Complains of urinary frequency but states it is difficult to urinate at times.  No dysuria. Can push the bladder back up to void.  Never has had leakage of urine.  Having bowel movements without problem.  Patient developed a vaginal ulceration with short term pessary use.   Using Estrace twice weekly.  Using Vaseline also.  Also taking Estrace tablets orally for hot flashes.    Has recovered from a fall which caused injury to her right arm and shoulder.  Was in a sling for a long time and has done physical therapy.   GYNECOLOGIC HISTORY: Patient's last menstrual period was 05/14/1996. Sexually active:  no Partner preference: female Contraception:  Hysterectomy--ovaries remain  Menopausal hormone therapy: Estrace cream, Estradiol 0.5mg  DES exposure:  no  Blood transfusions: no   Sexually transmitted diseases:  no GYN procedures and prior surgeries:  TVH--ovaries remain Last mammogram:  05-05-14 at [facility]--read at [facility]--normal.               Last pap and high risk HPV testing:   2013 wnl History of abnormal pap smear:  no   OB History    Gravida Para Term Preterm AB TAB SAB Ectopic Multiple Living   4 3 3  1  1   3        Past Medical History  Diagnosis Date  . Chicken pox   . Hyperlipidemia   . Hypertension   . Anemia     with pregnancy  . Broken humerus 10-24-13    Past Surgical History  Procedure Laterality Date  . Cholecystectomy  2004  . Abdominal hysterectomy  1998    TVH--ovaries remain    Current Outpatient Prescriptions  Medication Sig Dispense Refill  . estradiol  (ESTRACE) 0.1 MG/GM vaginal cream Use 1/2 g vaginally two times per week. 42.5 g 2  . estradiol (ESTRACE) 0.5 MG tablet Take 1 tablet (0.5 mg total) by mouth daily. 90 tablet 3  . lisinopril-hydrochlorothiazide (PRINZIDE,ZESTORETIC) 20-12.5 MG per tablet Take 1 tablet by mouth daily. 90 tablet 3  . pravastatin (PRAVACHOL) 40 MG tablet Take 1 tablet (40 mg total) by mouth daily. 90 tablet 3  . vitamin C (ASCORBIC ACID) 500 MG tablet Take 500 mg by mouth daily.      . Calcium Carbonate-Vitamin D (CALCIUM-VITAMIN D) 600-200 MG-UNIT CAPS Take by mouth daily.       No current facility-administered medications for this visit.     ALLERGIES: Hydrocodone  Family History  Problem Relation Age of Onset  . Alcohol abuse Other   . Arthritis Other   . Hypertension Other   . Stroke Other   . Stroke Father   . Alcohol abuse Father   . Breast cancer Maternal Grandmother 85  . Breast cancer Paternal Grandmother 66    History   Social History  . Marital Status: Married    Spouse Name: N/A  . Number of Children: N/A  . Years of Education: N/A   Occupational History  . Not on file.   Social History Main Topics  . Smoking  status: Never Smoker   . Smokeless tobacco: Not on file  . Alcohol Use: No  . Drug Use: No  . Sexual Activity:    Partners: Male    Birth Control/ Protection: Surgical     Comment: TVH-ovaries remain   Other Topics Concern  . Not on file   Social History Narrative    ROS:  Pertinent items are noted in HPI.  PHYSICAL EXAMINATION:    BP 118/70 mmHg  Pulse 60  Ht 5\' 4"  (1.626 m)  Wt 131 lb 3.2 oz (59.512 kg)  BMI 22.51 kg/m2  LMP 05/14/1996   Wt Readings from Last 3 Encounters:  08/18/14 131 lb 3.2 oz (59.512 kg)  03/17/14 131 lb (59.421 kg)  01/14/14 129 lb (58.514 kg)     Ht Readings from Last 3 Encounters:  08/18/14 5\' 4"  (1.626 m)  03/17/14 5\' 4"  (1.626 m)  01/14/14 5\' 4"  (1.626 m)    General appearance: alert, cooperative and appears stated  age Abdomen: soft, non-tender; no masses,  no organomegaly  Pelvic: External genitalia:  no lesions              Urethra:  normal appearing urethra with no masses, tenderness or lesions              Bartholins and Skenes: normal                 Vagina: normal appearing vagina with normal color and discharge, no lesions.  Complete vaginal vault prolapse.               Cervix: absent                 Bimanual Exam:  Uterus:   absent                                      Adnexa: normal adnexa in size, nontender and no masses                                      Rectovaginal: Confirms                                      Anus:  normal sphincter tone, no lesions  ASSESSMENT  Post hysterectomy vaginal vault prolapse.  Prior vaginal ulceration with pessary use.   PLAN  Will proceed with urodynamic testing with reduction of prolapse using a pessary. Procedure explained.   I discussed routes of surgery including vaginal, abdominal, or laparoscopic vault prolapse repair combined with anterior and posterior colporrhaphy and likely a midurethral sling and cystoscopy.  Patient understands that I do not do laparoscopic prolapse repairs and that she would need to be referred out for this.  She prefers to remain under my care and with the assistance of Dr. Harrington Challenger if available for surgical assisting.  We have again today and previously discussed abdomino-sacrocolpopexy as a preferred route of surgery due to her busy lifestyle, young age, and good health.    Her ovaries may be removed if we are proceeding definitely with an abdominal procedure.  An After Visit Summary was printed and given to the patient.  ___15____ minutes face to face time of which over 50% was spent in counseling.

## 2014-08-18 NOTE — Patient Instructions (Signed)
We will call to schedule urodynamic testing.

## 2014-08-20 ENCOUNTER — Telehealth: Payer: Self-pay | Admitting: Obstetrics and Gynecology

## 2014-08-20 NOTE — Telephone Encounter (Signed)
Call to patient. Advised of benefit quote received for urodynamics. Patient agreeable. Scheduled urinalysis and urodynamics procedure. Advised patient of 72 hour cancellation policy and $585 cancellation fee. Patient agreeable.

## 2014-09-03 ENCOUNTER — Ambulatory Visit (INDEPENDENT_AMBULATORY_CARE_PROVIDER_SITE_OTHER): Payer: Medicare Other | Admitting: *Deleted

## 2014-09-03 VITALS — BP 114/74 | HR 80 | Ht 64.0 in | Wt 130.6 lb

## 2014-09-03 DIAGNOSIS — N993 Prolapse of vaginal vault after hysterectomy: Secondary | ICD-10-CM | POA: Diagnosis not present

## 2014-09-03 LAB — POCT URINALYSIS DIPSTICK
LEUKOCYTES UA: NEGATIVE
PH UA: 5
Urobilinogen, UA: NEGATIVE

## 2014-09-03 NOTE — Progress Notes (Signed)
Patient is here for Urinalysis for Urodynamics Study Urinalysis: Normal Urodynamics is scheduled for 09/08/14, patient is aware. Routed to provider for review, encounter closed.

## 2014-09-05 NOTE — Progress Notes (Signed)
Encounter reviewed by Dr. Brook Silva.  

## 2014-09-08 ENCOUNTER — Ambulatory Visit (INDEPENDENT_AMBULATORY_CARE_PROVIDER_SITE_OTHER): Payer: Medicare Other | Admitting: Obstetrics and Gynecology

## 2014-09-08 VITALS — BP 124/70 | HR 65 | Resp 16 | Wt 130.0 lb

## 2014-09-08 DIAGNOSIS — N393 Stress incontinence (female) (male): Secondary | ICD-10-CM | POA: Diagnosis not present

## 2014-09-08 DIAGNOSIS — N993 Prolapse of vaginal vault after hysterectomy: Secondary | ICD-10-CM

## 2014-09-08 NOTE — Patient Instructions (Signed)
.   You may have a mild bladder and rectal discomfort for a few hours after the test. . You may experience some frequent urination and slight burning the first few times you urinate after the test. Rarely, the urine may be blood tinged. These are both due to catheter placements and resolve quickly.  . You should call our office immediately if you have signs of infection, which may include bladder pain, urinary urgency, fever, or burning during urination. .  We do encourage you to drink plenty of water after completion of the test today.   

## 2014-09-08 NOTE — Progress Notes (Signed)
Bridget Strong is a 64 y.o. female Who presents today for urodynamics testing, ordered by Dr. Quincy Simmonds.   Allergies and medications reviewed.  Denies complaints today. No urinary complaints.   Urine Micro exam: negative for WBC's or RBC's, okay to proceed per Dr. Quincy Simmonds.  Patient reports difficulty with voiding due to vaginal vault prolapse. Prolapse was reduced with #6 ring pessary prior to insertion of catheters.  Urodynamics testing initiated. Lumax Bladder Catheter #10 [place] and lumax Abdominal Catheter #10 [place].   Post void residual 0 ml.   Urethral catheter placed without issue. Rectal catheter placed without issue.   Urodynamics testing completed. Please see scanned Patient summary report in Epic. Procedure completed and patient tolerated well without complaints. Patient scheduled for follow up office visit with Dr. Quincy Simmonds to discuss results. Patient agreeable.   Patient given post procedure instructions and verbalized understanding:  You may have a mild bladder and rectal discomfort for a few hours after the test. You may experience some frequent urination and slight burning the first few times you urinate after the test. Rarely, the urine may be blood tinged. These are both due to catheter placements and resolve quickly. You should call our office immediately if you have signs of infection, which may include bladder pain, urinary urgency, fever, or burning during urination. We do encourage you to drink plenty of water after the test.

## 2014-09-11 ENCOUNTER — Encounter: Payer: Self-pay | Admitting: Obstetrics and Gynecology

## 2014-09-11 NOTE — Progress Notes (Signed)
Multichannel urodynamic testing with reduction of prolapse using pessary.   Uroflow - Void 18 cc, PRV 0 cc. Intermittent flow CMG - S1 320 cc, S2 372 cc, S3 500 cc. Max capacity 503.4 cc.  VLPP 47 cm H20 at 504 cc.  UPP - 12 cm H20.   Pressure flow - Pdet max 61 cm H20.  Evidence os genuine stress incontinence with reduction of prolapse.  Low LPP and UPP.  Candidate for midurethral sling.

## 2014-09-15 ENCOUNTER — Institutional Professional Consult (permissible substitution): Payer: Self-pay | Admitting: Obstetrics and Gynecology

## 2014-09-24 ENCOUNTER — Telehealth: Payer: Self-pay

## 2014-09-24 ENCOUNTER — Ambulatory Visit: Payer: Self-pay | Admitting: Obstetrics and Gynecology

## 2014-09-24 NOTE — Telephone Encounter (Signed)
Spoke with patient-aware Dr Quincy Simmonds is out sick today and that we will call her back to reschedule her urodynamics follow up.//kn

## 2014-09-24 NOTE — Telephone Encounter (Signed)
Appointment rescheduled to 09-30-14 at 330pm.  Routing to provider for final review. Patient agreeable to disposition. Will close encounter.

## 2014-09-30 ENCOUNTER — Ambulatory Visit (INDEPENDENT_AMBULATORY_CARE_PROVIDER_SITE_OTHER): Payer: Medicare Other | Admitting: Obstetrics and Gynecology

## 2014-09-30 ENCOUNTER — Encounter: Payer: Self-pay | Admitting: Obstetrics and Gynecology

## 2014-09-30 VITALS — BP 110/70 | HR 76 | Ht 64.0 in | Wt 131.0 lb

## 2014-09-30 DIAGNOSIS — N393 Stress incontinence (female) (male): Secondary | ICD-10-CM

## 2014-09-30 DIAGNOSIS — N993 Prolapse of vaginal vault after hysterectomy: Secondary | ICD-10-CM | POA: Diagnosis not present

## 2014-09-30 NOTE — Progress Notes (Signed)
GYNECOLOGY  VISIT   HPI: 64 y.o.   Married  Caucasian  female   [phone] with Patient's last menstrual period was 05/14/1996.   here to discuss results of urodynamic testing and do surgical planning . Does not usually leak urine with cough, laugh.   Multichannel urodynamic testing with reduction of prolapse using pessary performed on 09/08/14.  Uroflow - Void 18 cc, PRV 0 cc. Intermittent flow CMG - S1 320 cc, S2 372 cc, S3 500 cc. Max capacity 503.4 cc. VLPP 47 cm H20 at 504 cc.  UPP - 12 cm H20.  Pressure flow - Pdet max 61 cm H20.  Evidence of genuine stress incontinence with reduction of prolapse.  Low LPP and UPP.  Candidate for midurethral sling.   Wants tubes and ovaries.  Went to ICU post op following hysterectomy for Bigeminy.   Planning on surgery November 23, 2014.  Using vaginal estrogen cream twice a week.  GYNECOLOGIC HISTORY: Patient's last menstrual period was 05/14/1996. Contraception: Hysterectomy  Menopausal hormone therapy: Estradiol 0.5 mg  Last mammogram:  12/ 23/15 - told normal Last pap smear: 04/23/12 wnl        OB History    Gravida Para Term Preterm AB TAB SAB Ectopic Multiple Living   4 3 3  1  1   3          Patient Active Problem List   Diagnosis Date Noted  . Vaginal vault prolapse after hysterectomy 11/09/2013  . BACTERIAL PNEUMONIA 06/13/2010  . FEVER UNSPECIFIED 06/09/2010  . HYPERLIPIDEMIA 02/09/2009  . HYPERTENSION 02/09/2009    Past Medical History  Diagnosis Date  . Chicken pox   . Hyperlipidemia   . Hypertension   . Anemia     with pregnancy  . Broken humerus 10-24-13    Past Surgical History  Procedure Laterality Date  . Cholecystectomy  2004  . Abdominal hysterectomy  1998    TVH--ovaries remain    Current Outpatient Prescriptions  Medication Sig Dispense Refill  . Calcium Carbonate-Vitamin D (CALCIUM-VITAMIN D) 600-200 MG-UNIT CAPS Take by mouth daily.      Marland Kitchen estradiol (ESTRACE) 0.1 MG/GM vaginal cream Use  1/2 g vaginally two times per week. 42.5 g 2  . estradiol (ESTRACE) 0.5 MG tablet Take 1 tablet (0.5 mg total) by mouth daily. 90 tablet 3  . lisinopril-hydrochlorothiazide (PRINZIDE,ZESTORETIC) 20-12.5 MG per tablet Take 1 tablet by mouth daily. 90 tablet 3  . pravastatin (PRAVACHOL) 40 MG tablet Take 1 tablet (40 mg total) by mouth daily. 90 tablet 3  . vitamin C (ASCORBIC ACID) 500 MG tablet Take 500 mg by mouth daily.       No current facility-administered medications for this visit.     ALLERGIES: Hydrocodone  Family History  Problem Relation Age of Onset  . Alcohol abuse Other   . Arthritis Other   . Hypertension Other   . Stroke Other   . Stroke Father   . Alcohol abuse Father   . Breast cancer Maternal Grandmother 73  . Breast cancer Paternal Grandmother 46    History   Social History  . Marital Status: Married    Spouse Name: N/A  . Number of Children: N/A  . Years of Education: N/A   Occupational History  . Not on file.   Social History Main Topics  . Smoking status: Never Smoker   . Smokeless tobacco: Not on file  . Alcohol Use: No  . Drug Use: No  . Sexual Activity:  Partners: Male    Patent examiner Protection: Surgical     Comment: TVH-ovaries remain   Other Topics Concern  . Not on file   Social History Narrative    ROS:  Pertinent items are noted in HPI.  PHYSICAL EXAMINATION:    BP 110/70 mmHg  Pulse 76  Ht 5\' 4"  (1.626 m)  Wt 131 lb (59.421 kg)  BMI 22.47 kg/m2  LMP 05/14/1996    General:  Pleasant affect and engaging.   ASSESSMENT  Status post TVH. Post hysterectomy vaginal vault prolapse.  Occult stress incontinence.  History of bigeminy following hysterectomy.   PLAN  Counseled on occult stress incontinence. Counseled regarding surgical procedure of abdominal sacrocopopexy with permanent mesh, bilateral salpingo-oophorectomy, midurethral sling, cystoscopy and anterior and posterior colporrhapahy.  We discussed  alternatives, benefits and risks of surgery which include but are not limited to bleeding, infection, damage to surrounding organs, ureteral damage, vaginal pain with intercourse, permanent mesh use which may cause erosion and exposure in vagina, urethra, bladder or ureters, dyspareunia, slower voiding and urinary retention, possible need for prolonged catheterization and/or self catheterization, de novo overactive bladder symptoms, reoperation, recurrence of prolapse and incontinence,  DVT, PE, death, and reaction to anesthesia.   Will need anesthesia consultation.  Will get mammogram report from most recent mammogram.  An After Visit Summary was printed and given to the patient.  ___15___ minutes face to face time of which over 50% was spent in counseling.

## 2014-10-05 ENCOUNTER — Encounter: Payer: Self-pay | Admitting: *Deleted

## 2014-10-07 ENCOUNTER — Telehealth: Payer: Self-pay | Admitting: *Deleted

## 2014-10-07 NOTE — Telephone Encounter (Signed)
Call to patient. Available surgery dates and scheduling policies discussed. Desires to proceed with November 23, 2014 as discussed during urodynamics appointment. Advised will proceed with scheduling and call her back.

## 2014-10-25 NOTE — Telephone Encounter (Signed)
Call to patient. Confirmed  Surgery is scheduled for November 23, 2014 at 0730 at [place] [address], hospital to confirm arrival time. Surgery instruction sheet reviewed and printed copy mailed, see scanned copy.  Routing to provider for final review. Patient agreeable to disposition. Will close encounter.  Routed to Dr Sabra Heck while Dr Quincy Simmonds out of office.

## 2014-10-25 NOTE — Telephone Encounter (Signed)
FYI

## 2014-10-25 NOTE — Telephone Encounter (Signed)
Patient is asking to talk with Gay Filler. No further details given.

## 2014-11-08 ENCOUNTER — Other Ambulatory Visit: Payer: Self-pay

## 2014-11-10 ENCOUNTER — Encounter: Payer: Self-pay | Admitting: Obstetrics and Gynecology

## 2014-11-10 ENCOUNTER — Ambulatory Visit (INDEPENDENT_AMBULATORY_CARE_PROVIDER_SITE_OTHER): Payer: Medicare Other | Admitting: Obstetrics and Gynecology

## 2014-11-10 VITALS — BP 102/62 | HR 68 | Resp 18 | Ht 64.0 in | Wt 130.0 lb

## 2014-11-10 DIAGNOSIS — N993 Prolapse of vaginal vault after hysterectomy: Secondary | ICD-10-CM | POA: Diagnosis not present

## 2014-11-10 DIAGNOSIS — N393 Stress incontinence (female) (male): Secondary | ICD-10-CM

## 2014-11-10 NOTE — Progress Notes (Signed)
GYNECOLOGY  VISIT   HPI: 64 y.o.   Married  Caucasian  female   [phone] with Patient's last menstrual period was 05/14/1996.   here for discussion of surgery for treatment of vaginal vault prolapse.   Status post total vaginal hysterectomy with cystocele and rectocele repair in 1998 - Dr. Harrington Challenger.  States that prolapse was fairly advanced at that time. New onset of recurrent prolapse occurred in August 2014.  Pessary resulted in vaginal mucosal erosion. Is using oral and vaginal estrogen.   Has normal bowel movements. Has some urinary frequency and incomplete voiding.  Does not usually leak with cough, laugh, or sneeze.  Multichannel urodynamic testing with reduction of prolapse using pessary performed on 09/08/14 documented occult genuine stress incontinence with reduction of the prolapse. LPP and UPP were low.  Uroflow - Void 18 cc, PRV 0 cc. Intermittent flow CMG - S1 320 cc, S2 372 cc, S3 500 cc. Max capacity 503.4 cc. VLPP 47 cm H20 at 504 cc.  UPP - 12 cm H20.  Pressure flow - Pdet max 61 cm H20.    GYNECOLOGIC HISTORY: Patient's last menstrual period was 05/14/1996. Contraception: Hysterectomy  Menopausal hormone therapy: Estrace vaginal cream and tablet  Last mammogram: 05/11/14 BIRADS1:Neg Last pap smear: 04/23/12 Neg        OB History    Gravida Para Term Preterm AB TAB SAB Ectopic Multiple Living   4 3 3  1  1   3          Patient Active Problem List   Diagnosis Date Noted  . Vaginal vault prolapse after hysterectomy 11/09/2013  . BACTERIAL PNEUMONIA 06/13/2010  . FEVER UNSPECIFIED 06/09/2010  . HYPERLIPIDEMIA 02/09/2009  . HYPERTENSION 02/09/2009    Past Medical History  Diagnosis Date  . Chicken pox   . Hyperlipidemia   . Hypertension   . Anemia     with pregnancy  . Broken humerus 10-24-13    Past Surgical History  Procedure Laterality Date  . Cholecystectomy  2004  . Abdominal hysterectomy  1998    TVH--ovaries remain    Current  Outpatient Prescriptions  Medication Sig Dispense Refill  . Calcium Carbonate-Vitamin D (CALCIUM-VITAMIN D) 600-200 MG-UNIT CAPS Take by mouth daily.      Marland Kitchen estradiol (ESTRACE) 0.1 MG/GM vaginal cream Use 1/2 g vaginally two times per week. 42.5 g 2  . estradiol (ESTRACE) 0.5 MG tablet Take 1 tablet (0.5 mg total) by mouth daily. 90 tablet 3  . lisinopril-hydrochlorothiazide (PRINZIDE,ZESTORETIC) 20-12.5 MG per tablet Take 1 tablet by mouth daily. 90 tablet 3  . pravastatin (PRAVACHOL) 40 MG tablet Take 1 tablet (40 mg total) by mouth daily. 90 tablet 3  . vitamin C (ASCORBIC ACID) 500 MG tablet Take 500 mg by mouth daily.       No current facility-administered medications for this visit.     ALLERGIES: Hydrocodone and Tramadol  Family History  Problem Relation Age of Onset  . Alcohol abuse Other   . Arthritis Other   . Hypertension Other   . Stroke Other   . Stroke Father   . Alcohol abuse Father   . Breast cancer Maternal Grandmother 73  . Breast cancer Paternal Grandmother 55    History   Social History  . Marital Status: Married    Spouse Name: N/A  . Number of Children: N/A  . Years of Education: N/A   Occupational History  . Not on file.   Social History Main Topics  .  Smoking status: Never Smoker   . Smokeless tobacco: Not on file  . Alcohol Use: No  . Drug Use: No  . Sexual Activity:    Partners: Male    Birth Control/ Protection: Surgical     Comment: TVH-ovaries remain   Other Topics Concern  . Not on file   Social History Narrative    ROS:  Pertinent items are noted in HPI.  PHYSICAL EXAMINATION:    BP 102/62 mmHg  Pulse 68  Resp 18  Ht 5\' 4"  (1.626 m)  Wt 130 lb (58.968 kg)  BMI 22.30 kg/m2  LMP 05/14/1996    General appearance: alert, cooperative and appears stated age Head: Normocephalic, without obvious abnormality, atraumatic Neck: no adenopathy, supple, symmetrical, trachea midline and thyroid normal to inspection and  palpation Lungs: clear to auscultation bilaterally   Heart: regular rate and rhythm Abdomen: soft, non-tender; bowel sounds normal; no masses,  no organomegaly Extremities: extremities normal, atraumatic, no cyanosis or edema Skin: Skin color, texture, turgor normal. No rashes or lesions Lymph nodes: Cervical, supraclavicular, and axillary nodes normal. No abnormal inguinal nodes palpated Neurologic: Grossly normal  Pelvic: External genitalia:  no lesions              Urethra:  normal appearing urethra with no masses, tenderness or lesions              Bartholins and Skenes: normal                 Vagina: normal appearing vagina with normal color and discharge, no lesions.  Complete vaginal vault eversion.              Cervix: absent             Bimanual Exam:  Uterus:  uterus absent              Adnexa: normal adnexa and no mass, fullness, tenderness              Rectovaginal: Yes.  .  Confirms.              Anus:  normal sphincter tone, no lesions  Chaperone was present for exam.  ASSESSMENT  Status post TVH and anterior and posterior colporrhaphy. Complete vaginal vault prolapse. Occult stress incontinence. Estrogen therapy patient.   PLAN  Surgery scheduled for 11/23/14 - Abdominal sacrocolpopexy, bilateral salpingo-oophorectomy, Halban's culdoplasty, anterior and posterior colporrhaphy, TVT Exact midurethral sling, cystoscopy.  I have discussed risks, benefits, and alternatives with the patient who wishes to proceed.  We have discussed at length the use of permanent mesh material for the sacrocolpopexy and midurethral sling and the use of permanent suture for the Halban's culdoplasty.   We have discussed the potential complications of of erosion, exposure and pain related to use of these materials, and she accepts this. We have discussed possible life threatening bleeding from the procedure as well. Patient indicates understanding of the procedure and recovery  expectations. Stop oral Estrace. Do magnesium citrate bowel preparation.    An After Visit Summary was printed and given to the patient.  __15____ minutes face to face time of which over 50% was spent in counseling.

## 2014-11-11 ENCOUNTER — Encounter: Payer: Self-pay | Admitting: Obstetrics and Gynecology

## 2014-11-17 ENCOUNTER — Other Ambulatory Visit: Payer: Self-pay

## 2014-11-17 ENCOUNTER — Encounter (HOSPITAL_COMMUNITY): Payer: Self-pay

## 2014-11-17 ENCOUNTER — Encounter (HOSPITAL_COMMUNITY)
Admission: RE | Admit: 2014-11-17 | Discharge: 2014-11-17 | Disposition: A | Discharge status: Home / Self Care | Payer: Medicare Other | Source: Ambulatory Visit | Attending: Obstetrics and Gynecology | Admitting: Obstetrics and Gynecology

## 2014-11-17 DIAGNOSIS — Z79890 Hormone replacement therapy (postmenopausal): Secondary | ICD-10-CM | POA: Diagnosis not present

## 2014-11-17 DIAGNOSIS — Z01818 Encounter for other preprocedural examination: Secondary | ICD-10-CM | POA: Diagnosis not present

## 2014-11-17 DIAGNOSIS — N993 Prolapse of vaginal vault after hysterectomy: Secondary | ICD-10-CM | POA: Diagnosis not present

## 2014-11-17 DIAGNOSIS — T402X5A Adverse effect of other opioids, initial encounter: Secondary | ICD-10-CM | POA: Diagnosis not present

## 2014-11-17 DIAGNOSIS — Z79899 Other long term (current) drug therapy: Secondary | ICD-10-CM | POA: Diagnosis not present

## 2014-11-17 DIAGNOSIS — N736 Female pelvic peritoneal adhesions (postinfective): Secondary | ICD-10-CM | POA: Diagnosis not present

## 2014-11-17 DIAGNOSIS — I10 Essential (primary) hypertension: Secondary | ICD-10-CM | POA: Diagnosis not present

## 2014-11-17 DIAGNOSIS — N393 Stress incontinence (female) (male): Secondary | ICD-10-CM | POA: Insufficient documentation

## 2014-11-17 DIAGNOSIS — R441 Visual hallucinations: Secondary | ICD-10-CM | POA: Diagnosis not present

## 2014-11-17 DIAGNOSIS — Z90710 Acquired absence of both cervix and uterus: Secondary | ICD-10-CM | POA: Diagnosis not present

## 2014-11-17 DIAGNOSIS — R350 Frequency of micturition: Secondary | ICD-10-CM | POA: Diagnosis not present

## 2014-11-17 DIAGNOSIS — Z23 Encounter for immunization: Secondary | ICD-10-CM | POA: Diagnosis not present

## 2014-11-17 DIAGNOSIS — N8110 Cystocele, unspecified: Secondary | ICD-10-CM | POA: Diagnosis not present

## 2014-11-17 DIAGNOSIS — E785 Hyperlipidemia, unspecified: Secondary | ICD-10-CM | POA: Diagnosis not present

## 2014-11-17 DIAGNOSIS — Z9049 Acquired absence of other specified parts of digestive tract: Secondary | ICD-10-CM | POA: Diagnosis not present

## 2014-11-17 HISTORY — DX: Adverse effect of unspecified anesthetic, initial encounter: T41.45XA

## 2014-11-17 LAB — BASIC METABOLIC PANEL
Anion gap: 8 (ref 5–15)
BUN: 29 mg/dL — ABNORMAL HIGH (ref 6–20)
CALCIUM: 8.9 mg/dL (ref 8.9–10.3)
CO2: 26 mmol/L (ref 22–32)
Chloride: 102 mmol/L (ref 101–111)
Creatinine, Ser: 0.84 mg/dL (ref 0.44–1.00)
GFR calc Af Amer: >60 mL/min (ref >60)
Glucose, Bld: 103 mg/dL — ABNORMAL HIGH (ref 65–99)
Potassium: 3.6 mmol/L (ref 3.5–5.1)
Sodium: 136 mmol/L (ref 135–145)

## 2014-11-17 LAB — CBC
HCT: 40 % (ref 36.0–46.0)
Hemoglobin: 13.5 g/dL (ref 12.0–15.0)
MCH: 31.5 pg (ref 26.0–34.0)
MCHC: 33.8 g/dL (ref 30.0–36.0)
MCV: 93.2 fL (ref 78.0–100.0)
PLATELETS: 253 10*3/uL (ref 150–400)
RBC: 4.29 MIL/uL (ref 3.87–5.11)
RDW: 13.3 % (ref 11.5–15.5)
WBC: 4.6 10*3/uL (ref 4.0–10.5)

## 2014-11-17 LAB — TYPE AND SCREEN
ABO/RH(D): B POS
Antibody Screen: NEGATIVE

## 2014-11-17 LAB — ABO/RH: ABO/RH(D): B POS

## 2014-11-17 NOTE — Patient Instructions (Signed)
Your procedure is scheduled on:11/23/14  Enter through the Main Entrance at :[place] up desk phone and dial [phone] and inform us of your arrival.  Please call [phone] if you have any problems the morning of surgery.  Remember: Do not eat food or drink liquids, including water, after midnight:Monday   You may brush your teeth the morning of surgery.   DO NOT wear jewelry, eye make-up, lipstick,body lotion, or dark fingernail polish.  (Polished toes are ok) You may wear deodorant.  If you are to be admitted after surgery, leave suitcase in car until your room has been assigned. Patients discharged on the day of surgery will not be allowed to drive home. Wear loose fitting, comfortable clothes for your ride home.

## 2014-11-22 MED ORDER — DEXTROSE 5 % IV SOLN
2.0000 g | INTRAVENOUS | Status: AC
  Administered 2014-11-23: 2 g via INTRAVENOUS
  Filled 2014-11-22: qty 2

## 2014-11-22 NOTE — Anesthesia Preprocedure Evaluation (Addendum)
Anesthesia Evaluation  Patient identified by MRN, date of birth, ID band Patient awake    Reviewed: Allergy & Precautions, NPO status , Patient's Chart, lab work & pertinent test results  History of Anesthesia Complications (+) PONV and history of anesthetic complications (reports hx of bigeminy post op)  Airway Mallampati: II  TM Distance: >3 FB Neck ROM: Full    Dental no notable dental hx. (+) Dental Advisory Given   Pulmonary neg pulmonary ROS,  breath sounds clear to auscultation  Pulmonary exam normal       Cardiovascular hypertension, Pt. on medications Normal cardiovascular examRhythm:Regular Rate:Normal     Neuro/Psych negative neurological ROS  negative psych ROS   GI/Hepatic negative GI ROS, Neg liver ROS,   Endo/Other  negative endocrine ROS  Renal/GU negative Renal ROS  negative genitourinary   Musculoskeletal negative musculoskeletal ROS (+)   Abdominal   Peds negative pediatric ROS (+)  Hematology negative hematology ROS (+)   Anesthesia Other Findings   Reproductive/Obstetrics negative OB ROS                            Anesthesia Physical Anesthesia Plan  ASA: II  Anesthesia Plan: General   Post-op Pain Management:    Induction: Intravenous  Airway Management Planned: Oral ETT  Additional Equipment:   Intra-op Plan:   Post-operative Plan: Extubation in OR  Informed Consent: I have reviewed the patients History and Physical, chart, labs and discussed the procedure including the risks, benefits and alternatives for the proposed anesthesia with the patient or authorized representative who has indicated his/her understanding and acceptance.   Dental advisory given  Plan Discussed with: CRNA  Anesthesia Plan Comments:         Anesthesia Quick Evaluation

## 2014-11-22 NOTE — H&P (Signed)
Nunzio Cobbs, MD at 11/10/2014 3:03 PM     Status: Signed       Expand All Collapse All   GYNECOLOGY VISIT  HPI: 64 y.o. Married Caucasian female  [phone] with Patient's last menstrual period was 05/14/1996.  here for discussion of surgery for treatment of vaginal vault prolapse.   Status post total vaginal hysterectomy with cystocele and rectocele repair in 1998 - Dr. Harrington Challenger.  States that prolapse was fairly advanced at that time. New onset of recurrent prolapse occurred in August 2014.  Pessary resulted in vaginal mucosal erosion. Is using oral and vaginal estrogen.   Has normal bowel movements. Has some urinary frequency and incomplete voiding.  Does not usually leak with cough, laugh, or sneeze.  Multichannel urodynamic testing with reduction of prolapse using pessary performed on 09/08/14 documented occult genuine stress incontinence with reduction of the prolapse. LPP and UPP were low.  Uroflow - Void 18 cc, PRV 0 cc. Intermittent flow CMG - S1 320 cc, S2 372 cc, S3 500 cc. Max capacity 503.4 cc. VLPP 47 cm H20 at 504 cc.  UPP - 12 cm H20.  Pressure flow - Pdet max 61 cm H20.    GYNECOLOGIC HISTORY: Patient's last menstrual period was 05/14/1996. Contraception: Hysterectomy  Menopausal hormone therapy: Estrace vaginal cream and tablet  Last mammogram: 05/11/14 BIRADS1:Neg Last pap smear: 04/23/12 Neg   OB History    Gravida Para Term Preterm AB TAB SAB Ectopic Multiple Living   4 3 3  1  1   3        Patient Active Problem List   Diagnosis Date Noted  . Vaginal vault prolapse after hysterectomy 11/09/2013  . BACTERIAL PNEUMONIA 06/13/2010  . FEVER UNSPECIFIED 06/09/2010  . HYPERLIPIDEMIA 02/09/2009  . HYPERTENSION 02/09/2009    Past Medical History  Diagnosis Date  . Chicken pox   . Hyperlipidemia   . Hypertension   . Anemia     with pregnancy  .  Broken humerus 10-24-13    Past Surgical History  Procedure Laterality Date  . Cholecystectomy  2004  . Abdominal hysterectomy  1998    TVH--ovaries remain    Current Outpatient Prescriptions  Medication Sig Dispense Refill  . Calcium Carbonate-Vitamin D (CALCIUM-VITAMIN D) 600-200 MG-UNIT CAPS Take by mouth daily.     Marland Kitchen estradiol (ESTRACE) 0.1 MG/GM vaginal cream Use 1/2 g vaginally two times per week. 42.5 g 2  . estradiol (ESTRACE) 0.5 MG tablet Take 1 tablet (0.5 mg total) by mouth daily. 90 tablet 3  . lisinopril-hydrochlorothiazide (PRINZIDE,ZESTORETIC) 20-12.5 MG per tablet Take 1 tablet by mouth daily. 90 tablet 3  . pravastatin (PRAVACHOL) 40 MG tablet Take 1 tablet (40 mg total) by mouth daily. 90 tablet 3  . vitamin C (ASCORBIC ACID) 500 MG tablet Take 500 mg by mouth daily.      No current facility-administered medications for this visit.     ALLERGIES: Hydrocodone and Tramadol  Family History  Problem Relation Age of Onset  . Alcohol abuse Other   . Arthritis Other   . Hypertension Other   . Stroke Other   . Stroke Father   . Alcohol abuse Father   . Breast cancer Maternal Grandmother 82  . Breast cancer Paternal Grandmother 36    History   Social History  . Marital Status: Married    Spouse Name: N/A  . Number of Children: N/A  . Years of Education: N/A   Occupational History  .  Not on file.   Social History Main Topics  . Smoking status: Never Smoker   . Smokeless tobacco: Not on file  . Alcohol Use: No  . Drug Use: No  . Sexual Activity:    Partners: Male    Birth Control/ Protection: Surgical     Comment: TVH-ovaries remain   Other Topics Concern  . Not on file   Social History Narrative    ROS: Pertinent items are noted in HPI.  PHYSICAL EXAMINATION:   BP 102/62 mmHg   Pulse 68  Resp 18  Ht 5\' 4"  (1.626 m)  Wt 130 lb (58.968 kg)  BMI 22.30 kg/m2  LMP 05/14/1996  General appearance: alert, cooperative and appears stated age Head: Normocephalic, without obvious abnormality, atraumatic Neck: no adenopathy, supple, symmetrical, trachea midline and thyroid normal to inspection and palpation Lungs: clear to auscultation bilaterally  Heart: regular rate and rhythm Abdomen: soft, non-tender; bowel sounds normal; no masses, no organomegaly Extremities: extremities normal, atraumatic, no cyanosis or edema Skin: Skin color, texture, turgor normal. No rashes or lesions Lymph nodes: Cervical, supraclavicular, and axillary nodes normal. No abnormal inguinal nodes palpated Neurologic: Grossly normal  Pelvic: External genitalia: no lesions  Urethra: normal appearing urethra with no masses, tenderness or lesions  Bartholins and Skenes: normal   Vagina: normal appearing vagina with normal color and discharge, no lesions. Complete vaginal vault eversion.  Cervix: absent   Bimanual Exam: Uterus: uterus absent  Adnexa: normal adnexa and no mass, fullness, tenderness  Rectovaginal: Yes. . Confirms.  Anus: normal sphincter tone, no lesions  Chaperone was present for exam.  ASSESSMENT  Status post TVH and anterior and posterior colporrhaphy. Complete vaginal vault prolapse. Occult stress incontinence. Estrogen therapy patient.   PLAN  Surgery scheduled for 11/23/14 - Abdominal sacrocolpopexy, bilateral salpingo-oophorectomy, Halban's culdoplasty, anterior and posterior colporrhaphy, TVT Exact midurethral sling, cystoscopy.  I have discussed risks, benefits, and alternatives with the patient who wishes to proceed.  We have discussed at length the use of permanent mesh material for the sacrocolpopexy and midurethral sling and the use of permanent suture  for the Halban's culdoplasty. We have discussed the potential complications of of erosion, exposure and pain related to use of these materials, and she accepts this. We have discussed possible life threatening bleeding from the procedure as well. Patient indicates understanding of the procedure and recovery expectations. Stop oral Estrace. Do magnesium citrate bowel preparation.   An After Visit Summary was printed and given to the patient.  __15____ minutes face to face time of which over 50% was spent in counseling.

## 2014-11-23 ENCOUNTER — Inpatient Hospital Stay (HOSPITAL_COMMUNITY)
Admission: RE | Admit: 2014-11-23 | Discharge: 2014-11-25 | DRG: 743 | Disposition: A | Discharge status: Home / Self Care | Payer: Medicare Other | Source: Ambulatory Visit | Attending: Obstetrics and Gynecology | Admitting: Obstetrics and Gynecology

## 2014-11-23 ENCOUNTER — Encounter (HOSPITAL_COMMUNITY): Payer: Self-pay | Admitting: Emergency Medicine

## 2014-11-23 ENCOUNTER — Inpatient Hospital Stay (HOSPITAL_COMMUNITY): Payer: Medicare Other | Admitting: Anesthesiology

## 2014-11-23 ENCOUNTER — Encounter (HOSPITAL_COMMUNITY)
Admission: RE | Disposition: A | Discharge status: Home / Self Care | Payer: Self-pay | Source: Ambulatory Visit | Attending: Obstetrics and Gynecology

## 2014-11-23 DIAGNOSIS — Z90710 Acquired absence of both cervix and uterus: Secondary | ICD-10-CM

## 2014-11-23 DIAGNOSIS — Z79890 Hormone replacement therapy (postmenopausal): Secondary | ICD-10-CM | POA: Diagnosis not present

## 2014-11-23 DIAGNOSIS — Y92239 Unspecified place in hospital as the place of occurrence of the external cause: Secondary | ICD-10-CM | POA: Diagnosis not present

## 2014-11-23 DIAGNOSIS — R441 Visual hallucinations: Secondary | ICD-10-CM | POA: Diagnosis not present

## 2014-11-23 DIAGNOSIS — E785 Hyperlipidemia, unspecified: Secondary | ICD-10-CM | POA: Diagnosis present

## 2014-11-23 DIAGNOSIS — N8110 Cystocele, unspecified: Secondary | ICD-10-CM | POA: Diagnosis not present

## 2014-11-23 DIAGNOSIS — N736 Female pelvic peritoneal adhesions (postinfective): Secondary | ICD-10-CM | POA: Diagnosis not present

## 2014-11-23 DIAGNOSIS — Z9889 Other specified postprocedural states: Secondary | ICD-10-CM

## 2014-11-23 DIAGNOSIS — Z79899 Other long term (current) drug therapy: Secondary | ICD-10-CM

## 2014-11-23 DIAGNOSIS — I10 Essential (primary) hypertension: Secondary | ICD-10-CM | POA: Diagnosis present

## 2014-11-23 DIAGNOSIS — N393 Stress incontinence (female) (male): Secondary | ICD-10-CM | POA: Diagnosis present

## 2014-11-23 DIAGNOSIS — D649 Anemia, unspecified: Secondary | ICD-10-CM | POA: Diagnosis not present

## 2014-11-23 DIAGNOSIS — R350 Frequency of micturition: Secondary | ICD-10-CM | POA: Diagnosis present

## 2014-11-23 DIAGNOSIS — T402X5A Adverse effect of other opioids, initial encounter: Secondary | ICD-10-CM | POA: Diagnosis not present

## 2014-11-23 DIAGNOSIS — Z9049 Acquired absence of other specified parts of digestive tract: Secondary | ICD-10-CM | POA: Diagnosis present

## 2014-11-23 DIAGNOSIS — N993 Prolapse of vaginal vault after hysterectomy: Principal | ICD-10-CM | POA: Diagnosis present

## 2014-11-23 DIAGNOSIS — N830 Follicular cyst of ovary: Secondary | ICD-10-CM | POA: Diagnosis not present

## 2014-11-23 DIAGNOSIS — Z23 Encounter for immunization: Secondary | ICD-10-CM | POA: Diagnosis not present

## 2014-11-23 HISTORY — PX: LYSIS OF ADHESION: SHX5961

## 2014-11-23 HISTORY — PX: SALPINGOOPHORECTOMY: SHX82

## 2014-11-23 HISTORY — PX: ABDOMINAL SACROCOLPOPEXY: SHX1114

## 2014-11-23 HISTORY — PX: CYSTOSCOPY: SHX5120

## 2014-11-23 HISTORY — PX: ANTERIOR AND POSTERIOR REPAIR: SHX5121

## 2014-11-23 HISTORY — PX: BLADDER SUSPENSION: SHX72

## 2014-11-23 SURGERY — SACROCOLPOPEXY, ABDOMINAL
Anesthesia: General | Site: Abdomen

## 2014-11-23 MED ORDER — DEXAMETHASONE SODIUM PHOSPHATE 10 MG/ML IJ SOLN
INTRAMUSCULAR | Status: DC | PRN
  Administered 2014-11-23: 4 mg via INTRAVENOUS

## 2014-11-23 MED ORDER — ONDANSETRON HCL 4 MG/2ML IJ SOLN
INTRAMUSCULAR | Status: AC
  Filled 2014-11-23: qty 2

## 2014-11-23 MED ORDER — ESTRADIOL 0.1 MG/GM VA CREA
TOPICAL_CREAM | VAGINAL | Status: AC
  Filled 2014-11-23: qty 42.5

## 2014-11-23 MED ORDER — SODIUM CHLORIDE 0.9 % IJ SOLN: 9.0000 mL | INTRAMUSCULAR | Status: DC | PRN

## 2014-11-23 MED ORDER — PHENYLEPHRINE HCL 10 MG/ML IJ SOLN
INTRAMUSCULAR | Status: DC | PRN
  Administered 2014-11-23: 40 ug via INTRAVENOUS

## 2014-11-23 MED ORDER — LIDOCAINE-EPINEPHRINE 1 %-1:100000 IJ SOLN
INTRAMUSCULAR | Status: AC
  Filled 2014-11-23: qty 1

## 2014-11-23 MED ORDER — GLYCOPYRROLATE 0.2 MG/ML IJ SOLN
INTRAMUSCULAR | Status: AC
  Filled 2014-11-23: qty 2

## 2014-11-23 MED ORDER — IBUPROFEN 600 MG PO TABS
600.0000 mg | ORAL_TABLET | Freq: Four times a day (QID) | ORAL | Status: DC | PRN
  Administered 2014-11-24 (×2): 600 mg via ORAL
  Filled 2014-11-23 (×2): qty 1

## 2014-11-23 MED ORDER — MORPHINE SULFATE 1 MG/ML IV SOLN
INTRAVENOUS | Status: DC
  Administered 2014-11-23: 14:00:00 via INTRAVENOUS
  Administered 2014-11-23: 5 mg via INTRAVENOUS
  Administered 2014-11-23: 7 mg via INTRAVENOUS
  Administered 2014-11-24 (×2): 2 mg via INTRAVENOUS
  Filled 2014-11-23: qty 25

## 2014-11-23 MED ORDER — ONDANSETRON HCL 4 MG/2ML IJ SOLN: 4.0000 mg | Freq: Four times a day (QID) | INTRAMUSCULAR | Status: DC | PRN

## 2014-11-23 MED ORDER — LIDOCAINE HCL (CARDIAC) 20 MG/ML IV SOLN
INTRAVENOUS | Status: AC
  Filled 2014-11-23: qty 5

## 2014-11-23 MED ORDER — FENTANYL CITRATE (PF) 100 MCG/2ML IJ SOLN
INTRAMUSCULAR | Status: DC | PRN
  Administered 2014-11-23: 25 ug via INTRAVENOUS
  Administered 2014-11-23: 50 ug via INTRAVENOUS
  Administered 2014-11-23 (×3): 25 ug via INTRAVENOUS
  Administered 2014-11-23 (×2): 50 ug via INTRAVENOUS

## 2014-11-23 MED ORDER — SUCCINYLCHOLINE CHLORIDE 20 MG/ML IJ SOLN
INTRAMUSCULAR | Status: DC | PRN
  Administered 2014-11-23: 80 mg via INTRAVENOUS

## 2014-11-23 MED ORDER — GLYCOPYRROLATE 0.2 MG/ML IJ SOLN
INTRAMUSCULAR | Status: DC | PRN
  Administered 2014-11-23 (×3): 0.2 mg via INTRAVENOUS

## 2014-11-23 MED ORDER — MENTHOL 3 MG MT LOZG: 1.0000 | LOZENGE | OROMUCOSAL | Status: DC | PRN

## 2014-11-23 MED ORDER — PROPOFOL 10 MG/ML IV BOLUS
INTRAVENOUS | Status: DC | PRN
  Administered 2014-11-23: 20 mg via INTRAVENOUS
  Administered 2014-11-23: 100 mg via INTRAVENOUS

## 2014-11-23 MED ORDER — OXYCODONE-ACETAMINOPHEN 5-325 MG PO TABS
1.0000 | ORAL_TABLET | ORAL | Status: DC | PRN
  Administered 2014-11-24 – 2014-11-25 (×4): 1 via ORAL
  Filled 2014-11-23 (×4): qty 1

## 2014-11-23 MED ORDER — KETOROLAC TROMETHAMINE 30 MG/ML IJ SOLN
INTRAMUSCULAR | Status: AC
  Filled 2014-11-23: qty 1

## 2014-11-23 MED ORDER — LIDOCAINE HCL (CARDIAC) 20 MG/ML IV SOLN
INTRAVENOUS | Status: DC | PRN
  Administered 2014-11-23: 60 mg via INTRAVENOUS

## 2014-11-23 MED ORDER — PHENYLEPHRINE 40 MCG/ML (10ML) SYRINGE FOR IV PUSH (FOR BLOOD PRESSURE SUPPORT)
PREFILLED_SYRINGE | INTRAVENOUS | Status: AC
  Filled 2014-11-23: qty 10

## 2014-11-23 MED ORDER — SCOPOLAMINE 1 MG/3DAYS TD PT72
MEDICATED_PATCH | TRANSDERMAL | Status: AC
  Administered 2014-11-23: 1.5 mg via TRANSDERMAL
  Filled 2014-11-23: qty 1

## 2014-11-23 MED ORDER — ROCURONIUM BROMIDE 100 MG/10ML IV SOLN
INTRAVENOUS | Status: DC | PRN
  Administered 2014-11-23: 30 mg via INTRAVENOUS

## 2014-11-23 MED ORDER — FENTANYL CITRATE (PF) 250 MCG/5ML IJ SOLN
INTRAMUSCULAR | Status: AC
  Filled 2014-11-23: qty 5

## 2014-11-23 MED ORDER — ROCURONIUM BROMIDE 100 MG/10ML IV SOLN
INTRAVENOUS | Status: AC
  Filled 2014-11-23: qty 1

## 2014-11-23 MED ORDER — NEOSTIGMINE METHYLSULFATE 10 MG/10ML IV SOLN
INTRAVENOUS | Status: DC | PRN
  Administered 2014-11-23: 1.5 mg via INTRAVENOUS

## 2014-11-23 MED ORDER — METHYLENE BLUE 1 % INJ SOLN
INTRAMUSCULAR | Status: AC
  Filled 2014-11-23: qty 1

## 2014-11-23 MED ORDER — KETOROLAC TROMETHAMINE 15 MG/ML IJ SOLN
15.0000 mg | Freq: Four times a day (QID) | INTRAMUSCULAR | Status: AC
  Administered 2014-11-23 – 2014-11-24 (×3): 15 mg via INTRAVENOUS
  Filled 2014-11-23 (×6): qty 1

## 2014-11-23 MED ORDER — LACTATED RINGERS IV SOLN: INTRAVENOUS | Status: DC

## 2014-11-23 MED ORDER — 0.9 % SODIUM CHLORIDE (POUR BTL) OPTIME
TOPICAL | Status: DC | PRN
  Administered 2014-11-23: 1000 mL

## 2014-11-23 MED ORDER — LACTATED RINGERS IV SOLN
INTRAVENOUS | Status: DC
  Administered 2014-11-23 (×2): via INTRAVENOUS

## 2014-11-23 MED ORDER — DEXAMETHASONE SODIUM PHOSPHATE 4 MG/ML IJ SOLN
INTRAMUSCULAR | Status: AC
  Filled 2014-11-23: qty 1

## 2014-11-23 MED ORDER — LACTATED RINGERS IV SOLN
INTRAVENOUS | Status: DC
Start: 2014-11-23 — End: 2014-11-24
  Administered 2014-11-23 – 2014-11-24 (×2): via INTRAVENOUS

## 2014-11-23 MED ORDER — ONDANSETRON HCL 4 MG/2ML IJ SOLN
INTRAMUSCULAR | Status: DC | PRN
  Administered 2014-11-23: 4 mg via INTRAVENOUS

## 2014-11-23 MED ORDER — FENTANYL CITRATE (PF) 100 MCG/2ML IJ SOLN
25.0000 ug | INTRAMUSCULAR | Status: DC | PRN
  Administered 2014-11-23 (×3): 50 ug via INTRAVENOUS

## 2014-11-23 MED ORDER — SCOPOLAMINE 1 MG/3DAYS TD PT72
1.0000 | MEDICATED_PATCH | Freq: Once | TRANSDERMAL | Status: DC
  Administered 2014-11-23: 1.5 mg via TRANSDERMAL

## 2014-11-23 MED ORDER — ONDANSETRON HCL 4 MG PO TABS: 4.0000 mg | ORAL_TABLET | Freq: Four times a day (QID) | ORAL | Status: DC | PRN

## 2014-11-23 MED ORDER — MIDAZOLAM HCL 2 MG/2ML IJ SOLN
INTRAMUSCULAR | Status: DC | PRN
  Administered 2014-11-23 (×2): 1 mg via INTRAVENOUS

## 2014-11-23 MED ORDER — LIDOCAINE-EPINEPHRINE 1 %-1:100000 IJ SOLN
INTRAMUSCULAR | Status: DC | PRN
  Administered 2014-11-23: 6 mL

## 2014-11-23 MED ORDER — NEOSTIGMINE METHYLSULFATE 10 MG/10ML IV SOLN
INTRAVENOUS | Status: AC
  Filled 2014-11-23: qty 1

## 2014-11-23 MED ORDER — FENTANYL CITRATE (PF) 100 MCG/2ML IJ SOLN
INTRAMUSCULAR | Status: AC
  Filled 2014-11-23: qty 2

## 2014-11-23 MED ORDER — KETOROLAC TROMETHAMINE 30 MG/ML IJ SOLN
INTRAMUSCULAR | Status: AC
  Administered 2014-11-23: 15 mg via INTRAVENOUS
  Filled 2014-11-23: qty 1

## 2014-11-23 MED ORDER — DIPHENHYDRAMINE HCL 12.5 MG/5ML PO ELIX: 12.5000 mg | ORAL_SOLUTION | Freq: Four times a day (QID) | ORAL | Status: DC | PRN

## 2014-11-23 MED ORDER — DIPHENHYDRAMINE HCL 50 MG/ML IJ SOLN: 12.5000 mg | Freq: Four times a day (QID) | INTRAMUSCULAR | Status: DC | PRN

## 2014-11-23 MED ORDER — ONDANSETRON HCL 4 MG/2ML IJ SOLN: 4.0000 mg | Freq: Once | INTRAMUSCULAR | Status: DC | PRN

## 2014-11-23 MED ORDER — ESTRADIOL 0.1 MG/GM VA CREA
TOPICAL_CREAM | VAGINAL | Status: DC | PRN
  Administered 2014-11-23: 1 via VAGINAL

## 2014-11-23 MED ORDER — GLYCOPYRROLATE 0.2 MG/ML IJ SOLN
INTRAMUSCULAR | Status: AC
  Filled 2014-11-23: qty 1

## 2014-11-23 MED ORDER — NALOXONE HCL 0.4 MG/ML IJ SOLN: 0.4000 mg | INTRAMUSCULAR | Status: DC | PRN

## 2014-11-23 MED ORDER — MIDAZOLAM HCL 2 MG/2ML IJ SOLN
INTRAMUSCULAR | Status: AC
Start: 2014-11-23 — End: 2014-11-23
  Filled 2014-11-23: qty 2

## 2014-11-23 MED ORDER — SUCCINYLCHOLINE CHLORIDE 20 MG/ML IJ SOLN
INTRAMUSCULAR | Status: AC
  Filled 2014-11-23: qty 1

## 2014-11-23 MED ORDER — PROPOFOL 10 MG/ML IV BOLUS
INTRAVENOUS | Status: AC
  Filled 2014-11-23: qty 20

## 2014-11-23 MED ORDER — STERILE WATER FOR IRRIGATION IR SOLN
Status: DC | PRN
  Administered 2014-11-23: 1000 mL

## 2014-11-23 SURGICAL SUPPLY — 72 items
APL SKNCLS STERI-STRIP NONHPOA (GAUZE/BANDAGES/DRESSINGS) ×3
BENZOIN TINCTURE PRP APPL 2/3 (GAUZE/BANDAGES/DRESSINGS) ×2 IMPLANT
BLADE SURG 11 STRL SS (BLADE) ×5 IMPLANT
CANISTER SUCT 3000ML (MISCELLANEOUS) ×5 IMPLANT
CATH FOLEY 2WAY SLVR  5CC 18FR (CATHETERS) ×2
CATH FOLEY 2WAY SLVR 5CC 18FR (CATHETERS) ×3 IMPLANT
CATH FOLEY 3WAY 30CC 18FR (CATHETERS) ×2 IMPLANT
CLOSURE WOUND 1/2 X4 (GAUZE/BANDAGES/DRESSINGS) ×1
CLOTH BEACON ORANGE TIMEOUT ST (SAFETY) ×5 IMPLANT
CONT PATH 16OZ SNAP LID 3702 (MISCELLANEOUS) ×2 IMPLANT
DECANTER SPIKE VIAL GLASS SM (MISCELLANEOUS) IMPLANT
DEVICE CAPIO SLIM SINGLE (INSTRUMENTS) IMPLANT
DISSECTOR SPONGE CHERRY (GAUZE/BANDAGES/DRESSINGS) ×7 IMPLANT
DRAPE UNDERBUTTOCKS STRL (DRAPE) ×5 IMPLANT
DRAPE WARM FLUID 44X44 (DRAPE) ×2 IMPLANT
DRSG OPSITE POSTOP 4X10 (GAUZE/BANDAGES/DRESSINGS) ×5 IMPLANT
ELECT BLADE 6 FLAT ULTRCLN (ELECTRODE) IMPLANT
ELECT BLADE 6.5 EXT (BLADE) ×2 IMPLANT
FILTER STRAW FLUID ASPIR (MISCELLANEOUS) IMPLANT
GAUZE PACKING 1 X5 YD ST (GAUZE/BANDAGES/DRESSINGS) ×2 IMPLANT
GAUZE PACKING 2X5 YD STRL (GAUZE/BANDAGES/DRESSINGS) ×5 IMPLANT
GAUZE SPONGE 4X4 16PLY XRAY LF (GAUZE/BANDAGES/DRESSINGS) ×5 IMPLANT
GLOVE BIO SURGEON STRL SZ 6.5 (GLOVE) ×8 IMPLANT
GLOVE BIO SURGEONS STRL SZ 6.5 (GLOVE) ×2
GLOVE BIOGEL PI IND STRL 6.5 (GLOVE) ×3 IMPLANT
GLOVE BIOGEL PI INDICATOR 6.5 (GLOVE) ×2
GOWN STRL REUS W/TWL LRG LVL3 (GOWN DISPOSABLE) ×20 IMPLANT
GRAFT Y-MESH ALYTE (Mesh General) ×2 IMPLANT
LIQUID BAND (GAUZE/BANDAGES/DRESSINGS) ×5 IMPLANT
NDL MAYO 6 CRC TAPER PT (NEEDLE) IMPLANT
NEEDLE HYPO 22GX1.5 SAFETY (NEEDLE) ×5 IMPLANT
NEEDLE MAYO 6 CRC TAPER PT (NEEDLE) IMPLANT
NS IRRIG 1000ML POUR BTL (IV SOLUTION) ×5 IMPLANT
PACK ABDOMINAL GYN (CUSTOM PROCEDURE TRAY) ×5 IMPLANT
PACK VAGINAL MINOR WOMEN LF (CUSTOM PROCEDURE TRAY) ×5 IMPLANT
PACK VAGINAL WOMENS (CUSTOM PROCEDURE TRAY) ×5 IMPLANT
PAD MAGNETIC INST (MISCELLANEOUS) ×5 IMPLANT
PLUG CATH AND CAP STER (CATHETERS) ×5 IMPLANT
RETRACTOR STAY HOOK 5MM (MISCELLANEOUS) ×5 IMPLANT
SET CYSTO W/LG BORE CLAMP LF (SET/KITS/TRAYS/PACK) ×5 IMPLANT
SHEET LAVH (DRAPES) ×5 IMPLANT
SLING TVT EXACT (Sling) ×2 IMPLANT
SPONGE GAUZE 4X4 12PLY STER LF (GAUZE/BANDAGES/DRESSINGS) ×10 IMPLANT
SPONGE LAP 18X18 X RAY DECT (DISPOSABLE) ×10 IMPLANT
SPONGE LAP 4X18 X RAY DECT (DISPOSABLE) ×5 IMPLANT
SPONGE SURGIFOAM ABS GEL 12-7 (HEMOSTASIS) ×2 IMPLANT
STAPLER VISISTAT 35W (STAPLE) ×5 IMPLANT
STRIP CLOSURE SKIN 1/2X4 (GAUZE/BANDAGES/DRESSINGS) ×1 IMPLANT
SURGIFLO TRUKIT (HEMOSTASIS) IMPLANT
SURGIFLO W/THROMBIN 8M KIT (HEMOSTASIS) ×2 IMPLANT
SUT CAPIO ETHIBPND (SUTURE) IMPLANT
SUT ETHIBOND 0 (SUTURE) ×40 IMPLANT
SUT PDS AB 0 CT1 27 (SUTURE) IMPLANT
SUT VIC AB 0 CT1 18XCR BRD8 (SUTURE) ×6 IMPLANT
SUT VIC AB 0 CT1 27 (SUTURE) ×15
SUT VIC AB 0 CT1 27XBRD ANBCTR (SUTURE) ×9 IMPLANT
SUT VIC AB 0 CT1 8-18 (SUTURE) ×10
SUT VIC AB 2-0 CT1 (SUTURE) ×5 IMPLANT
SUT VIC AB 2-0 CT1 27 (SUTURE)
SUT VIC AB 2-0 CT1 TAPERPNT 27 (SUTURE) IMPLANT
SUT VIC AB 2-0 CT2 27 (SUTURE) IMPLANT
SUT VIC AB 2-0 SH 27 (SUTURE) ×30
SUT VIC AB 2-0 SH 27XBRD (SUTURE) ×12 IMPLANT
SUT VIC AB 2-0 UR6 27 (SUTURE) IMPLANT
SYR 30ML LL (SYRINGE) IMPLANT
SYR 50ML LL SCALE MARK (SYRINGE) ×5 IMPLANT
TOWEL OR 17X24 6PK STRL BLUE (TOWEL DISPOSABLE) ×10 IMPLANT
TRAY FOLEY BAG SILVER LF 16FR (SET/KITS/TRAYS/PACK) ×5 IMPLANT
TRAY FOLEY CATH SILVER 14FR (SET/KITS/TRAYS/PACK) ×2 IMPLANT
TUBING NON-CON 1/4 X 20 CONN (TUBING) ×4 IMPLANT
TUBING NON-CON 1/4 X 20' CONN (TUBING) ×1
WATER STERILE IRR 1000ML POUR (IV SOLUTION) ×5 IMPLANT

## 2014-11-23 NOTE — Progress Notes (Signed)
Update to History and Physical  No marked change in status since office pre-op visit.   Patient examined.   OK to proceed with surgery. 

## 2014-11-23 NOTE — Anesthesia Postprocedure Evaluation (Signed)
  Anesthesia Post-op Note  Patient: Bridget Strong  Procedure(s) Performed: Procedure(s): ABDOMINAL SACROCOLPOPEXY/HALBAN'S CULDOPLASTY (N/A) BILATERAL SALPINGO OOPHORECTOMY (Bilateral) ANTERIOR (CYSTOCELE)  (N/A) EXACT MIDURETERAL SLING (N/A) CYSTOSCOPY (N/A) LYSIS OF ADHESION (N/A)  Patient Location: [facility]  Anesthesia Type:General  Level of Consciousness: awake, alert  and oriented  Airway and Oxygen Therapy: Patient Spontanous Breathing and Patient connected to nasal cannula oxygen  Post-op Pain: none  Post-op Assessment: Post-op Vital signs reviewed and Patient's Cardiovascular Status Stable              Post-op Vital Signs: Reviewed and stable  Last Vitals:  Filed Vitals:   11/23/14 1535  BP: 127/53  Pulse: 69  Temp: 37.4 C  Resp: 15    Complications: No apparent anesthesia complications

## 2014-11-23 NOTE — Addendum Note (Signed)
Addendum  created 11/23/14 1557 by Hewitt Blade, CRNA   Modules edited: Notes Section   Notes Section:  File: [id number]

## 2014-11-23 NOTE — Anesthesia Procedure Notes (Signed)
Procedure Name: Intubation Date/Time: 11/23/2014 7:36 AM Performed by: Raenette Rover Pre-anesthesia Checklist: Patient identified, Patient being monitored, Emergency Drugs available and Suction available Patient Re-evaluated:Patient Re-evaluated prior to inductionOxygen Delivery Method: Circle system utilized Preoxygenation: Pre-oxygenation with 100% oxygen Intubation Type: IV induction Ventilation: Two handed mask ventilation required and Oral airway inserted - appropriate to patient size Laryngoscope Size: Sabra Heck and 2 Grade View: Grade I Tube type: Oral Tube size: 7.0 mm Number of attempts: 1 Airway Equipment and Method: Stylet Placement Confirmation: ETT inserted through vocal cords under direct vision,  breath sounds checked- equal and bilateral,  positive ETCO2 and CO2 detector Secured at: 21 cm Tube secured with: Tape Dental Injury: Teeth and Oropharynx as per pre-operative assessment  Future Recommendations: Recommend- induction with short-acting agent, and alternative techniques readily available

## 2014-11-23 NOTE — Transfer of Care (Signed)
Immediate Anesthesia Transfer of Care Note  Patient: Bridget Strong  Procedure(s) Performed: Procedure(s): ABDOMINAL SACROCOLPOPEXY/HALBAN'S CULDOPLASTY (N/A) BILATERAL SALPINGO OOPHORECTOMY (Bilateral) ANTERIOR (CYSTOCELE)  (N/A) EXACT MIDURETERAL SLING (N/A) CYSTOSCOPY (N/A) LYSIS OF ADHESION (N/A)  Patient Location: PACU  Anesthesia Type:General  Level of Consciousness: awake, alert , oriented and patient cooperative  Airway & Oxygen Therapy: Patient Spontanous Breathing and Patient connected to nasal cannula oxygen  Post-op Assessment: Report given to RN and Post -op Vital signs reviewed and stable  Post vital signs: Reviewed and stable  Last Vitals:  Filed Vitals:   11/23/14 0605  BP: 134/60  Pulse: 60  Temp: 36.6 C  Resp: 16    Complications: No apparent anesthesia complications

## 2014-11-23 NOTE — Brief Op Note (Signed)
11/23/2014  12:45 PM  PATIENT:  Bridget Strong  64 y.o. female  PRE-OPERATIVE DIAGNOSIS:  vaginal vault prolapse, stress incontinence  POST-OPERATIVE DIAGNOSIS:  vaginal vault prolapse, pelvic and abdominal adhesions, stress incontinence  PROCEDURE:  Procedure(s): ABDOMINAL SACROCOLPOPEXY/HALBAN'S CULDOPLASTY (N/A) BILATERAL SALPINGO OOPHORECTOMY (Bilateral) ANTERIOR (CYSTOCELE)  (N/A) EXACT MIDURETERAL SLING (N/A) CYSTOSCOPY (N/A) LYSIS OF ADHESION (N/A)  SURGEON:  Surgeon(s) and Role:    * Jared Whorley E Yisroel Ramming, MD - Primary    * Salvadore Dom, MD  PHYSICIAN ASSISTANT: NA  ASSISTANTS: Sumner Boast, MD   ANESTHESIA:   local and general  EBL:  Total I/O In: 2100 [I.V.:2100] Out: 900 [Urine:700; Blood:200]  BLOOD ADMINISTERED:none  DRAINS: Urinary Catheter (Foley)   LOCAL MEDICATIONS USED:  LIDOCAINE with epinephrine 1:100,000  SPECIMEN:  Source of Specimen:  Bilateral tubes and ovaries.   DISPOSITION OF SPECIMEN:  PATHOLOGY  COUNTS:  YES  TOURNIQUET:  * No tourniquets in log *  DICTATION: .Other Dictation: Dictation Number    PLAN OF CARE: Admit to inpatient   PATIENT DISPOSITION:  PACU - hemodynamically stable.   Delay start of Pharmacological VTE agent (>24hrs) due to surgical blood loss or risk of bleeding: not applicable

## 2014-11-23 NOTE — Anesthesia Postprocedure Evaluation (Signed)
  Anesthesia Post-op Note  Patient: Bridget Strong  Procedure(s) Performed: Procedure(s) (LRB): ABDOMINAL SACROCOLPOPEXY/HALBAN'S CULDOPLASTY (N/A) BILATERAL SALPINGO OOPHORECTOMY (Bilateral) ANTERIOR (CYSTOCELE)  (N/A) EXACT MIDURETERAL SLING (N/A) CYSTOSCOPY (N/A) LYSIS OF ADHESION (N/A)  Patient Location: PACU  Anesthesia Type: General  Level of Consciousness: awake and alert   Airway and Oxygen Therapy: Patient Spontanous Breathing  Post-op Pain: mild  Post-op Assessment: Post-op Vital signs reviewed, Patient's Cardiovascular Status Stable, Respiratory Function Stable, Patent Airway and No signs of Nausea or vomiting  Last Vitals:  Filed Vitals:   11/23/14 1230  BP:   Pulse: 67  Temp: 37.3 C  Resp: 21    Post-op Vital Signs: stable   Complications: No apparent anesthesia complications

## 2014-11-23 NOTE — Progress Notes (Signed)
Day of Surgery Procedure(s) (LRB): ABDOMINAL SACROCOLPOPEXY/HALBAN'S CULDOPLASTY (N/A) BILATERAL SALPINGO OOPHORECTOMY (Bilateral) ANTERIOR (CYSTOCELE)  (N/A) EXACT MIDURETERAL SLING (N/A) CYSTOSCOPY (N/A) LYSIS OF ADHESION (N/A)  Subjective: Patient reports incisional pain.   Pain well controlled at this time.  Ate some [place] ice.  Not out of bed yet.  Asking about surgery.    Objective: I have reviewed patient's vital signs and intake and output. T 99.3, BP 127/53, P 69, RR 15 I/O - 2300 cc/1100 cc.  General: alert and cooperative Resp: clear to auscultation bilaterally Cardio: regular rate and rhythm, S1, S2 normal, no murmur, click, rub or gallop GI: soft, non-tender; bowel sounds normal; no masses,  no organomegaly and incision: clean, dry and intact Extremities: PAS and ted hose on.  DPs 1 + bilaterally.  Vaginal Bleeding: none  Assessment: s/p Procedure(s): ABDOMINAL SACROCOLPOPEXY/HALBAN'S CULDOPLASTY (N/A) BILATERAL SALPINGO OOPHORECTOMY (Bilateral) ANTERIOR (CYSTOCELE)  (N/A) EXACT MIDURETERAL SLING (N/A) CYSTOSCOPY (N/A) LYSIS OF ADHESION (N/A): stable  Plan: Clear liquids Will ambulate later.  Encouraged use of incentive spirometer. Foley to drainage.  Vaginal packing overnight.  CBC and BMP in am.  Surgical findings and procedure discussed.   Questions answered.    LOS: 0 days    Arloa Koh 11/23/2014, 4:10 PM

## 2014-11-24 ENCOUNTER — Encounter (HOSPITAL_COMMUNITY): Payer: Self-pay | Admitting: Obstetrics and Gynecology

## 2014-11-24 LAB — BASIC METABOLIC PANEL
ANION GAP: 4 — AB (ref 5–15)
BUN: 11 mg/dL (ref 6–20)
CALCIUM: 7.7 mg/dL — AB (ref 8.9–10.3)
CHLORIDE: 106 mmol/L (ref 101–111)
CO2: 29 mmol/L (ref 22–32)
Creatinine, Ser: 0.74 mg/dL (ref 0.44–1.00)
GFR calc non Af Amer: >60 mL/min (ref >60)
Glucose, Bld: 107 mg/dL — ABNORMAL HIGH (ref 65–99)
Potassium: 3.3 mmol/L — ABNORMAL LOW (ref 3.5–5.1)
Sodium: 139 mmol/L (ref 135–145)

## 2014-11-24 LAB — CBC
HEMATOCRIT: 27.7 % — AB (ref 36.0–46.0)
Hemoglobin: 9.2 g/dL — ABNORMAL LOW (ref 12.0–15.0)
MCH: 31.2 pg (ref 26.0–34.0)
MCHC: 33.2 g/dL (ref 30.0–36.0)
MCV: 93.9 fL (ref 78.0–100.0)
PLATELETS: 219 10*3/uL (ref 150–400)
RBC: 2.95 MIL/uL — ABNORMAL LOW (ref 3.87–5.11)
RDW: 13.3 % (ref 11.5–15.5)
WBC: 6.7 10*3/uL (ref 4.0–10.5)

## 2014-11-24 MED ORDER — PRAVASTATIN SODIUM 40 MG PO TABS
40.0000 mg | ORAL_TABLET | Freq: Every day | ORAL | Status: DC
  Administered 2014-11-24: 40 mg via ORAL
  Filled 2014-11-24: qty 1

## 2014-11-24 MED ORDER — PNEUMOCOCCAL VAC POLYVALENT 25 MCG/0.5ML IJ INJ
0.5000 mL | INJECTION | INTRAMUSCULAR | Status: AC
  Administered 2014-11-25: 0.5 mL via INTRAMUSCULAR
  Filled 2014-11-24: qty 0.5

## 2014-11-24 NOTE — Progress Notes (Signed)
1 Day Post-Op Procedure(s) (LRB): ABDOMINAL SACROCOLPOPEXY/HALBAN'S CULDOPLASTY (N/A) BILATERAL SALPINGO OOPHORECTOMY (Bilateral) ANTERIOR (CYSTOCELE)  (N/A) EXACT MIDURETERAL SLING (N/A) CYSTOSCOPY (N/A) LYSIS OF ADHESION (N/A)  Subjective: Patient reports good pain control.  Ambulated last hs.  Vaginal packing is out.  Hungry.  Objective: I have reviewed patient's vital signs, intake and output and labs. T max 00.9, T now 99.2, BP 94/64, P 75, RR 15 I/O - 5181.10/2073 cc Hgb 9.2, K 3.3  General: alert and cooperative Resp: clear to auscultation bilaterally Cardio: regular rate and rhythm, S1, S2 normal, no murmur, click, rub or gallop GI: soft, non-tender; bowel sounds normal; no masses,  no organomegaly and incision: clean, dry and intact Vaginal Bleeding: minimal  Assessment: s/p Procedure(s): ABDOMINAL SACROCOLPOPEXY/HALBAN'S CULDOPLASTY (N/A) BILATERAL SALPINGO OOPHORECTOMY (Bilateral) ANTERIOR (CYSTOCELE)  (N/A) EXACT MIDURETERAL SLING (N/A) CYSTOSCOPY (N/A) LYSIS OF ADHESION (N/A): stable  Plan: Advance diet Encourage ambulation Advance to PO medication Discontinue IV fluids Continue foley due to post op state and extensive pelvic reconstruction.  CBC in am tomorrow. Bladder training tomorrow.  LOS: 1 day    Brook Matthew Saras 11/24/2014, 7:57 AM

## 2014-11-24 NOTE — Progress Notes (Signed)
1 Day Post-Op Procedure(s) (LRB): ABDOMINAL SACROCOLPOPEXY/HALBAN'S CULDOPLASTY (N/A) BILATERAL SALPINGO OOPHORECTOMY (Bilateral) ANTERIOR (CYSTOCELE)  (N/A) EXACT MIDURETERAL SLING (N/A) CYSTOSCOPY (N/A) LYSIS OF ADHESION (N/A)  Subjective: Patient reports tolerating PO.   Ambulating.  Having some visual hallucinations during the entire day today.  Feel like they have reduced since stopping the morphine.  Took Percocet this afternoon.  Objective: I have reviewed patient's vital signs and intake and output.  General: alert and cooperative Neuro - Oriented and oriented x 3.   Assessment: s/p Procedure(s): ABDOMINAL SACROCOLPOPEXY/HALBAN'S CULDOPLASTY (N/A) BILATERAL SALPINGO OOPHORECTOMY (Bilateral) ANTERIOR (CYSTOCELE)  (N/A) EXACT MIDURETERAL SLING (N/A) CYSTOSCOPY (N/A) LYSIS OF ADHESION (N/A): progressing well Hallucinations with narcotic use.   Plan: Encourage ambulation  Will continue with Percocet for now as has intolerance to Vicodin.  Continue Motrin.  Bladder training tomorrow.   LOS: 1 day    Arloa Koh 11/24/2014, 5:53 PM

## 2014-11-24 NOTE — Op Note (Signed)
Bridget Strong, Bridget Strong NO.:  [id number]  MEDICAL RECORD NO.:  [id number]  LOCATION:  9306                          FACILITY:  [facility]  PHYSICIAN:  Lenard Galloway, M.D.   DATE OF BIRTH:  11/29/1950  DATE OF PROCEDURE:  11/23/2014 DATE OF DISCHARGE:                              OPERATIVE REPORT   PREOPERATIVE DIAGNOSIS:  Complete vaginal vault prolapse, occult genuine stress incontinence.  POSTOPERATIVE DIAGNOSES:  Complete vaginal vault prolapse, occult genuine stress incontinence, pelvic and abdominal adhesions.  PROCEDURE:  Abdominal sacral colpopexy, Halban's culdoplasty, bilateral salpingo-oophorectomy, intraabdominal lysis of adhesions TVT Exact mid urethral sling and cystoscopy, anterior colporrhaphy.  SURGEON:  Lenard Galloway, MD  ASSISTANT:  Sumner Boast, MD.  ANESTHESIA:  General endotracheal, local with 1% lidocaine with epinephrine 1:100,000.  IV FLUIDS:  2100 mL Ringer's lactate.  EBL:  200 mL.  URINE OUTPUT:  700 mL.  COMPLICATIONS:  None.  INDICATIONS FOR THE PROCEDURE:  The patient is a 64 year old gravida 70, para 3-0-1-3 Caucasian female, who was referred by her primary gynecologist, Dr. Gus Height for evaluation and treatment of vaginal vault prolapse.  The patient was status post vaginal hysterectomy with cystocele and rectocele repair in 1998.  The patient had failure of use of a pessary, which caused significant vaginal mucosal erosion.  The patient wished for surgical treatment.  She did undergo multichannel urodynamic testing in the office with reduction of the prolapse and this documented occult genuine stress incontinence.  The patient is having normal bowel movements and bowel control.  On exam, the patient was noted to have complete vaginal vault eversion extending approximately 10 cm outside of the introitus.  A plan is now made to proceed with an abdominal sacrocolpopexy, Halban's culdoplasty, bilateral  salpingo-oophorectomy, anterior and posterior colporrhaphy and a TVT Exact mid urethral sling with cystoscopy.  Risks, benefits, and alternatives have been reviewed with the patient who wishes to proceed.  The patient has been advised about the use of permanent mesh material for the sacral colpopexy in the mid urethral sling.  She agrees to proceed.  FINDINGS:  Examination under anesthesia revealed complete vaginal vault prolapse.  The cervix and uterus were absent.  At the time of laparotomy, the patient was noted to have an absent uterus.  She has a right hydrosalpinx, a normal left fallopian tube, and normal ovaries bilaterally.  There was some adhesions of the left adnexa to the left sigmoid colon.  There were adhesions of the cecum to the right abdominal sidewall.  The appendix was unremarkable.  There was a curtain of adhesions across the upper abdomen which made it difficult to palpate the liver and kidney region.  Cystoscopy with placement of the mid urethral sling documented the absence of a foreign body in the bladder of the urethra.  The bladder was visualized throughout 360 degrees including the bladder dome and trigone.  The ureters were noted to be patent bilaterally.  SPECIMEN:  The bilateral tubes and ovaries were sent to pathology.   DESCRIPTION OF PROCEDURE:  The patient was reidentified in the preop hold area.  She received cefotetan 2 g IV for antibiotic prophylaxis and she received TED  hose and PAS stockings for DVT prophylaxis.  The patient was escorted to the operating room where she was placed in the dorsal lithotomy position with Allen stirrups.  General endotracheal anesthesia was induced.  The patient's abdomen and vagina and perineum were then sterilely prepped.  A 10/10 drape was placed across the anal opening as the patient was having some stooling from her preoperative bowel prep.  The patient was sterilely draped at this time.  A Pfannenstiel  incision was created sharply with a scalpel.  Dissection was carried down to the fascia using monopolar cautery for hemostasis. The fascia was incised in the midline and the incision was extended bilaterally using a Mayo scissors.  The rectus muscles were dissected off the fascia superiorly and inferiorly using monopolar cautery and sharp dissection.  The rectus muscles were then bluntly divided in the midline.  The parietal peritoneum was elevated with 2 hemostat clamps and entered sharply.  The peritoneal incision was extended cranially and caudally.  At this time, adhesions were encountered near the cecum.  These were sharply lysed.  The adhesions of the adnexa to the sigmoid colon were similarly sharply lysed.  There were also adhesions between the adnexa on the right and the bladder anteriorly.  These were also sharply excised.  At this time, the Foley catheter was removed and a 3-way Foley was placed in order to retrograde fill of the bladder, which confirmed the integrity of the bladder following the lysis of adhesions of the right adnexa.  At this time, the Balfour retractor was placed inside the peritoneal cavity.  The Foley catheter was replaced at this time and left to gravity drainage.  The bowel was packed into the upper abdomen using moistened lap pads.  The bilateral tubes and ovaries were removed at this time.  The left ureter was identified.  A Babcock clamp was used to elevate the adnexa. The retroperitoneal space was opened at this time.  The infundibular ligament was identified.  An opening was created in the peritoneum, so that 2 clamps could be placed across the infundibulopelvic ligament. This was sharply divided and was then free tied with 0 Vicryl followed by a suture ligature of the same.  A clamp was then placed across the remaining broad ligament, which held the left tube and ovary in place. It was sharply excised and it was then tied with a suture  ligature.  The specimen was set aside and later sent to Pathology.  The same procedure which was performed on the patient's left-hand side was performed on the right-hand side as well.  Care was taken to identify the right ureter prior to removal of the right tube and ovary.  Hemostasis was excellent in both adnexal regions and the tubes and ovaries were sent to Pathology together.  The EEA Sizer was placed inside the vagina at this time and the vagina was elevated upward inside the peritoneal cavity.  The peritoneum that was overlying the vaginal vault was opened at this time in an anterior- posterior direction using Metzenbaum scissors.  Sharp and blunt dissection were used to dissect the peritoneum off the vagina anteriorly, at the apex and posteriorly.  Hemostasis was created with monopolar cautery when needed.  The vagina was well exposed at this time.  The area over the sacral promontory was identified at this time.  The ureters, which had been identified early in the procedure prior to removal of the adnexal structures were re-identified.  The iliac vasculature on the patient's  left-hand side was similarly identified. The peritoneum overlying the sacral promontory was opened in a cranial caudal direction using a Metzenbaum scissors.  Dissection of the adventitia over the sacral promontory and the anterior longitudinal ligaments was performed bluntly and carefully at this time.  Eventually, the sacrum could be well identified along with the middle sacral vessels. The peritoneum was opened along the right pelvic sidewall and just to the right of the rectum so that the mesh material could be later tunneled and re-peritonealized. Hemostasis was created with monopolar cautery.  The Alyte Y-shaped mesh was placed at this time.  The mesh was sewn to the vagina using paired interrupted sutures of 0 Ethibond.  The EEA Sizer was used to elevate the vagina up and several pairs of suture  were placed along the anterior and the posterior vaginal walls in parallel. There was good application of the mesh to the underside of the vagina.  A Halban's culdoplasty was performed with a total of 3 sutures of 0 Ethibond, which were placed in a cranial caudal direction in the mid portion of the cul-de-sac.  This provided good closure of the posterior cul-de-sac.  The EEA Sizer was removed at this time and gloved hand was gently inserted into the vagina in order to plan for the amount of elevation of the vaginal cuff.  The EEA Sizer was then replaced.  A pair of 2 sutures of 0 Ethibond was placed in the anterior longitudinal ligament over the sacral promontory.  These were then fed through the Alyte mesh and this provided excellent elevation and support of the vagina.  The excess mesh was trimmed, both on the vaginal side and on the sacral promontory side. Hemostasis was good at this time.  The sacral colpopexy mesh was re-peritonealized at this time using a running suture of 2-0 Vicryl.  100% of the mesh was covered at termination of the procedure.  The pelvis was irrigated and suctioned and found to be with evidence of the small amount of bleeding of the left adnexal region along the left round ligament.  This responded to monopolar cautery.  All of the operative sites were hemostatic at this time.  The self-retaining retractor and the lap pads were removed.  The abdomen was closed.  The parietal peritoneum was closed with a running suture of 2-0 Vicryl.  The fascia was closed with a running suture of 0 Vicryl bilaterally.  The subcutaneous layer was irrigated, suctioned, and found to be hemostatic.  Interrupted sutures of 2-0 plain were placed in the subcutaneous layer. The skin was closed with a subcuticular suture of 4-0 Vicryl.  The incision was cleansed and then Steri-Strips and benzoin were placed over the incision.  The incision was covered at this time.  The  remainder of the procedure was performed vaginally.  The patient was placed in the high lithotomy position at this time.  The patient was examined and she was found to have excellent support of the vaginal vault.  There was a very minimal cystocele and there was no evidence of a rectocele at this time.  The decision was made to proceed with an anterior colporrhaphy and a TVT Exact mid urethral sling and cystoscopy.  A Deaver retractor was used to give exposure to the anterior vaginal wall.  Allis clamps were used to mark the anterior vaginal mucosa over a distance of about 6 cm.  The mucosa was injected with 1% lidocaine with epinephrine 1:100,000.  The vaginal mucosa was incised vertically  in the midline.  Sharp and blunt dissection were used to dissect this vaginal tissue off the vaginal mucosa bilaterally.  The dissection was carried back to the level of the pubic rami and down to the top of the Cystocele.  A 1 cm suprapubic incisions were then created 2 cm to the right and left of the midline.  The Foley catheter was removed and a Foley tip with an obturator guide was then placed inside the bladder.  The urethra was deflected and the TVT Exact was performed in a standard fashion from bottom up.  The needle and needle guide were placed first through the right retropubic space and then up through the right suprapubic incision on the ipsilateral side as the urethra was deflected in the opposite direction.  The same procedure was then performed on the patient's left hand side with again the urethra being deflected properly away from the passage of the needle.  At this time, cystoscopy was performed with the findings are as noted above.  The bladder was then drained of all cystoscopic fluid and the Foley was placed inside the bladder and left to gravity drainage throughout the remaining portion of the procedure.  The sling was drawn up through the suprapubic incisions bilaterally. The  plastic sheaths were removed as a Kelly clamp was placed between the urethra and the sling.  Excess sling was trimmed suprapubically.  The sling was noted to be in excellent position.  At this time, the anterior colporrhaphy was performed with a combination of 2-0 and 0 Vicryl suture.  There was some bleeding noted along the left anterior bladder area and this responded to figure-of-eight sutures of 2-0 Vicryl.  There was some oozing from the exit site of the slings bilaterally and the Surgiflo was placed in these regions which created excellent hemostasis.  Excess vaginal mucosa was trimmed and then the anterior vaginal wall was closed with a running locked suture of 2-0 Vicryl.  The suprapubic incisions were closed with Dermabond.  The vagina was hemostatic at this time.  There was good support.  A gauze packing with Estrace cream was placed inside the vagina.  A sterile bandage was placed over the Pfannenstiel incision.  This concluded the patient's procedure.  There were no complications to the procedure.  All needle, instrument, and sponge counts were correct. The patient is escorted to the recovery room in stable and awake condition.   Lenard Galloway, M.D.     BES/MEDQ  D:  11/23/2014  T:  11/24/2014  Job:  [id number]

## 2014-11-24 NOTE — Progress Notes (Addendum)
Vaginal packing removed as ordered at 0530 with small amount dark, red drainage noted on packing. Patient tolerated well.  Foley patent, draining well.

## 2014-11-25 DIAGNOSIS — R441 Visual hallucinations: Secondary | ICD-10-CM | POA: Diagnosis not present

## 2014-11-25 DIAGNOSIS — E785 Hyperlipidemia, unspecified: Secondary | ICD-10-CM | POA: Diagnosis not present

## 2014-11-25 DIAGNOSIS — T402X5A Adverse effect of other opioids, initial encounter: Secondary | ICD-10-CM | POA: Diagnosis not present

## 2014-11-25 DIAGNOSIS — I10 Essential (primary) hypertension: Secondary | ICD-10-CM | POA: Diagnosis not present

## 2014-11-25 DIAGNOSIS — Z9049 Acquired absence of other specified parts of digestive tract: Secondary | ICD-10-CM | POA: Diagnosis not present

## 2014-11-25 DIAGNOSIS — N993 Prolapse of vaginal vault after hysterectomy: Secondary | ICD-10-CM | POA: Diagnosis not present

## 2014-11-25 DIAGNOSIS — N393 Stress incontinence (female) (male): Secondary | ICD-10-CM | POA: Diagnosis not present

## 2014-11-25 DIAGNOSIS — Z23 Encounter for immunization: Secondary | ICD-10-CM | POA: Diagnosis not present

## 2014-11-25 DIAGNOSIS — Z90710 Acquired absence of both cervix and uterus: Secondary | ICD-10-CM | POA: Diagnosis not present

## 2014-11-25 DIAGNOSIS — R350 Frequency of micturition: Secondary | ICD-10-CM | POA: Diagnosis not present

## 2014-11-25 DIAGNOSIS — Z79890 Hormone replacement therapy (postmenopausal): Secondary | ICD-10-CM | POA: Diagnosis not present

## 2014-11-25 DIAGNOSIS — Z79899 Other long term (current) drug therapy: Secondary | ICD-10-CM | POA: Diagnosis not present

## 2014-11-25 DIAGNOSIS — N736 Female pelvic peritoneal adhesions (postinfective): Secondary | ICD-10-CM | POA: Diagnosis not present

## 2014-11-25 LAB — CBC
HCT: 26.8 % — ABNORMAL LOW (ref 36.0–46.0)
Hemoglobin: 8.8 g/dL — ABNORMAL LOW (ref 12.0–15.0)
MCH: 31.1 pg (ref 26.0–34.0)
MCHC: 32.8 g/dL (ref 30.0–36.0)
MCV: 94.7 fL (ref 78.0–100.0)
PLATELETS: 220 10*3/uL (ref 150–400)
RBC: 2.83 MIL/uL — ABNORMAL LOW (ref 3.87–5.11)
RDW: 13.5 % (ref 11.5–15.5)
WBC: 5.6 10*3/uL (ref 4.0–10.5)

## 2014-11-25 MED ORDER — PNEUMOCOCCAL VAC POLYVALENT 25 MCG/0.5ML IJ INJ: 0.5000 mL | INJECTION | INTRAMUSCULAR | Status: DC

## 2014-11-25 MED ORDER — OXYCODONE-ACETAMINOPHEN 5-325 MG PO TABS: 1.0000 | ORAL_TABLET | ORAL | Status: DC | PRN

## 2014-11-25 MED ORDER — FERROUS SULFATE 325 (65 FE) MG PO TBEC: DELAYED_RELEASE_TABLET | ORAL | Status: DC

## 2014-11-25 MED ORDER — DOCUSATE SODIUM 100 MG PO CAPS: 100.0000 mg | ORAL_CAPSULE | Freq: Two times a day (BID) | ORAL | Status: DC

## 2014-11-25 MED ORDER — IBUPROFEN 600 MG PO TABS: 600.0000 mg | ORAL_TABLET | Freq: Four times a day (QID) | ORAL | Status: DC | PRN

## 2014-11-25 NOTE — Care Management Important Message (Deleted)
Important Message  Patient Details  Name: Bridget Strong MRN: [id number] Date of Birth: 11/30/1950   Medicare Important Message Given:       Shelda Altes 11/25/2014, 9:30 AM

## 2014-11-25 NOTE — Discharge Instructions (Signed)
Laparotomy Care After Refer to this sheet in the next few weeks. These instructions provide you with information on caring for yourself after your procedure. Your caregiver may also give you more specific instructions. Your treatment has been planned according to current medical practices, but problems sometimes occur. Call your caregiver if you have any problems or questions after your procedure. HOME CARE INSTRUCTIONS ACTIVITY  Rest as much as possible the first two weeks at home.  Avoid strenuous activity such as heavy lifting (more than 10 pounds), pushing, or pulling. Limit stair climbing to once or twice a day for the first week, then slowly increase this activity.  Take frequent rest periods throughout the day.  Talk with your caregiver about when you may resume your usual physical activity.  You need to be out of bed and walking as much as possible. This decreases the chance of:  Blood clots.  Pneumonia. NUTRITION  You can resume your normal diet once you regain bowel function.  Drink plenty of fluids (6-8 glasses a day or as instructed by your caregiver).  Eat a well-balanced diet.  Daily portions of food from the meat (protein), milk, vegetable, and bread groups are necessary for your health. ELIMINATION It is very important not to strain during bowel movements. If constipation should occur, you may:  Take a mild laxative.  Add fruit and bran to your diet.  Drink more fluids. HYGIENE  Take showers, not baths, until 4-6 weeks after surgery.  If your incision is closed, you may take a shower or tub bath. FEVER If you feel feverish or have shaking chills, take your temperature. If it is 102 F (38.9 C), call your caregiver. The fever may mean there is an infection. PAIN CONTROL  Mild discomfort may occur.  Only take over-the-counter or prescription medicines for pain, discomfort, or fever as directed by your caregiver. Take any prescribed medicines exactly as  directed. INCISION CARE  Keep your incision site clean with soap and water.  Do not use a dressing unless your cut (incision) from surgery is draining or irritated.  If you have small adhesive strips in place and they do not fall off within 10 days, carefully peel them off.  Check your incision and surrounding area daily for any redness, swelling, discoloration, heavy drainage, or separation of the skin. SEXUAL INTERCOURSE Do not have sexual intercourse until after your follow-up appointment, unless your caregiver tells you otherwise. SEEK MEDICAL CARE IF:   You are unable to tolerate food or drinks.  You are unable to pass gas or have a bowel movement.  Your pain becomes more severe or is not relieved with medicines.  You have redness, swelling, discoloration, heavy drainage, or separation of the skin at the incision site. Document Released: 12/13/2003 Document Revised: 04/16/2012 Document Reviewed: 04/29/2007 [facility] Patient Information 2015 [place], [place]. This information is not intended to replace advice given to you by your health care provider. Make sure you discuss any questions you have with your health care provider.  Sacrocolpopexy, Care After Refer to this sheet in the next few weeks. These instructions provide you with information on caring for yourself after your procedure. Your health care provider may also give you more specific instructions. Your treatment has been planned according to current medical practices, but problems sometimes occur. Call your health care provider if you have any problems or questions after your procedure.  WHAT TO EXPECT AFTER THE PROCEDURE  After your procedure, it is typical to have the following:  Pain.  Some vaginal bleeding.  Fatigue. HOME CARE INSTRUCTIONS  Medicine  Only take medicines as directed by your health care provider.  Make sure to finish antibiotic medicine even if you start to feel better. Bandages  Care for your  bandages and cuts (incisions) as directed by your surgeon.  Do not shower until after your bandages have been removed. Activity  Take at least two 10-minute walks each day.  Ask your health care provider when you can return to work and do all your usual activities. It may take 3 months to recover completely.  You will have to restrict many activities when you first return home. For at least 6 weeks:  Do not lift anything heavier than a gallon of milk.  Do not sit on a bike seat.  Do not use a tampon or put anything inside your vagina.  Do not have sexual intercourse.  Do not participate instrenuous activities like running or aerobics. Other Instructions  Do not take a bath or go swimming until your health care provider says you can. Most people can shower after about 6 weeks after the procedure.  Drink enough fluids to keep your urine clear or pale yellow. This helps prevent constipation.  See your health care provider to have your stitches or staples removed if necessary.  Keep all follow-up appointments. SEEK MEDICAL CARE IF:   You have chills or fever.  Your pain medicine is not helping.  You have vaginal bleeding or discharge that will not go away. SEEK IMMEDIATE MEDICAL CARE IF:   You have a fever for more than 2-3 days.  You have very bad pain.  You have heavy vaginal bleeding or discharge.  You have any signs of infection around your cut (incision). Watch for:  Redness.  Tenderness.  Warmth.  Pus or other discharge.  You develop a warm, tender area in your leg.  You have chest pain or trouble breathing. Document Released: 05/05/2013 Document Reviewed: 05/05/2013 [facility] Patient Information 2015 [place], [place]. This information is not intended to replace advice given to you by your health care provider. Make sure you discuss any questions you have with your health care provider.  Urethral Vaginal Sling, Care After Refer to this sheet in the next  few weeks. These instructions provide you with information on caring for yourself after your procedure. Your health care provider may also give you more specific instructions. Your treatment has been planned according to current medical practices, but problems sometimes occur. Call your health care provider if you have any problems or questions after your procedure.  WHAT TO EXPECT AFTER THE PROCEDURE  After your procedure, it is typical to have the following:  A catheter in your bladder until your bladder is able to work on its own properly. You will be instructed on how to empty the catheter bag.  Absorbable stitches in your incisions. They will slowly dissolve over 1-2 months. HOME CARE INSTRUCTIONS  Get plenty of rest.  Only take over-the-counter or prescription medicines as directed by your health care provider. Do not take aspirin because it can cause bleeding.  Do not take baths. Take showers until your health care provider tells you otherwise.  You may resume your usual diet. Eat a well-balanced diet.  Drink enough fluids to keep your urine clear or pale yellow.  Limit exercise and activities as directed by your health care provider. Do not lift anything heavier than 5 pounds (2.3 kg).  Do not douche, use tampons, or have  sexual intercourse for 6 weeks after your procedure.  Follow up with your health care provider as directed. SEEK MEDICAL CARE IF:  You have a heavy or bad smelling vaginal discharge.   You have a rash.   You have pain that is not controlled with medicines.   You have lightheadedness or feel faint.  SEEK IMMEDIATE MEDICAL CARE IF:  You have a fever.   You have vaginal bleeding.   You faint.   You have shortness of breath.   You have chest, abdominal, or leg pain.   You have pain when urinating or cannot urinate.   Your catheter is still in your bladder and becomes blocked.   You have swelling, redness, and pain in the vaginal area.   Document Released: 02/18/2013 Document Reviewed: 02/18/2013 [facility] Patient Information 2015 [place], [place]. This information is not intended to replace advice given to you by your health care provider. Make sure you discuss any questions you have with your health care provider.

## 2014-11-25 NOTE — Care Management Important Message (Signed)
Important Message  Patient Details  Name: SYMPHONIE SCHNEIDERMAN MRN: [id number] Date of Birth: [birth year]   Medicare Important Message Given:       Shelda Altes 11/25/2014, 9:29 AMImportant Message  Patient Details  Name: XZANDRIA CLEVINGER MRN: [id number] Date of Birth: [birth year]   Medicare Important Message Given:       Shelda Altes 11/25/2014, 9:29 AM

## 2014-11-25 NOTE — Progress Notes (Signed)
2 Days Post-Op Procedure(s) (LRB): ABDOMINAL SACROCOLPOPEXY/HALBAN'S CULDOPLASTY (N/A) BILATERAL SALPINGO OOPHORECTOMY (Bilateral) ANTERIOR (CYSTOCELE)  (N/A) EXACT MIDURETERAL SLING (N/A) CYSTOSCOPY (N/A) LYSIS OF ADHESION (N/A)  Subjective: Patient reports + flatus.   Foley out.  No void yet.  Denies dizziness, palpitations or headache. Ambulating without difficulty.   Objective: I have reviewed patient's vital signs, intake and output and pathology. T 97.9, BP 107/46, P69, RR 16. Hgb 8.8  General: alert and cooperative Resp: clear to auscultation bilaterally Cardio: regular rate and rhythm, S1, S2 normal, no murmur, click, rub or gallop GI: soft, non-tender; bowel sounds normal; no masses,  no organomegaly, incision: dry, intact and small amount of dried blood. and Suprapubic ecchymoses.  No induration.  Incisions intact. Vaginal Bleeding: none  Assessment: s/p Procedure(s): ABDOMINAL SACROCOLPOPEXY/HALBAN'S CULDOPLASTY (N/A) BILATERAL SALPINGO OOPHORECTOMY (Bilateral) ANTERIOR (CYSTOCELE)  (N/A) EXACT MIDURETERAL SLING (N/A) CYSTOSCOPY (N/A) LYSIS OF ADHESION (N/A): progressing well  Ready for discharge after voiding trials.  Plan: Encourage ambulation Discharge home after voiding trials. Reviewed instructions and precautions.   Anemia precautions reviewed in detail. Rx - Percocet and Motrin.  Will take iron sulfate 325 mg po bid daily.  Colace and Miralax prn.  Do not start antiHTN for a couple of days.  Follow up in 5 days, sooner as needed.  LOS: 2 days    Arloa Koh 11/25/2014, 7:52 AM

## 2014-11-28 NOTE — Discharge Summary (Signed)
Physician Discharge Summary  Patient ID: Bridget Strong MRN: [id number] DOB/AGE: 1951-05-01 64 y.o.  Admit date: 11/23/2014 Discharge date:  11/25/14 Admission Diagnoses: 1.  Complete vaginal vault prolapse 2.  Occult genuine stress incontinence.  Discharge Diagnoses:   1.  Complete vaginal vault prolapse 2.  Occult genuine stress incontinence. 3.  Status post Abdominal sacral colpopexy, Halban's culdoplasty, bilateral salpingo-oophorectomy, intraabdominal lysis of adhesions TVT Exact mid urethral sling and cystoscopy, anterior colporrhaphy.  Active Problems:   Status post surgery   Discharged Condition: good  Hospital Course:  The patient was admitted on 11/23/14 for an abdominal sacral colpopexy, Halban's culdoplasty, bilateral salpingo-oophorectomy, intraabdominal lysis of adhesions TVT Exact mid urethral sling and cystoscopy, anterior colporrhaphy which were performed without complication while under general anesthesia.  The patient's post op course was uneventful.  She had a morphine PCA and Toradol for pain control initially, and this was converted over to Percocet and Motrin on post op day one when the patient began taking po well.  She ambulated independently and wore PAS and Ted hose for DVT prophylaxis while in bed.  Her foley catheter were removed on post op day two, and she voided good volumes. The patient's vital signs remained stable and she demonstrated no signs of infection during her hospitalization.  The patient's post op day one Hgb was   9.2 and her post op day two Hgb was 8.8.  She was tolerating the this well.  She had very minimal vaginal bleeding, and her incision(s) demonstrated no signs of erythema or significant drainage.  She was found to be in good condition and ready for discharge on post op day two.      Consults: None  Significant Diagnostic Studies: labs:  See hospital course. Final pathology of the tubes and ovaries were benign with a benign  paratubal cyst.  Treatments: surgery:  Abdominal sacral colpopexy, Halban's culdoplasty, bilateral salpingo-oophorectomy, intraabdominal lysis of adhesions TVT Exact mid urethral sling and cystoscopy, anterior colporrhaphy.  Discharge Exam: Blood pressure 107/46, pulse 69, temperature 97.9 F (36.6 C), temperature source Oral, resp. rate 16, height 5\' 4"  (1.626 m), weight 128 lb (58.06 kg), last menstrual period 05/14/1996, SpO2 96 %. General: alert and cooperative Resp: clear to auscultation bilaterally Cardio: regular rate and rhythm, S1, S2 normal, no murmur, click, rub or gallop GI: soft, non-tender; bowel sounds normal; no masses, no organomegaly, incision: dry, intact and small amount of dried blood. and Suprapubic ecchymoses. No induration. Incisions intact. Vaginal Bleeding: none  Disposition: 01-Home or Self Care  Discharge instructions and precautions were reviewed with the patient in verbal and written form.     Medication List    STOP taking these medications        estradiol 0.1 MG/GM vaginal cream  Commonly known as:  ESTRACE     estradiol 0.5 MG tablet  Commonly known as:  ESTRACE      TAKE these medications        Calcium-Vitamin D 600-200 MG-UNIT Caps  Take by mouth daily.     docusate sodium 100 MG capsule  Commonly known as:  COLACE  Take 1 capsule (100 mg total) by mouth 2 (two) times daily. Take prn.     ferrous sulfate 325 (65 FE) MG EC tablet  Take one tablet twice a day with orange juice.     ibuprofen 600 MG tablet  Commonly known as:  ADVIL,MOTRIN  Take 1 tablet (600 mg total) by mouth every 6 (six) hours as needed (  mild pain).     lisinopril-hydrochlorothiazide 20-12.5 MG per tablet  Commonly known as:  PRINZIDE,ZESTORETIC  Take 1 tablet by mouth daily.     oxyCODONE-acetaminophen 5-325 MG per tablet  Commonly known as:  PERCOCET/ROXICET  Take 1-2 tablets by mouth every 4 (four) hours as needed for severe pain (moderate to severe pain  (when tolerating fluids)).     pneumococcal 23 valent vaccine 25 MCG/0.5ML injection  Commonly known as:  PNU-IMMUNE  Inject 0.5 mLs into the muscle tomorrow at 10 am.     pravastatin 40 MG tablet  Commonly known as:  PRAVACHOL  Take 1 tablet (40 mg total) by mouth daily.     vitamin C 500 MG tablet  Commonly known as:  ASCORBIC ACID  Take 500 mg by mouth daily.           Follow-up Information    Follow up with Arloa Koh, MD In 5 days.   Specialty:  Obstetrics and Gynecology   Contact information:   [address] [place] [address] [phone]       Signed: Arloa Koh 11/28/2014, 10:00 PM

## 2014-11-29 ENCOUNTER — Encounter: Payer: Self-pay | Admitting: Obstetrics and Gynecology

## 2014-11-29 ENCOUNTER — Ambulatory Visit (INDEPENDENT_AMBULATORY_CARE_PROVIDER_SITE_OTHER): Payer: Medicare Other | Admitting: Obstetrics and Gynecology

## 2014-11-29 VITALS — BP 114/70 | HR 72 | Resp 16 | Wt 124.9 lb

## 2014-11-29 DIAGNOSIS — D62 Acute posthemorrhagic anemia: Secondary | ICD-10-CM

## 2014-11-29 DIAGNOSIS — B373 Candidiasis of vulva and vagina: Secondary | ICD-10-CM

## 2014-11-29 DIAGNOSIS — B3731 Acute candidiasis of vulva and vagina: Secondary | ICD-10-CM

## 2014-11-29 MED ORDER — FLUCONAZOLE 150 MG PO TABS: 150.0000 mg | ORAL_TABLET | Freq: Every day | ORAL | Status: DC

## 2014-11-29 NOTE — Progress Notes (Signed)
GYNECOLOGY  VISIT   HPI: 64 y.o.   Married  Caucasian  female   [phone] with Patient's last menstrual period was 05/14/1996.   here for   1 week post ABDOMINAL SACROCOLPOPEXY/HALBAN'S CULDOPLASTY (N/A) BILATERAL SALPINGO OOPHORECTOMY (Bilateral) ANTERIOR (CYSTOCELE) (N/A) EXACT MIDURETERAL SLING (N/A) CYSTOSCOPY (N/A) LYSIS OF ADHESION (N/A)  Anemia post op.  Hgb was 8.8 at discharge.  Voided well post op and left hospital with out catheter. Eating well and having bowel movements.  Pain control with only Motrin.  Up and moving at home.   GYNECOLOGIC HISTORY: Patient's last menstrual period was 05/14/1996. Contraception: Hysterectomy Menopausal hormone therapy: none Last mammogram: 05/11/14 D[facility]. No evidence of malignancy.        OB History    Gravida Para Term Preterm AB TAB SAB Ectopic Multiple Living   4 3 3  1  1   3          Patient Active Problem List   Diagnosis Date Noted  . Status post surgery 11/23/2014  . Vaginal vault prolapse after hysterectomy 11/09/2013  . BACTERIAL PNEUMONIA 06/13/2010  . FEVER UNSPECIFIED 06/09/2010  . HYPERLIPIDEMIA 02/09/2009  . HYPERTENSION 02/09/2009    Past Medical History  Diagnosis Date  . Chicken pox   . Hyperlipidemia   . Hypertension   . Anemia     with pregnancy  . Broken humerus 10-24-13  . Complication of anesthesia 1998    Developed bigeminy post op  . PONV (postoperative nausea and vomiting)     Past Surgical History  Procedure Laterality Date  . Cholecystectomy  2004  . Abdominal hysterectomy  1998    TVH--ovaries remain  . Tubal ligation    . Abdominal sacrocolpopexy N/A 11/23/2014    Procedure: ABDOMINAL SACROCOLPOPEXY/HALBAN'S CULDOPLASTY;  Surgeon: Nunzio Cobbs, MD;  Location: [facility];  Service: Gynecology;  Laterality: N/A;  . Salpingoophorectomy Bilateral 11/23/2014    Procedure: BILATERAL SALPINGO OOPHORECTOMY;  Surgeon: Nunzio Cobbs, MD;  Location: [facility];  Service:  Gynecology;  Laterality: Bilateral;  . Anterior and posterior repair N/A 11/23/2014    Procedure: ANTERIOR (CYSTOCELE) ;  Surgeon: Nunzio Cobbs, MD;  Location: [facility];  Service: Gynecology;  Laterality: N/A;  . Bladder suspension N/A 11/23/2014    Procedure: EXACT MIDURETERAL SLING;  Surgeon: Nunzio Cobbs, MD;  Location: [facility];  Service: Gynecology;  Laterality: N/A;  . Cystoscopy N/A 11/23/2014    Procedure: CYSTOSCOPY;  Surgeon: Nunzio Cobbs, MD;  Location: [facility];  Service: Gynecology;  Laterality: N/A;  . Lysis of adhesion N/A 11/23/2014    Procedure: LYSIS OF ADHESION;  Surgeon: Nunzio Cobbs, MD;  Location: [facility];  Service: Gynecology;  Laterality: N/A;    Current Outpatient Prescriptions  Medication Sig Dispense Refill  . Calcium Carbonate-Vitamin D (CALCIUM-VITAMIN D) 600-200 MG-UNIT CAPS Take by mouth daily.      Marland Kitchen docusate sodium (COLACE) 100 MG capsule Take 1 capsule (100 mg total) by mouth 2 (two) times daily. Take prn. 60 capsule 1  . ferrous sulfate 325 (65 FE) MG EC tablet Take one tablet twice a day with orange juice. 60 tablet 1  . ibuprofen (ADVIL,MOTRIN) 600 MG tablet Take 1 tablet (600 mg total) by mouth every 6 (six) hours as needed (mild pain). 30 tablet 0  . lisinopril-hydrochlorothiazide (PRINZIDE,ZESTORETIC) 20-12.5 MG per tablet Take 1 tablet by mouth daily. 90 tablet 3  . oxyCODONE-acetaminophen (PERCOCET/ROXICET)  5-325 MG per tablet Take 1-2 tablets by mouth every 4 (four) hours as needed for severe pain (moderate to severe pain (when tolerating fluids)). 30 tablet 0  . pneumococcal 23 valent vaccine (PNU-IMMUNE) 25 MCG/0.5ML injection Inject 0.5 mLs into the muscle tomorrow at 10 am. 2.5 mL 0  . pravastatin (PRAVACHOL) 40 MG tablet Take 1 tablet (40 mg total) by mouth daily. 90 tablet 3  . vitamin C (ASCORBIC ACID) 500 MG tablet Take 500 mg by mouth daily.       No current facility-administered medications for this  visit.     ALLERGIES: Hydrocodone and Tramadol  Family History  Problem Relation Age of Onset  . Alcohol abuse Other   . Arthritis Other   . Hypertension Other   . Stroke Other   . Stroke Father   . Alcohol abuse Father   . Breast cancer Maternal Grandmother 60  . Breast cancer Paternal Grandmother 75    History   Social History  . Marital Status: Married    Spouse Name: N/A  . Number of Children: N/A  . Years of Education: N/A   Occupational History  . Not on file.   Social History Main Topics  . Smoking status: Never Smoker   . Smokeless tobacco: Not on file  . Alcohol Use: No  . Drug Use: No  . Sexual Activity:    Partners: Male    Birth Control/ Protection: Surgical     Comment: TVH-ovaries remain   Other Topics Concern  . Not on file   Social History Narrative    ROS:  Pertinent items are noted in HPI.  PHYSICAL EXAMINATION:    LMP 05/14/1996    General appearance: alert, cooperative and appears stated age Abdomen: pfannenstiel incision intact, soft, non-tender; bowel sounds normal; no masses,  no organomegaly   Pelvic: External genitalia:   Suprapubic incisions intact and ecchymoses of the bilateral labia, extending down to the vulva.  No induration or drainage.              Urethra:  normal appearing urethra with no masses, tenderness or lesions              Bartholins and Skenes: normal                 Vagina:  Large amount of curd like white drainage in the vagina. No bleeding.  No foreign body visible or palpable.               Cervix: absent            Bimanual Exam:  Uterus:  uterus absent              Adnexa: no mass, fullness, tenderness           Chaperone was present for exam.  ASSESSMENT  Doing well overall post op. Yeast vaginitis.   Post op anemia.   PLAN  Counseled regarding activity level post surgery.  Will treat with Diflucan 150 mg po and then repeat in 48 hours.  Check Hgb now - 9.6.  Continue Fe po bid.  Follow up  in 2 weeks for next visit.   An After Visit Summary was printed and given to the patient.  __15____ minutes face to face time of which over 50% was spent in counseling.

## 2014-11-30 ENCOUNTER — Telehealth: Payer: Self-pay | Admitting: Obstetrics and Gynecology

## 2014-11-30 DIAGNOSIS — D62 Acute posthemorrhagic anemia: Secondary | ICD-10-CM | POA: Diagnosis not present

## 2014-11-30 LAB — HEMOGLOBIN, FINGERSTICK: HEMOGLOBIN, FINGERSTICK: 9.6 g/dL — AB (ref 12.0–16.0)

## 2014-11-30 NOTE — Telephone Encounter (Signed)
Spoke with patient. Patient states that she took her first Diflucan around 3 pm. Woke up at 4:30 pm - 5pm and was covered in hives from her waist up. Then face began to turn red and swell. "I don't even look  Like myself. If my face swelled any more I could not open my eyes." Started on Benadryl last night with no relief. Called on call doctor Dr. Toney Rakes who placed her on a Prednisone taper and Hydroxyzine. Started first dose this morning. States hives on her body have gone down and she is no longer itching but her face is still red and swollen. Denies any difficulty swallowing or breathing. Would like to know if Dr.Silva has any further recommendations and if she needs to switch treatment for yeast infection or will be okay just taking one pill. Advised often one pill will clear symptoms and NOT to take another pill at this time. Advised I will speak with Dr.Silva and covering provider regarding further recommendations. Patient is agreeable.  Cc: Dr.Miller

## 2014-11-30 NOTE — Telephone Encounter (Signed)
Spoke with patient. Advised of message as seen below from [place]. Patient states she is unavailable to be seen for an appointment tomorrow as her husband will be at work and she is unable to drive post operatively. OV scheduled for Thursday 7/21 at 1pm with Dr.Silva. Patient is agreeable to date and time. Will continue Prednisone and will call if there are any change in her symptoms.  Routing to provider for final review. Patient agreeable to disposition. Will close encounter.   Patient aware provider will review message and nurse will return call if any additional advice or change of disposition.

## 2014-11-30 NOTE — Telephone Encounter (Signed)
No further treatment for yeast vaginitis!  We have added the Diflucan to her allergy list.  I will see her back for a recheck appointment tomorrow. Have her continue with the prednisone taper.  This will treat the allergic reaction.

## 2014-11-30 NOTE — Telephone Encounter (Signed)
Patient was seen yesterday for post op and was given a new medication. She had a bad reaction(HIVES) to it and had to call the after hours doctor. She wants to talk with Dr. Quincy Simmonds.

## 2014-12-02 ENCOUNTER — Encounter: Payer: Self-pay | Admitting: Obstetrics and Gynecology

## 2014-12-02 ENCOUNTER — Ambulatory Visit (INDEPENDENT_AMBULATORY_CARE_PROVIDER_SITE_OTHER): Payer: Medicare Other | Admitting: Obstetrics and Gynecology

## 2014-12-02 VITALS — BP 118/72 | HR 80 | Resp 16 | Wt 126.8 lb

## 2014-12-02 DIAGNOSIS — T7840XA Allergy, unspecified, initial encounter: Secondary | ICD-10-CM

## 2014-12-02 NOTE — Progress Notes (Signed)
GYNECOLOGY  VISIT   HPI: 64 y.o.   Married  Caucasian  female   [phone] with Patient's last menstrual period was 05/14/1996.   here for  Follow up on allergic reaction to diflucan.  Had anaphylaxis to Diflucan for yeast vaginitis. Took one pill. Had welts, nodule of hand and no tongue swelling or shortness of breath.  Had facial swelling.  Took benadryl which did not help. Called the on call MD and received an Rx for Prednisone taper. Is on her third day.  Notes some difficulty sleeping but feeling OK.  GYNECOLOGIC HISTORY: Patient's last menstrual period was 05/14/1996. Contraception: none Menopausal hormone therapy: none Last mammogram: 05/11/14 dense C no evidence of malignancy.        OB History    Gravida Para Term Preterm AB TAB SAB Ectopic Multiple Living   4 3 3  1  1   3          Patient Active Problem List   Diagnosis Date Noted  . Status post surgery 11/23/2014  . Vaginal vault prolapse after hysterectomy 11/09/2013  . BACTERIAL PNEUMONIA 06/13/2010  . FEVER UNSPECIFIED 06/09/2010  . HYPERLIPIDEMIA 02/09/2009  . HYPERTENSION 02/09/2009    Past Medical History  Diagnosis Date  . Chicken pox   . Hyperlipidemia   . Hypertension   . Anemia     with pregnancy  . Broken humerus 10-24-13  . Complication of anesthesia 1998    Developed bigeminy post op  . PONV (postoperative nausea and vomiting)     Past Surgical History  Procedure Laterality Date  . Cholecystectomy  2004  . Abdominal hysterectomy  1998    TVH--ovaries remain  . Tubal ligation    . Abdominal sacrocolpopexy N/A 11/23/2014    Procedure: ABDOMINAL SACROCOLPOPEXY/HALBAN'S CULDOPLASTY;  Surgeon: Nunzio Cobbs, MD;  Location: [facility];  Service: Gynecology;  Laterality: N/A;  . Salpingoophorectomy Bilateral 11/23/2014    Procedure: BILATERAL SALPINGO OOPHORECTOMY;  Surgeon: Nunzio Cobbs, MD;  Location: [facility];  Service: Gynecology;  Laterality: Bilateral;  . Anterior and  posterior repair N/A 11/23/2014    Procedure: ANTERIOR (CYSTOCELE) ;  Surgeon: Nunzio Cobbs, MD;  Location: [facility];  Service: Gynecology;  Laterality: N/A;  . Bladder suspension N/A 11/23/2014    Procedure: EXACT MIDURETERAL SLING;  Surgeon: Nunzio Cobbs, MD;  Location: [facility];  Service: Gynecology;  Laterality: N/A;  . Cystoscopy N/A 11/23/2014    Procedure: CYSTOSCOPY;  Surgeon: Nunzio Cobbs, MD;  Location: [facility];  Service: Gynecology;  Laterality: N/A;  . Lysis of adhesion N/A 11/23/2014    Procedure: LYSIS OF ADHESION;  Surgeon: Nunzio Cobbs, MD;  Location: [facility];  Service: Gynecology;  Laterality: N/A;    Current Outpatient Prescriptions  Medication Sig Dispense Refill  . Calcium Carbonate-Vitamin D (CALCIUM-VITAMIN D) 600-200 MG-UNIT CAPS Take by mouth daily.      Marland Kitchen docusate sodium (COLACE) 100 MG capsule Take 1 capsule (100 mg total) by mouth 2 (two) times daily. Take prn. 60 capsule 1  . ferrous sulfate 325 (65 FE) MG EC tablet Take one tablet twice a day with orange juice. 60 tablet 1  . hydrOXYzine (VISTARIL) 25 MG capsule Take 25 mg by mouth daily as needed.    Marland Kitchen ibuprofen (ADVIL,MOTRIN) 600 MG tablet Take 1 tablet (600 mg total) by mouth every 6 (six) hours as needed (mild pain). 30 tablet 0  .  lisinopril-hydrochlorothiazide (PRINZIDE,ZESTORETIC) 20-12.5 MG per tablet Take 1 tablet by mouth daily. 90 tablet 3  . oxyCODONE-acetaminophen (PERCOCET/ROXICET) 5-325 MG per tablet Take 1-2 tablets by mouth every 4 (four) hours as needed for severe pain (moderate to severe pain (when tolerating fluids)). 30 tablet 0  . pneumococcal 23 valent vaccine (PNU-IMMUNE) 25 MCG/0.5ML injection Inject 0.5 mLs into the muscle tomorrow at 10 am. 2.5 mL 0  . pravastatin (PRAVACHOL) 40 MG tablet Take 1 tablet (40 mg total) by mouth daily. 90 tablet 3  . predniSONE (DELTASONE) 10 MG tablet Take 10 mg by mouth daily. pred taper    . vitamin C (ASCORBIC ACID)  500 MG tablet Take 500 mg by mouth daily.       No current facility-administered medications for this visit.     ALLERGIES: Diflucan; Hydrocodone; and Tramadol  Family History  Problem Relation Age of Onset  . Alcohol abuse Other   . Arthritis Other   . Hypertension Other   . Stroke Other   . Stroke Father   . Alcohol abuse Father   . Breast cancer Maternal Grandmother 67  . Breast cancer Paternal Grandmother 57    History   Social History  . Marital Status: Married    Spouse Name: N/A  . Number of Children: N/A  . Years of Education: N/A   Occupational History  . Not on file.   Social History Main Topics  . Smoking status: Never Smoker   . Smokeless tobacco: Not on file  . Alcohol Use: No  . Drug Use: No  . Sexual Activity:    Partners: Male    Birth Control/ Protection: Surgical     Comment: TVH-ovaries remain   Other Topics Concern  . Not on file   Social History Narrative    ROS:  Pertinent items are noted in HPI.  PHYSICAL EXAMINATION:    BP 118/72 mmHg  Pulse 80  Resp 16  Wt 126 lb 12.8 oz (57.516 kg)  LMP 05/14/1996    General appearance: alert, cooperative and appears stated age Head: Normocephalic.  Erelids with mild erythema and edema.    Skin: slightly darkening of the skin on left arm and splotchy skin change on chest anteriorly and posteriorly.    ASSESSMENT  Allergic reaction to Diflucan.  Improved with Prednisone taper.  PLAN  Counseled regarding reaction to Diflucan.  I do not recommend OTC treatments with antifungal treatments due to possible cross reactivity.  Please be seen for evaluations for possible yeast infections.   Eat yogurt with active cultures now to fight the yeast naturally.  Follow up in one week for a recheck of incision.  An After Visit Summary was printed and given to the patient.  ___10___ minutes face to face time of which over 50% was spent in counseling.

## 2014-12-13 ENCOUNTER — Ambulatory Visit (INDEPENDENT_AMBULATORY_CARE_PROVIDER_SITE_OTHER): Payer: Medicare Other | Admitting: Obstetrics and Gynecology

## 2014-12-13 ENCOUNTER — Encounter: Payer: Self-pay | Admitting: Obstetrics and Gynecology

## 2014-12-13 VITALS — BP 92/58 | HR 88 | Ht 64.0 in | Wt 125.0 lb

## 2014-12-13 DIAGNOSIS — R309 Painful micturition, unspecified: Secondary | ICD-10-CM | POA: Diagnosis not present

## 2014-12-13 LAB — POCT URINALYSIS DIPSTICK
PH UA: 5
Urobilinogen, UA: NEGATIVE

## 2014-12-13 NOTE — Progress Notes (Signed)
GYNECOLOGY  VISIT   HPI: 64 y.o.   Married  Caucasian  female   [phone] with Patient's last menstrual period was 05/14/1996.   here for 2 week post op  Urinary pressure with voiding at night.  Symptoms started the week after surgery. Some suprapubic discomfort at incision sites.  No fever.  No back pain, nausea or vomiting.  No vaginal bleeding.  Some tan drainage has resolved.  No vaginal itching or burning.   Urine dip - WBC 2+, trace RBCs.  GYNECOLOGIC HISTORY: Patient's last menstrual period was 05/14/1996. Contraception: TVH  Menopausal hormone therapy: none Last mammogram: 05/11/14 bi-rads 1: negative  Last pap smear: 04/23/12 wnl        OB History    Gravida Para Term Preterm AB TAB SAB Ectopic Multiple Living   4 3 3  1  1   3          Patient Active Problem List   Diagnosis Date Noted  . Status post surgery 11/23/2014  . Vaginal vault prolapse after hysterectomy 11/09/2013  . BACTERIAL PNEUMONIA 06/13/2010  . FEVER UNSPECIFIED 06/09/2010  . HYPERLIPIDEMIA 02/09/2009  . HYPERTENSION 02/09/2009    Past Medical History  Diagnosis Date  . Chicken pox   . Hyperlipidemia   . Hypertension   . Anemia     with pregnancy  . Broken humerus 10-24-13  . Complication of anesthesia 1998    Developed bigeminy post op  . PONV (postoperative nausea and vomiting)     Past Surgical History  Procedure Laterality Date  . Cholecystectomy  2004  . Abdominal hysterectomy  1998    TVH--ovaries remain  . Tubal ligation    . Abdominal sacrocolpopexy N/A 11/23/2014    Procedure: ABDOMINAL SACROCOLPOPEXY/HALBAN'S CULDOPLASTY;  Surgeon: Nunzio Cobbs, MD;  Location: [facility];  Service: Gynecology;  Laterality: N/A;  . Salpingoophorectomy Bilateral 11/23/2014    Procedure: BILATERAL SALPINGO OOPHORECTOMY;  Surgeon: Nunzio Cobbs, MD;  Location: [facility];  Service: Gynecology;  Laterality: Bilateral;  . Anterior and posterior repair N/A 11/23/2014    Procedure:  ANTERIOR (CYSTOCELE) ;  Surgeon: Nunzio Cobbs, MD;  Location: [facility];  Service: Gynecology;  Laterality: N/A;  . Bladder suspension N/A 11/23/2014    Procedure: EXACT MIDURETERAL SLING;  Surgeon: Nunzio Cobbs, MD;  Location: [facility];  Service: Gynecology;  Laterality: N/A;  . Cystoscopy N/A 11/23/2014    Procedure: CYSTOSCOPY;  Surgeon: Nunzio Cobbs, MD;  Location: [facility];  Service: Gynecology;  Laterality: N/A;  . Lysis of adhesion N/A 11/23/2014    Procedure: LYSIS OF ADHESION;  Surgeon: Nunzio Cobbs, MD;  Location: [facility];  Service: Gynecology;  Laterality: N/A;    Current Outpatient Prescriptions  Medication Sig Dispense Refill  . Calcium Carbonate-Vitamin D (CALCIUM-VITAMIN D) 600-200 MG-UNIT CAPS Take by mouth daily.      Marland Kitchen docusate sodium (COLACE) 100 MG capsule Take 1 capsule (100 mg total) by mouth 2 (two) times daily. Take prn. 60 capsule 1  . ferrous sulfate 325 (65 FE) MG EC tablet Take one tablet twice a day with orange juice. 60 tablet 1  . hydrOXYzine (VISTARIL) 25 MG capsule Take 25 mg by mouth daily as needed.    Marland Kitchen ibuprofen (ADVIL,MOTRIN) 600 MG tablet Take 1 tablet (600 mg total) by mouth every 6 (six) hours as needed (mild pain). 30 tablet 0  . lisinopril-hydrochlorothiazide (PRINZIDE,ZESTORETIC) 20-12.5 MG per  tablet Take 1 tablet by mouth daily. 90 tablet 3  . oxyCODONE-acetaminophen (PERCOCET/ROXICET) 5-325 MG per tablet Take 1-2 tablets by mouth every 4 (four) hours as needed for severe pain (moderate to severe pain (when tolerating fluids)). 30 tablet 0  . pneumococcal 23 valent vaccine (PNU-IMMUNE) 25 MCG/0.5ML injection Inject 0.5 mLs into the muscle tomorrow at 10 am. 2.5 mL 0  . pravastatin (PRAVACHOL) 40 MG tablet Take 1 tablet (40 mg total) by mouth daily. 90 tablet 3  . predniSONE (DELTASONE) 10 MG tablet Take 10 mg by mouth daily. pred taper    . vitamin C (ASCORBIC ACID) 500 MG tablet Take 500 mg by mouth daily.        No current facility-administered medications for this visit.     ALLERGIES: Diflucan; Hydrocodone; and Tramadol  Family History  Problem Relation Age of Onset  . Alcohol abuse Other   . Arthritis Other   . Hypertension Other   . Stroke Other   . Stroke Father   . Alcohol abuse Father   . Breast cancer Maternal Grandmother 70  . Breast cancer Paternal Grandmother 68    History   Social History  . Marital Status: Married    Spouse Name: N/A  . Number of Children: N/A  . Years of Education: N/A   Occupational History  . Not on file.   Social History Main Topics  . Smoking status: Never Smoker   . Smokeless tobacco: Not on file  . Alcohol Use: No  . Drug Use: No  . Sexual Activity:    Partners: Male    Birth Control/ Protection: Surgical     Comment: TVH-ovaries remain   Other Topics Concern  . Not on file   Social History Narrative    ROS:  Pertinent items are noted in HPI.  PHYSICAL EXAMINATION:    LMP 05/14/1996    General appearance: alert, cooperative and appears stated age Abdomen: Pfannenstiel incision intact and suprapubic incisions intact, soft, non-tender; bowel sounds normal; no masses,  no organomegaly Extremities: extremities normal, atraumatic, no cyanosis or edema   Pelvic: External genitalia:  no lesions              Urethra:  normal appearing urethra with no masses, tenderness or lesions              Bartholins and Skenes: normal                 Vagina: normal appearing vagina with normal color and discharge, no lesions.  Whitish creamy discharge.  Suture line intact anteriorly.              Cervix:  Absent.  Bimanual Exam:  Uterus:  uterus absent              Adnexa: no mass, fullness, tenderness          Chaperone was present for exam.  ASSESSMENT  Doing well post op.  Bladder pressure.   WBCs in urine.  Looks like vaginal contamination.   PLAN  Counseled regarding surgical recovery process.  Will check UC. No Abx at this  time. Follow up in 3 weeks for 6 week post op visit.   An After Visit Summary was printed and given to the patient.  __15____ minutes face to face time of which over 50% was spent in counseling.

## 2014-12-14 LAB — URINE CULTURE
COLONY COUNT: NO GROWTH
ORGANISM ID, BACTERIA: NO GROWTH

## 2015-01-05 ENCOUNTER — Encounter: Payer: Self-pay | Admitting: Obstetrics and Gynecology

## 2015-01-05 ENCOUNTER — Ambulatory Visit (INDEPENDENT_AMBULATORY_CARE_PROVIDER_SITE_OTHER): Payer: Medicare Other | Admitting: Obstetrics and Gynecology

## 2015-01-05 VITALS — BP 106/70 | HR 78 | Resp 18 | Ht 64.0 in | Wt 129.4 lb

## 2015-01-05 DIAGNOSIS — Z9889 Other specified postprocedural states: Secondary | ICD-10-CM

## 2015-01-05 DIAGNOSIS — D649 Anemia, unspecified: Secondary | ICD-10-CM

## 2015-01-05 NOTE — Progress Notes (Signed)
GYNECOLOGY  VISIT   HPI: 64 y.o.   Married  Caucasian  female   [phone] with Patient's last menstrual period was 05/14/1996.   here for 6 week post op.  Status post abdominal sacral colpopexy, Halban's culdoplasty, bilateral salpingo-oophorectomy, intraabdominal lysis of adhesions TVT Exact mid urethral sling and cystoscopy, anterior colporrhaphy on 11/23/14.  States, "There is nothing wrong.  Not a thing." Has good bladder and bowel function.   Patient had anemia post op.  Is still on iron.   Has vaginal estrogen cream at home and is not currently using this.   GYNECOLOGIC HISTORY: Patient's last menstrual period was 05/14/1996. Contraception: TVH Menopausal hormone therapy: none Last mammogram: 05/11/2014 breast density category ; normal  Last pap smear: 04/23/2012 wnl        OB History    Gravida Para Term Preterm AB TAB SAB Ectopic Multiple Living   4 3 3  1  1   3          Patient Active Problem List   Diagnosis Date Noted  . Status post surgery 11/23/2014  . Vaginal vault prolapse after hysterectomy 11/09/2013  . BACTERIAL PNEUMONIA 06/13/2010  . FEVER UNSPECIFIED 06/09/2010  . HYPERLIPIDEMIA 02/09/2009  . HYPERTENSION 02/09/2009    Past Medical History  Diagnosis Date  . Chicken pox   . Hyperlipidemia   . Hypertension   . Anemia     with pregnancy  . Broken humerus 10-24-13  . Complication of anesthesia 1998    Developed bigeminy post op  . PONV (postoperative nausea and vomiting)     Past Surgical History  Procedure Laterality Date  . Cholecystectomy  2004  . Abdominal hysterectomy  1998    TVH--ovaries remain  . Tubal ligation    . Abdominal sacrocolpopexy N/A 11/23/2014    Procedure: ABDOMINAL SACROCOLPOPEXY/HALBAN'S CULDOPLASTY;  Surgeon: Nunzio Cobbs, MD;  Location: [facility];  Service: Gynecology;  Laterality: N/A;  . Salpingoophorectomy Bilateral 11/23/2014    Procedure: BILATERAL SALPINGO OOPHORECTOMY;  Surgeon: Nunzio Cobbs, MD;  Location: [facility];  Service: Gynecology;  Laterality: Bilateral;  . Anterior and posterior repair N/A 11/23/2014    Procedure: ANTERIOR (CYSTOCELE) ;  Surgeon: Nunzio Cobbs, MD;  Location: [facility];  Service: Gynecology;  Laterality: N/A;  . Bladder suspension N/A 11/23/2014    Procedure: EXACT MIDURETERAL SLING;  Surgeon: Nunzio Cobbs, MD;  Location: [facility];  Service: Gynecology;  Laterality: N/A;  . Cystoscopy N/A 11/23/2014    Procedure: CYSTOSCOPY;  Surgeon: Nunzio Cobbs, MD;  Location: [facility];  Service: Gynecology;  Laterality: N/A;  . Lysis of adhesion N/A 11/23/2014    Procedure: LYSIS OF ADHESION;  Surgeon: Nunzio Cobbs, MD;  Location: [facility];  Service: Gynecology;  Laterality: N/A;    Current Outpatient Prescriptions  Medication Sig Dispense Refill  . Calcium Carbonate-Vitamin D (CALCIUM-VITAMIN D) 600-200 MG-UNIT CAPS Take by mouth daily.      Marland Kitchen docusate sodium (COLACE) 100 MG capsule Take 1 capsule (100 mg total) by mouth 2 (two) times daily. Take prn. 60 capsule 1  . ferrous sulfate 325 (65 FE) MG EC tablet Take one tablet twice a day with orange juice. 60 tablet 1  . hydrOXYzine (VISTARIL) 25 MG capsule Take 25 mg by mouth daily as needed.    Marland Kitchen ibuprofen (ADVIL,MOTRIN) 600 MG tablet Take 1 tablet (600 mg total) by mouth every 6 (six) hours  as needed (mild pain). 30 tablet 0  . lisinopril-hydrochlorothiazide (PRINZIDE,ZESTORETIC) 20-12.5 MG per tablet Take 1 tablet by mouth daily. 90 tablet 3  . oxyCODONE-acetaminophen (PERCOCET/ROXICET) 5-325 MG per tablet Take 1-2 tablets by mouth every 4 (four) hours as needed for severe pain (moderate to severe pain (when tolerating fluids)). 30 tablet 0  . pneumococcal 23 valent vaccine (PNU-IMMUNE) 25 MCG/0.5ML injection Inject 0.5 mLs into the muscle tomorrow at 10 am. 2.5 mL 0  . pravastatin (PRAVACHOL) 40 MG tablet Take 1 tablet (40 mg total) by mouth daily. 90 tablet 3  . predniSONE  (DELTASONE) 10 MG tablet Take 10 mg by mouth daily. pred taper    . vitamin C (ASCORBIC ACID) 500 MG tablet Take 500 mg by mouth daily.       No current facility-administered medications for this visit.     ALLERGIES: Diflucan; Hydrocodone; and Tramadol  Family History  Problem Relation Age of Onset  . Alcohol abuse Other   . Arthritis Other   . Hypertension Other   . Stroke Other   . Stroke Father   . Alcohol abuse Father   . Breast cancer Maternal Grandmother 55  . Breast cancer Paternal Grandmother 67    Social History   Social History  . Marital Status: Married    Spouse Name: N/A  . Number of Children: N/A  . Years of Education: N/A   Occupational History  . Not on file.   Social History Main Topics  . Smoking status: Never Smoker   . Smokeless tobacco: Not on file  . Alcohol Use: No  . Drug Use: No  . Sexual Activity:    Partners: Male    Birth Control/ Protection: Surgical     Comment: TVH-ovaries remain   Other Topics Concern  . Not on file   Social History Narrative    ROS:  Pertinent items are noted in HPI.  PHYSICAL EXAMINATION:    LMP 05/14/1996    General appearance: alert, cooperative and appears stated age   Abdomen: Pfannenstiel incision intact, soft, non-tender; bowel sounds normal; no masses,  no organomegaly   Pelvic: External genitalia:  no lesions              Urethra:  normal appearing urethra with no masses, tenderness or lesions              Bartholins and Skenes: normal                 Vagina: normal appearing vagina with normal color and discharge, no lesions.  Anterior vaginal suture line almost gone.  Healing well. Excellent anterior vaginal wall support.              Cervix: absent           Bimanual Exam:  Uterus:  uterus absent              Adnexa: normal adnexa and no mass, fullness, tenderness              Rectovaginal: Yes.  .  Confirms.              Anus:  normal sphincter tone, no lesions  Chaperone was present for  exam.  ASSESSMENT  Status post abdominal sacral colpopexy, Halban's culdoplasty, bilateral salpingo-oophorectomy, intraabdominal lysis of adhesions TVT Exact mid urethral sling and cystoscopy, anterior colporrhaphy on 11/23/14. Doing well overall.   Post op anemia.  PLAN  Counseled regarding continued decreased activity for 6 more weeks.  Will check hemoglobin now - 11.6 Continue on iron therapy - iron sulfate once daily for 4 weeks and then stop. Start using vaginal estrogen 1/2 gram pv at hs twice weekly.  Has Rx at home.  Follow up for final post op check in 6 weeks.   An After Visit Summary was printed and given to the patient.

## 2015-01-06 ENCOUNTER — Telehealth: Payer: Self-pay

## 2015-01-06 DIAGNOSIS — D649 Anemia, unspecified: Secondary | ICD-10-CM | POA: Diagnosis not present

## 2015-01-06 LAB — HEMOGLOBIN, FINGERSTICK: Hemoglobin, fingerstick: 11.6 g/dL — ABNORMAL LOW (ref 12.0–16.0)

## 2015-01-06 NOTE — Telephone Encounter (Signed)
Called and notified patient Dr. Quincy Simmonds had reviewed her Hgb of 11.6 from yesterday and would like her to continue with her iron daily for four more weeks and then she can stop.(our computers were down yesterday afternoon when we did her fingerstick Hgb.) Patient voiced she understood.

## 2015-02-08 ENCOUNTER — Telehealth: Payer: Self-pay | Admitting: Obstetrics and Gynecology

## 2015-02-08 NOTE — Telephone Encounter (Signed)
Left patient a message to call back to reschedule a future appointment for a six week recheck that was cancelled by the provider.

## 2015-02-09 ENCOUNTER — Other Ambulatory Visit (INDEPENDENT_AMBULATORY_CARE_PROVIDER_SITE_OTHER): Payer: Medicare Other

## 2015-02-09 DIAGNOSIS — Z0000 Encounter for general adult medical examination without abnormal findings: Secondary | ICD-10-CM | POA: Diagnosis not present

## 2015-02-09 DIAGNOSIS — E785 Hyperlipidemia, unspecified: Secondary | ICD-10-CM

## 2015-02-09 LAB — CBC WITH DIFFERENTIAL/PLATELET
BASOS ABS: 0 10*3/uL (ref 0.0–0.1)
BASOS PCT: 0.3 % (ref 0.0–3.0)
Eosinophils Absolute: 0.1 10*3/uL (ref 0.0–0.7)
Eosinophils Relative: 1.6 % (ref 0.0–5.0)
HCT: 38.6 % (ref 36.0–46.0)
Hemoglobin: 12.9 g/dL (ref 12.0–15.0)
Lymphocytes Relative: 49.1 % — ABNORMAL HIGH (ref 12.0–46.0)
Lymphs Abs: 2.8 10*3/uL (ref 0.7–4.0)
MCHC: 33.4 g/dL (ref 30.0–36.0)
MCV: 92.8 fl (ref 78.0–100.0)
MONO ABS: 0.4 10*3/uL (ref 0.1–1.0)
Monocytes Relative: 6.5 % (ref 3.0–12.0)
Neutro Abs: 2.4 10*3/uL (ref 1.4–7.7)
Neutrophils Relative %: 42.5 % — ABNORMAL LOW (ref 43.0–77.0)
PLATELETS: 266 10*3/uL (ref 150.0–400.0)
RBC: 4.16 Mil/uL (ref 3.87–5.11)
RDW: 13.2 % (ref 11.5–15.5)
WBC: 5.6 10*3/uL (ref 4.0–10.5)

## 2015-02-09 LAB — LDL CHOLESTEROL, DIRECT: Direct LDL: 129 mg/dL

## 2015-02-09 LAB — BASIC METABOLIC PANEL
BUN: 15 mg/dL (ref 6–23)
CHLORIDE: 103 meq/L (ref 96–112)
CO2: 31 mEq/L (ref 19–32)
CREATININE: 0.74 mg/dL (ref 0.40–1.20)
Calcium: 9.5 mg/dL (ref 8.4–10.5)
GFR: 83.51 mL/min (ref >60.00)
Glucose, Bld: 96 mg/dL (ref 70–99)
Potassium: 4.2 mEq/L (ref 3.5–5.1)
Sodium: 140 mEq/L (ref 135–145)

## 2015-02-09 LAB — HEPATIC FUNCTION PANEL
ALK PHOS: 94 U/L (ref 39–117)
ALT: 28 U/L (ref 0–35)
AST: 14 U/L (ref 0–37)
Albumin: 4.1 g/dL (ref 3.5–5.2)
BILIRUBIN TOTAL: 0.3 mg/dL (ref 0.2–1.2)
Bilirubin, Direct: 0.1 mg/dL (ref 0.0–0.3)
Total Protein: 7.2 g/dL (ref 6.0–8.3)

## 2015-02-09 LAB — LIPID PANEL
CHOL/HDL RATIO: 4
Cholesterol: 207 mg/dL — ABNORMAL HIGH (ref 0–200)
HDL: 46.6 mg/dL (ref >39.00)
NONHDL: 159.91
TRIGLYCERIDES: 268 mg/dL — AB (ref 0.0–149.0)
VLDL: 53.6 mg/dL — ABNORMAL HIGH (ref 0.0–40.0)

## 2015-02-09 LAB — TSH: TSH: 3.6 u[IU]/mL (ref 0.35–4.50)

## 2015-02-14 ENCOUNTER — Encounter: Payer: Self-pay | Admitting: Family Medicine

## 2015-02-14 ENCOUNTER — Ambulatory Visit (INDEPENDENT_AMBULATORY_CARE_PROVIDER_SITE_OTHER): Payer: Medicare Other | Admitting: Family Medicine

## 2015-02-14 VITALS — BP 110/74 | HR 90 | Temp 98.0°F | Ht 64.76 in | Wt 128.3 lb

## 2015-02-14 DIAGNOSIS — Z0000 Encounter for general adult medical examination without abnormal findings: Secondary | ICD-10-CM | POA: Diagnosis not present

## 2015-02-14 DIAGNOSIS — Z780 Asymptomatic menopausal state: Secondary | ICD-10-CM

## 2015-02-14 DIAGNOSIS — Z23 Encounter for immunization: Secondary | ICD-10-CM | POA: Diagnosis not present

## 2015-02-14 MED ORDER — ESTRADIOL 0.5 MG PO TABS: 0.5000 mg | ORAL_TABLET | Freq: Every day | ORAL | Status: DC

## 2015-02-14 MED ORDER — PRAVASTATIN SODIUM 40 MG PO TABS: 40.0000 mg | ORAL_TABLET | Freq: Every day | ORAL | Status: DC

## 2015-02-14 MED ORDER — LISINOPRIL-HYDROCHLOROTHIAZIDE 20-12.5 MG PO TABS: 1.0000 | ORAL_TABLET | Freq: Every day | ORAL | Status: DC

## 2015-02-14 NOTE — Patient Instructions (Signed)
Continue with regular weight bearing exercise Daily calcium 1200 mg and Vit D [phone] IU

## 2015-02-14 NOTE — Progress Notes (Signed)
Subjective:    Patient ID: Bridget Strong, female    DOB: 02/06/1951, 64 y.o.   MRN: [id number]  HPI Patient seen for complete physical. She just retired last spring. This past summer she had salpingo-oophorectomy, abdominal sacrocolpopexy, anterior and posterior pair, and bladder suspension. She did well following surgery. She is very pleased with her results. She had colonoscopy 2008 which was normal. Recommended 10 year follow-up. She had Pneumovax this past summer. Will get Prevnar 13 next year. She needs flu vaccine. Her tetanus up-to-date.  Regular medications reviewed. She is compliant with all. Requesting refills. She gets yearly mammograms. Never had bone density scan. Exercises regularly.  Past Medical History  Diagnosis Date  . Chicken pox   . Hyperlipidemia   . Hypertension   . Anemia     with pregnancy  . Broken humerus 10-24-13  . Complication of anesthesia 1998    Developed bigeminy post op  . PONV (postoperative nausea and vomiting)    Past Surgical History  Procedure Laterality Date  . Cholecystectomy  2004  . Abdominal hysterectomy  1998    TVH--ovaries remain  . Tubal ligation    . Abdominal sacrocolpopexy N/A 11/23/2014    Procedure: ABDOMINAL SACROCOLPOPEXY/HALBAN'S CULDOPLASTY;  Surgeon: Nunzio Cobbs, MD;  Location: [facility];  Service: Gynecology;  Laterality: N/A;  . Salpingoophorectomy Bilateral 11/23/2014    Procedure: BILATERAL SALPINGO OOPHORECTOMY;  Surgeon: Nunzio Cobbs, MD;  Location: [facility];  Service: Gynecology;  Laterality: Bilateral;  . Anterior and posterior repair N/A 11/23/2014    Procedure: ANTERIOR (CYSTOCELE) ;  Surgeon: Nunzio Cobbs, MD;  Location: [facility];  Service: Gynecology;  Laterality: N/A;  . Bladder suspension N/A 11/23/2014    Procedure: EXACT MIDURETERAL SLING;  Surgeon: Nunzio Cobbs, MD;  Location: [facility];  Service: Gynecology;  Laterality: N/A;  . Cystoscopy N/A 11/23/2014    Procedure:  CYSTOSCOPY;  Surgeon: Nunzio Cobbs, MD;  Location: [facility];  Service: Gynecology;  Laterality: N/A;  . Lysis of adhesion N/A 11/23/2014    Procedure: LYSIS OF ADHESION;  Surgeon: Nunzio Cobbs, MD;  Location: [facility];  Service: Gynecology;  Laterality: N/A;    reports that she has never smoked. She does not have any smokeless tobacco history on file. She reports that she does not drink alcohol or use illicit drugs. family history includes Alcohol abuse in her father and other; Arthritis in her other; Breast cancer (age of onset: 11) in her paternal grandmother; Breast cancer (age of onset: 52) in her maternal grandmother; Hypertension in her other; Stroke in her father and other. Allergies  Allergen Reactions  . Diflucan [Fluconazole] Hives, Itching and Swelling  . Hydrocodone     Nausea, GI upset  . Tramadol Other (See Comments)    Makes her sick and feel weird      Review of Systems  Constitutional: Negative for fever, activity change, appetite change, fatigue and unexpected weight change.  HENT: Negative for ear pain, hearing loss, sore throat and trouble swallowing.   Eyes: Negative for visual disturbance.  Respiratory: Negative for cough and shortness of breath.   Cardiovascular: Negative for chest pain and palpitations.  Gastrointestinal: Negative for abdominal pain, diarrhea, constipation and blood in stool.  Endocrine: Negative for polydipsia and polyuria.  Genitourinary: Negative for dysuria and hematuria.  Musculoskeletal: Negative for myalgias, back pain and arthralgias.  Skin: Negative for rash.  Neurological: Negative  for dizziness, syncope and headaches.  Hematological: Negative for adenopathy.  Psychiatric/Behavioral: Negative for confusion and dysphoric mood.       Objective:   Physical Exam  Constitutional: She is oriented to person, place, and time. She appears well-developed and well-nourished.  HENT:  Head: Normocephalic and atraumatic.    Eyes: EOM are normal. Pupils are equal, round, and reactive to light.  Neck: Normal range of motion. Neck supple. No thyromegaly present.  Cardiovascular: Normal rate, regular rhythm and normal heart sounds.   No murmur heard. Pulmonary/Chest: Breath sounds normal. No respiratory distress. She has no wheezes. She has no rales.  Abdominal: Soft. Bowel sounds are normal. She exhibits no distension and no mass. There is no tenderness. There is no rebound and no guarding.  Musculoskeletal: Normal range of motion. She exhibits no edema.  Lymphadenopathy:    She has no cervical adenopathy.  Neurological: She is alert and oriented to person, place, and time. She displays normal reflexes. No cranial nerve deficit.  Skin: No rash noted.  Psychiatric: She has a normal mood and affect. Her behavior is normal. Judgment and thought content normal.          Assessment & Plan:  Complete physical. Will need Prevnar 13 next year. Flu vaccine given. Other immunizations up-to-date. Repeat colonoscopy in 2 years. Schedule bone density scan. Refill medications for one year. Recommend daily calcium 1200 mg and vitamin D one thousand international units

## 2015-02-14 NOTE — Progress Notes (Signed)
Pre visit review using our clinic review tool, if applicable. No additional management support is needed unless otherwise documented below in the visit note. 

## 2015-02-17 ENCOUNTER — Ambulatory Visit: Payer: Medicare Other | Admitting: Obstetrics and Gynecology

## 2015-02-22 ENCOUNTER — Ambulatory Visit (INDEPENDENT_AMBULATORY_CARE_PROVIDER_SITE_OTHER)
Admission: RE | Admit: 2015-02-22 | Discharge: 2015-02-22 | Disposition: A | Discharge status: Home / Self Care | Payer: Medicare Other | Source: Ambulatory Visit | Attending: Family Medicine | Admitting: Family Medicine

## 2015-02-22 DIAGNOSIS — Z780 Asymptomatic menopausal state: Secondary | ICD-10-CM | POA: Diagnosis not present

## 2015-02-23 ENCOUNTER — Encounter: Payer: Self-pay | Admitting: Obstetrics and Gynecology

## 2015-02-23 ENCOUNTER — Other Ambulatory Visit: Payer: Self-pay

## 2015-02-23 ENCOUNTER — Ambulatory Visit (INDEPENDENT_AMBULATORY_CARE_PROVIDER_SITE_OTHER): Payer: Medicare Other | Admitting: Obstetrics and Gynecology

## 2015-02-23 ENCOUNTER — Encounter: Payer: Self-pay | Admitting: Family Medicine

## 2015-02-23 VITALS — BP 110/66 | HR 70 | Ht 64.0 in | Wt 128.8 lb

## 2015-02-23 DIAGNOSIS — Z1231 Encounter for screening mammogram for malignant neoplasm of breast: Secondary | ICD-10-CM

## 2015-02-23 DIAGNOSIS — Z9889 Other specified postprocedural states: Secondary | ICD-10-CM

## 2015-02-23 MED ORDER — ESTRADIOL 0.1 MG/GM VA CREA: TOPICAL_CREAM | VAGINAL | Status: DC

## 2015-02-23 NOTE — Progress Notes (Signed)
Patient ID: Bridget Strong, female   DOB: 10-10-1950, 64 y.o.   MRN: [id number] GYNECOLOGY  VISIT   HPI: 64 y.o.   Married  Caucasian  female   [phone] with Patient's last menstrual period was 05/14/1996.   here for 3 month follow up. No problems.  Good bladder control.  Good bowel function.  Using Estrace oral and vaginal cream.   GYNECOLOGIC HISTORY: Patient's last menstrual period was 05/14/1996. Contraception: Postmenopausal/TVH Menopausal hormone therapy: Estrace 0.5mg  daily, Estrace cream 1/2 gm pv twice weekly. Last mammogram: 05-11-14 Dense/Neg/[facility] Last pap smear: 04-23-12 Neg         OB History    Gravida Para Term Preterm AB TAB SAB Ectopic Multiple Living   4 3 3  1  1   3          Patient Active Problem List   Diagnosis Date Noted  . Status post surgery 11/23/2014  . Vaginal vault prolapse after hysterectomy 11/09/2013  . BACTERIAL PNEUMONIA 06/13/2010  . FEVER UNSPECIFIED 06/09/2010  . HYPERLIPIDEMIA 02/09/2009  . HYPERTENSION 02/09/2009    Past Medical History  Diagnosis Date  . Chicken pox   . Hyperlipidemia   . Hypertension   . Anemia     with pregnancy  . Broken humerus 10-24-13  . Complication of anesthesia 1998    Developed bigeminy post op  . PONV (postoperative nausea and vomiting)     Past Surgical History  Procedure Laterality Date  . Cholecystectomy  2004  . Abdominal hysterectomy  1998    TVH--ovaries remain  . Tubal ligation    . Abdominal sacrocolpopexy N/A 11/23/2014    Procedure: ABDOMINAL SACROCOLPOPEXY/HALBAN'S CULDOPLASTY;  Surgeon: Nunzio Cobbs, MD;  Location: [facility];  Service: Gynecology;  Laterality: N/A;  . Salpingoophorectomy Bilateral 11/23/2014    Procedure: BILATERAL SALPINGO OOPHORECTOMY;  Surgeon: Nunzio Cobbs, MD;  Location: [facility];  Service: Gynecology;  Laterality: Bilateral;  . Anterior and posterior repair N/A 11/23/2014    Procedure: ANTERIOR  (CYSTOCELE) ;  Surgeon: Nunzio Cobbs, MD;  Location: [facility];  Service: Gynecology;  Laterality: N/A;  . Bladder suspension N/A 11/23/2014    Procedure: EXACT MIDURETERAL SLING;  Surgeon: Nunzio Cobbs, MD;  Location: [facility];  Service: Gynecology;  Laterality: N/A;  . Cystoscopy N/A 11/23/2014    Procedure: CYSTOSCOPY;  Surgeon: Nunzio Cobbs, MD;  Location: [facility];  Service: Gynecology;  Laterality: N/A;  . Lysis of adhesion N/A 11/23/2014    Procedure: LYSIS OF ADHESION;  Surgeon: Nunzio Cobbs, MD;  Location: [facility];  Service: Gynecology;  Laterality: N/A;    Current Outpatient Prescriptions  Medication Sig Dispense Refill  . Calcium Carbonate-Vitamin D (CALCIUM-VITAMIN D) 600-200 MG-UNIT CAPS Take by mouth daily.      Marland Kitchen docusate sodium (COLACE) 100 MG capsule Take 1 capsule (100 mg total) by mouth 2 (two) times daily. Take prn. 60 capsule 1  . estradiol (ESTRACE) 0.5 MG tablet Take 1 tablet (0.5 mg total) by mouth daily. 90 tablet 3  . lisinopril-hydrochlorothiazide (PRINZIDE,ZESTORETIC) 20-12.5 MG tablet Take 1 tablet by mouth daily. 90 tablet 3  . pneumococcal 23 valent vaccine (PNU-IMMUNE) 25 MCG/0.5ML injection Inject 0.5 mLs into the muscle tomorrow at 10 am. 2.5 mL 0  . pravastatin (PRAVACHOL) 40 MG tablet Take 1 tablet (40 mg total) by mouth daily. 90 tablet 3  . vitamin C (  ASCORBIC ACID) 500 MG tablet Take 500 mg by mouth daily.       No current facility-administered medications for this visit.     ALLERGIES: Diflucan; Hydrocodone; and Tramadol  Family History  Problem Relation Age of Onset  . Alcohol abuse Other   . Arthritis Other   . Hypertension Other   . Stroke Other   . Stroke Father   . Alcohol abuse Father   . Breast cancer Maternal Grandmother 60  . Breast cancer Paternal Grandmother 71    Social History   Social History  . Marital Status: Married    Spouse Name: N/A  . Number of Children: N/A  . Years of  Education: N/A   Occupational History  . Not on file.   Social History Main Topics  . Smoking status: Never Smoker   . Smokeless tobacco: Not on file  . Alcohol Use: No  . Drug Use: No  . Sexual Activity:    Partners: Male    Birth Control/ Protection: Surgical     Comment: TVH-ovaries remain   Other Topics Concern  . Not on file   Social History Narrative    ROS:  Pertinent items are noted in HPI.  PHYSICAL EXAMINATION:    BP 110/66 mmHg  Pulse 70  Ht 5\' 4"  (1.626 m)  Wt 128 lb 12.8 oz (58.423 kg)  BMI 22.10 kg/m2  LMP 05/14/1996    General appearance: alert, cooperative and appears stated age   Abdomen: Incision intact, soft, non-tender; bowel sounds normal; no masses,  no organomegaly   Pelvic: External genitalia:  no lesions              Urethra:  normal appearing urethra with no masses, tenderness or lesions              Bartholins and Skenes: normal                 Vagina: normal appearing vagina with normal color and discharge, no lesions.  Good support.  No erosion of mesh.                Cervix: absent        Bimanual Exam:  Uterus:  uterus absent              Adnexa: no mass, fullness, tenderness                Chaperone was present for exam.  ASSESSMENT  Status post Abdominal sacral colpopexy, Halban's culdoplasty, bilateral salpingo-oophorectomy, intraabdominal lysis of adhesions, TVT Exact mid urethral sling and cystoscopy, anterior colporrhaphy. Doing well.  On oral and vaginal Estrace.  PLAN  Counseled regarding OK to return to usual activity - exercise and sexual activity.  Rx for Estrace vaginal cream 1/2 gm pv at hs twice per week.    Estrace oral Rx from her PCP.  Annual exam and mammogram in January 2017.  Her gynecologist Dr. Melinda Crutch has now retired, and she chooses to come here for her annual exams.  [facility]  Discussed 3D.    An After Visit Summary was printed and given to the  patient.

## 2015-05-20 ENCOUNTER — Ambulatory Visit
Admission: RE | Admit: 2015-05-20 | Discharge: 2015-05-20 | Disposition: A | Discharge status: Home / Self Care | Payer: Medicare Other | Source: Ambulatory Visit

## 2015-05-20 DIAGNOSIS — Z1231 Encounter for screening mammogram for malignant neoplasm of breast: Secondary | ICD-10-CM | POA: Diagnosis not present

## 2015-05-27 ENCOUNTER — Encounter: Payer: Self-pay | Admitting: Obstetrics and Gynecology

## 2015-05-27 ENCOUNTER — Ambulatory Visit (INDEPENDENT_AMBULATORY_CARE_PROVIDER_SITE_OTHER): Payer: Medicare Other | Admitting: Obstetrics and Gynecology

## 2015-05-27 VITALS — BP 100/62 | HR 66 | Resp 18 | Ht 64.0 in | Wt 128.0 lb

## 2015-05-27 DIAGNOSIS — Z01419 Encounter for gynecological examination (general) (routine) without abnormal findings: Secondary | ICD-10-CM | POA: Diagnosis not present

## 2015-05-27 NOTE — Patient Instructions (Signed)

## 2015-05-27 NOTE — Progress Notes (Signed)
Patient ID: Bridget Strong, female   DOB: November 26, 1950, 65 y.o.   MRN: [id number] 64 y.o. [id number] Married Caucasian female here for annual exam.    Feeling great since surgery.  Is back to regular activity.  No leakage of urine.  Good bowel functioning.  Is appreciative of surgical care.   Having hot flashes at night.  Can live with it.  PCP gives her Estrace - 0.5 mg, which is not a change.  Already has refills of vaginal estrogen.  Is currently using this 1/2 gm twice a week.  No female or female dyspareunia.   PCP:  Dr. Elease Hashimoto  Patient's last menstrual period was 05/14/1996.          Sexually active: Yes.    The current method of family planning is status post hysterectomy.    Exercising: Yes.    Walking Smoker:  no  Health Maintenance: Pap:  04-23-12 Neg:no HPV done History of abnormal Pap:  no MMG:  05-23-15 3D/density cat.B/Neg/BiRads1:[facility] Colonoscopy:  05/2006 normal BMD:   02-23-15 - Result  Osteopenia of hips.  Dr. Elease Hashimoto TDaP:  TD 02-21-10 Screening Labs:  Hb today: PCP, Urine today: PCP   reports that she has never smoked. She does not have any smokeless tobacco history on file. She reports that she does not drink alcohol or use illicit drugs.  Past Medical History  Diagnosis Date  . Chicken pox   . Hyperlipidemia   . Hypertension   . Anemia     with pregnancy  . Broken humerus 10-24-13  . Complication of anesthesia 1998    Developed bigeminy post op  . PONV (postoperative nausea and vomiting)     Past Surgical History  Procedure Laterality Date  . Cholecystectomy  2004  . Abdominal hysterectomy  1998    TVH--ovaries remain  . Tubal ligation    . Abdominal sacrocolpopexy N/A 11/23/2014    Procedure: ABDOMINAL SACROCOLPOPEXY/HALBAN'S CULDOPLASTY;  Surgeon: Nunzio Cobbs, MD;  Location: [facility];  Service: Gynecology;  Laterality: N/A;  . Salpingoophorectomy Bilateral 11/23/2014    Procedure: BILATERAL SALPINGO OOPHORECTOMY;  Surgeon:  Nunzio Cobbs, MD;  Location: [facility];  Service: Gynecology;  Laterality: Bilateral;  . Anterior and posterior repair N/A 11/23/2014    Procedure: ANTERIOR (CYSTOCELE) ;  Surgeon: Nunzio Cobbs, MD;  Location: [facility];  Service: Gynecology;  Laterality: N/A;  . Bladder suspension N/A 11/23/2014    Procedure: EXACT MIDURETERAL SLING;  Surgeon: Nunzio Cobbs, MD;  Location: [facility];  Service: Gynecology;  Laterality: N/A;  . Cystoscopy N/A 11/23/2014    Procedure: CYSTOSCOPY;  Surgeon: Nunzio Cobbs, MD;  Location: [facility];  Service: Gynecology;  Laterality: N/A;  . Lysis of adhesion N/A 11/23/2014    Procedure: LYSIS OF ADHESION;  Surgeon: Nunzio Cobbs, MD;  Location: [facility];  Service: Gynecology;  Laterality: N/A;    Current Outpatient Prescriptions  Medication Sig Dispense Refill  . Calcium Carbonate-Vitamin D (CALCIUM-VITAMIN D) 600-200 MG-UNIT CAPS Take by mouth daily.      Marland Kitchen estradiol (ESTRACE) 0.1 MG/GM vaginal cream Use 1/2 g vaginally two times per week. 42.5 g 2  . estradiol (ESTRACE) 0.5 MG tablet Take 1 tablet (0.5 mg total) by mouth daily. 90 tablet 3  . lisinopril-hydrochlorothiazide (PRINZIDE,ZESTORETIC) 20-12.5 MG tablet Take 1 tablet by mouth daily. 90 tablet 3  . pneumococcal 23 valent vaccine (PNU-IMMUNE) 25 MCG/0.5ML injection  Inject 0.5 mLs into the muscle tomorrow at 10 am. 2.5 mL 0  . pravastatin (PRAVACHOL) 40 MG tablet Take 1 tablet (40 mg total) by mouth daily. 90 tablet 3  . vitamin C (ASCORBIC ACID) 500 MG tablet Take 500 mg by mouth daily.      Marland Kitchen docusate sodium (COLACE) 100 MG capsule Take 1 capsule (100 mg total) by mouth 2 (two) times daily. Take prn. (Patient not taking: Reported on 05/27/2015) 60 capsule 1   No current facility-administered medications for this visit.    Family History  Problem Relation Age of Onset  . Alcohol abuse Other   . Arthritis Other   . Hypertension Other   . Stroke Other   . Stroke  Father   . Alcohol abuse Father   . Breast cancer Maternal Grandmother 82  . Breast cancer Paternal Grandmother 7    ROS:  Pertinent items are noted in HPI.  Otherwise, a comprehensive ROS was negative.  Exam:   BP 100/62 mmHg  Pulse 66  Resp 18  Ht 5\' 4"  (1.626 m)  Wt 128 lb (58.06 kg)  BMI 21.96 kg/m2  LMP 05/14/1996    General appearance: alert, cooperative and appears stated age Head: Normocephalic, without obvious abnormality, atraumatic Neck: no adenopathy, supple, symmetrical, trachea midline and thyroid normal to inspection and palpation Lungs: clear to auscultation bilaterally Breasts: normal appearance, no masses or tenderness, Inspection negative, No nipple retraction or dimpling, No nipple discharge or bleeding, No axillary or supraclavicular adenopathy Heart: regular rate and rhythm Abdomen: soft, non-tender; bowel sounds normal; no masses,  no organomegaly Extremities: extremities normal, atraumatic, no cyanosis or edema Skin: Skin color, texture, turgor normal. No rashes or lesions Lymph nodes: Cervical, supraclavicular, and axillary nodes normal. No abnormal inguinal nodes palpated Neurologic: Grossly normal  Pelvic: External genitalia:  no lesions              Urethra:  normal appearing urethra with no masses, tenderness or lesions              Bartholins and Skenes: normal                 Vagina: normal appearing vagina with normal color and discharge, no lesions              Cervix: absent              Pap taken: No. Bimanual Exam:  Uterus:  uterus absent              Adnexa: normal adnexa and no mass, fullness, tenderness              Rectovaginal: Yes.  .  Confirms.              Anus:  normal sphincter tone, no lesions  Chaperone was present for exam.  Assessment:   Well woman visit with normal exam. Status post TAH/BSO/abdominal sacrocolpopexy/Halban's culdoplasty/TVT Exact midurethral sling/cysto/anterior colporrhaphy for complete procidentia. ERT  patient.  Osteopenia.   Plan: Yearly mammogram recommended after age 80.  Recommended self breast exam.  Pap and HR HPV as above. Discussed Calcium, Vitamin D, regular exercise program including cardiovascular and weight bearing exercise. Labs performed.  No..    Refills given on medications.  No..   Will call if needs refills of vaginal estrogen. I discussed the importance of use.  I also reviewed risks of DVT, PE, MI, stroke, and breast cancer.  Oral ERT from Dr. Elease Hashimoto, PCP.  Bone  density monitoring through PCP. Follow up annually and prn.     After visit summary provided.

## 2016-02-09 ENCOUNTER — Ambulatory Visit (INDEPENDENT_AMBULATORY_CARE_PROVIDER_SITE_OTHER): Payer: Medicare Other

## 2016-02-09 ENCOUNTER — Other Ambulatory Visit (INDEPENDENT_AMBULATORY_CARE_PROVIDER_SITE_OTHER): Payer: Medicare Other

## 2016-02-09 VITALS — BP 90/60 | HR 72 | Ht 64.0 in | Wt 128.0 lb

## 2016-02-09 DIAGNOSIS — Z23 Encounter for immunization: Secondary | ICD-10-CM | POA: Diagnosis not present

## 2016-02-09 DIAGNOSIS — Z0000 Encounter for general adult medical examination without abnormal findings: Secondary | ICD-10-CM

## 2016-02-09 DIAGNOSIS — Z7289 Other problems related to lifestyle: Secondary | ICD-10-CM

## 2016-02-09 DIAGNOSIS — R7989 Other specified abnormal findings of blood chemistry: Secondary | ICD-10-CM

## 2016-02-09 DIAGNOSIS — Z1159 Encounter for screening for other viral diseases: Secondary | ICD-10-CM | POA: Diagnosis not present

## 2016-02-09 LAB — HEPATIC FUNCTION PANEL
ALBUMIN: 3.9 g/dL (ref 3.5–5.2)
ALT: 16 U/L (ref 0–35)
AST: 12 U/L (ref 0–37)
Alkaline Phosphatase: 87 U/L (ref 39–117)
Bilirubin, Direct: 0.1 mg/dL (ref 0.0–0.3)
TOTAL PROTEIN: 6.8 g/dL (ref 6.0–8.3)
Total Bilirubin: 0.5 mg/dL (ref 0.2–1.2)

## 2016-02-09 LAB — LIPID PANEL
CHOLESTEROL: 211 mg/dL — AB (ref 0–200)
HDL: 48.1 mg/dL (ref >39.00)
NonHDL: 162.79
TRIGLYCERIDES: 270 mg/dL — AB (ref 0.0–149.0)
Total CHOL/HDL Ratio: 4
VLDL: 54 mg/dL — ABNORMAL HIGH (ref 0.0–40.0)

## 2016-02-09 LAB — CBC WITH DIFFERENTIAL/PLATELET
BASOS ABS: 0 10*3/uL (ref 0.0–0.1)
Basophils Relative: 0.4 % (ref 0.0–3.0)
EOS ABS: 0.1 10*3/uL (ref 0.0–0.7)
Eosinophils Relative: 1.1 % (ref 0.0–5.0)
HEMATOCRIT: 36.2 % (ref 36.0–46.0)
HEMOGLOBIN: 12.4 g/dL (ref 12.0–15.0)
LYMPHS ABS: 2 10*3/uL (ref 0.7–4.0)
Lymphocytes Relative: 42 % (ref 12.0–46.0)
MCHC: 34.3 g/dL (ref 30.0–36.0)
MCV: 91.7 fl (ref 78.0–100.0)
MONOS PCT: 6.5 % (ref 3.0–12.0)
Monocytes Absolute: 0.3 10*3/uL (ref 0.1–1.0)
NEUTROS ABS: 2.4 10*3/uL (ref 1.4–7.7)
Neutrophils Relative %: 50 % (ref 43.0–77.0)
Platelets: 234 10*3/uL (ref 150.0–400.0)
RBC: 3.95 Mil/uL (ref 3.87–5.11)
RDW: 12.6 % (ref 11.5–15.5)
WBC: 4.8 10*3/uL (ref 4.0–10.5)

## 2016-02-09 LAB — BASIC METABOLIC PANEL
BUN: 19 mg/dL (ref 6–23)
CALCIUM: 9 mg/dL (ref 8.4–10.5)
CO2: 25 meq/L (ref 19–32)
CREATININE: 0.73 mg/dL (ref 0.40–1.20)
Chloride: 104 mEq/L (ref 96–112)
GFR: 84.57 mL/min (ref >60.00)
GLUCOSE: 92 mg/dL (ref 70–99)
Potassium: 4.3 mEq/L (ref 3.5–5.1)
SODIUM: 139 meq/L (ref 135–145)

## 2016-02-09 LAB — TSH: TSH: 2.72 u[IU]/mL (ref 0.35–4.50)

## 2016-02-09 LAB — LDL CHOLESTEROL, DIRECT: Direct LDL: 124 mg/dL

## 2016-02-09 NOTE — Progress Notes (Signed)
Medical screening examination was performed by Wynetta Fines, RN. I was immediately available for consultation/collaboration. I agree with above. Delano Metz, FNP-C

## 2016-02-09 NOTE — Patient Instructions (Addendum)
Bridget Strong , Thank you for taking time to come for your Medicare Wellness Visit. I appreciate your ongoing commitment to your health goals. Please review the following plan we discussed and let me know if I can assist you in the future.   Will try to complete AD; Given copy  Referred to [facility] for questions [place] offers free advance directive forms, as well as assistance in completing the forms themselves. For assistance, contact the Spiritual Care Department at [phone], or the Clinical Social Work Department at [phone].  Personal safety issues reviewed:  1. Consider starting a community watch program per [facility] 2.  Changes batteries is smoke detector and/or carbon monoxide detector  3.  If you have firearms; keep them in a safe place 4.  Wear protection when in the sun; Always wear sunscreen or a hat; It is good to have your doctor check your skin annually or review any new areas of concern 5. Driving safety; Keep in the right lane; stay 3 car lengths behind the car in front of you on the highway; look 3 times prior to pulling out; carry your cell phone everywhere you go!   Learn about the Yellow Dot program:  The program allows first responders at your emergency to have access to who your physician is, as well as your medications and medical conditions.  Citizens requesting the Yellow Dot Packages should contact Master Corporal Nunzio Cobbs at the [facility] [phone] for the first week of the program and beginning the week after Easter citizens should contact their Scientist, physiological.    These are the goals we discussed: Goals    . Exercise 150 minutes per week (moderate activity)          Try to exercise 3 days a week; allowing 3 days between weight lifting Older adults aged 71 or older, who are generally fit and have no health conditions that limit their mobility, should try to be active daily and should do: at least  150 minutes of moderate aerobic activity such as cycling or walking every week, and  strength exercises on two or more days a week that work all the major muscles (legs, hips, back, abdomen, chest, shoulders and arms).      OR  75 minutes of vigorous aerobic activity such as running or a game of singles tennis every week, and  strength exercises on two or more days a week that work all the major muscles (legs, hips, back, abdomen, chest, shoulders and arms).     OR  a mix of moderate and vigorous aerobic activity every week. For example, two 30-minute runs, plus 30 minutes of fast walking, equates to 150 minutes of moderate aerobic activity, and  strength exercises on two or more days a week that work all the major muscles (legs, hips, back, abdomen, chest, shoulders and arms).  A rule of thumb is that one minute of vigorous activity provides the same health benefits as two minutes of moderate activity. You should also try to break up long periods of sitting with light activity, as sedentary behaviour is now considered an independent risk factor for ill health, no matter how much exercise you do. Find out why sitting is bad for your health. Older adults at risk of falls, such as people with weak legs, poor balance and some medical conditions, should do exercises to improve balance and co-ordination on at least two days a week. Examples include yoga, tai chi and  dancing.         This is a list of the screening recommended for you and due dates:  Health Maintenance  Topic Date Due  .  Hepatitis C: One time screening is recommended by Center for Disease Control  (CDC) for  adults born from [birth year] through 1965.   [birth year]  . Pneumonia vaccines (2 of 2 - PCV13) 11/25/2015  . Flu Shot  12/13/2015  . Colon Cancer Screening  05/14/2016  . Mammogram  05/19/2017  . Tetanus Vaccine  02/22/2020  . DEXA scan (bone density measurement)  Completed  . Shingles Vaccine  Completed   Fall Prevention in  the Home  Falls can cause injuries and can affect people from all age groups. There are many simple things that you can do to make your home safe and to help prevent falls. WHAT CAN I DO ON THE OUTSIDE OF MY HOME?  Regularly repair the edges of walkways and driveways and fix any cracks.  Remove high doorway thresholds.  Trim any shrubbery on the main path into your home.  Use bright outdoor lighting.  Clear walkways of debris and clutter, including tools and rocks.  Regularly check that handrails are securely fastened and in good repair. Both sides of any steps should have handrails.  Install guardrails along the edges of any raised decks or porches.  Have leaves, snow, and ice cleared regularly.  Use sand or salt on walkways during winter months.  In the garage, clean up any spills right away, including grease or oil spills. WHAT CAN I DO IN THE BATHROOM?  Use night lights.  Install grab bars by the toilet and in the tub and shower. Do not use towel bars as grab bars.  Use non-skid mats or decals on the floor of the tub or shower.  If you need to sit down while you are in the shower, use a plastic, non-slip stool.[place] Kitchen  Keep the floor dry. Immediately clean up any water that spills on the floor.  Remove soap buildup in the tub or shower on a regular basis.  Attach bath mats securely with double-sided non-slip rug tape.  Remove throw rugs and other tripping hazards from the floor. WHAT CAN I DO IN THE BEDROOM?  Use night lights.  Make sure that a bedside light is easy to reach.  Do not use oversized bedding that drapes onto the floor.  Have a firm chair that has side arms to use for getting dressed.  Remove throw rugs and other tripping hazards from the floor. WHAT CAN I DO IN THE KITCHEN?   Clean up any spills right away.  Avoid walking on wet floors.  Place frequently used items in easy-to-reach places.  If you need to reach for something above you, use a  sturdy step stool that has a grab bar.  Keep electrical cables out of the way.  Do not use floor polish or wax that makes floors slippery. If you have to use wax, make sure that it is non-skid floor wax.  Remove throw rugs and other tripping hazards from the floor. WHAT CAN I DO IN THE STAIRWAYS?  Do not leave any items on the stairs.  Make sure that there are handrails on both sides of the stairs. Fix handrails that are broken or loose. Make sure that handrails are as long as the stairways.  Check any carpeting to make sure that it is firmly attached to the stairs. Fix any carpet that is loose or  worn.  Avoid having throw rugs at the top or bottom of stairways, or secure the rugs with carpet tape to prevent them from moving.  Make sure that you have a light switch at the top of the stairs and the bottom of the stairs. If you do not have them, have them installed. WHAT ARE SOME OTHER FALL PREVENTION TIPS?  Wear closed-toe shoes that fit well and support your feet. Wear shoes that have rubber soles or low heels.  When you use a stepladder, make sure that it is completely opened and that the sides are firmly locked. Have someone hold the ladder while you are using it. Do not climb a closed stepladder.  Add color or contrast paint or tape to grab bars and handrails in your home. Place contrasting color strips on the first and last steps.  Use mobility aids as needed, such as canes, walkers, scooters, and crutches.  Turn on lights if it is dark. Replace any light bulbs that burn out.  Set up furniture so that there are clear paths. Keep the furniture in the same spot.  Fix any uneven floor surfaces.  Choose a carpet design that does not hide the edge of steps of a stairway.  Be aware of any and all pets.  Review your medicines with your healthcare provider. Some medicines can cause dizziness or changes in blood pressure, which increase your risk of falling. Talk with your health  care provider about other ways that you can decrease your risk of falls. This may include working with a physical therapist or trainer to improve your strength, balance, and endurance.   This information is not intended to replace advice given to you by your health care provider. Make sure you discuss any questions you have with your health care provider.   Document Released: 04/20/2002 Document Revised: 09/14/2014 Document Reviewed: 06/04/2014 Elsevier Interactive Patient Education 2016 [place] Maintenance, Female Adopting a healthy lifestyle and getting preventive care can go a long way to promote health and wellness. Talk with your health care provider about what schedule of regular examinations is right for you. This is a good chance for you to check in with your provider about disease prevention and staying healthy. In between checkups, there are plenty of things you can do on your own. Experts have done a lot of research about which lifestyle changes and preventive measures are most likely to keep you healthy. Ask your health care provider for more information. WEIGHT AND DIET  Eat a healthy diet  Be sure to include plenty of vegetables, fruits, low-fat dairy products, and lean protein.  Do not eat a lot of foods high in solid fats, added sugars, or salt.  Get regular exercise. This is one of the most important things you can do for your health.  Most adults should exercise for at least 150 minutes each week. The exercise should increase your heart rate and make you sweat (moderate-intensity exercise).  Most adults should also do strengthening exercises at least twice a week. This is in addition to the moderate-intensity exercise.  Maintain a healthy weight  Body mass index (BMI) is a measurement that can be used to identify possible weight problems. It estimates body fat based on height and weight. Your health care provider can help determine your BMI and help you achieve  or maintain a healthy weight.  For females 66 years of age and older:   A BMI below 18.5 is considered underweight.  A BMI  of 18.5 to 24.9 is normal.  A BMI of 25 to 29.9 is considered overweight.  A BMI of 30 and above is considered obese.  Watch levels of cholesterol and blood lipids  You should start having your blood tested for lipids and cholesterol at 67 years of age, then have this test every 5 years.  You may need to have your cholesterol levels checked more often if:  Your lipid or cholesterol levels are high.  You are older than 67 years of age.  You are at high risk for heart disease.  CANCER SCREENING   Lung Cancer  Lung cancer screening is recommended for adults 41-33 years old who are at high risk for lung cancer because of a history of smoking.  A yearly low-dose CT scan of the lungs is recommended for people who:  Currently smoke.  Have quit within the past 15 years.  Have at least a 30-pack-year history of smoking. A pack year is smoking an average of one pack of cigarettes a day for 1 year.  Yearly screening should continue until it has been 15 years since you quit.  Yearly screening should stop if you develop a health problem that would prevent you from having lung cancer treatment.  Breast Cancer  Practice breast self-awareness. This means understanding how your breasts normally appear and feel.  It also means doing regular breast self-exams. Let your health care provider know about any changes, no matter how small.  If you are in your 20s or 30s, you should have a clinical breast exam (CBE) by a health care provider every 1-3 years as part of a regular health exam.  If you are 39 or older, have a CBE every year. Also consider having a breast X-ray (mammogram) every year.  If you have a family history of breast cancer, talk to your health care provider about genetic screening.  If you are at high risk for breast cancer, talk to your health  care provider about having an MRI and a mammogram every year.  Breast cancer gene (BRCA) assessment is recommended for women who have family members with BRCA-related cancers. BRCA-related cancers include:  Breast.  Ovarian.  Tubal.  Peritoneal cancers.  Results of the assessment will determine the need for genetic counseling and BRCA1 and BRCA2 testing. Cervical Cancer Your health care provider may recommend that you be screened regularly for cancer of the pelvic organs (ovaries, uterus, and vagina). This screening involves a pelvic examination, including checking for microscopic changes to the surface of your cervix (Pap test). You may be encouraged to have this screening done every 3 years, beginning at age 78.  For women ages 4-65, health care providers may recommend pelvic exams and Pap testing every 3 years, or they may recommend the Pap and pelvic exam, combined with testing for human papilloma virus (HPV), every 5 years. Some types of HPV increase your risk of cervical cancer. Testing for HPV may also be done on women of any age with unclear Pap test results.  Other health care providers may not recommend any screening for nonpregnant women who are considered low risk for pelvic cancer and who do not have symptoms. Ask your health care provider if a screening pelvic exam is right for you.  If you have had past treatment for cervical cancer or a condition that could lead to cancer, you need Pap tests and screening for cancer for at least 20 years after your treatment. If Pap tests have been discontinued,  your risk factors (such as having a new sexual partner) need to be reassessed to determine if screening should resume. Some women have medical problems that increase the chance of getting cervical cancer. In these cases, your health care provider may recommend more frequent screening and Pap tests. Colorectal Cancer  This type of cancer can be detected and often prevented.  Routine  colorectal cancer screening usually begins at 67 years of age and continues through 67 years of age.  Your health care provider may recommend screening at an earlier age if you have risk factors for colon cancer.  Your health care provider may also recommend using home test kits to check for hidden blood in the stool.  A small camera at the end of a tube can be used to examine your colon directly (sigmoidoscopy or colonoscopy). This is done to check for the earliest forms of colorectal cancer.  Routine screening usually begins at age 33.  Direct examination of the colon should be repeated every 5-10 years through 67 years of age. However, you may need to be screened more often if early forms of precancerous polyps or small growths are found. Skin Cancer  Check your skin from head to toe regularly.  Tell your health care provider about any new moles or changes in moles, especially if there is a change in a mole's shape or color.  Also tell your health care provider if you have a mole that is larger than the size of a pencil eraser.  Always use sunscreen. Apply sunscreen liberally and repeatedly throughout the day.  Protect yourself by wearing long sleeves, pants, a wide-brimmed hat, and sunglasses whenever you are outside. HEART DISEASE, DIABETES, AND HIGH BLOOD PRESSURE   High blood pressure causes heart disease and increases the risk of stroke. High blood pressure is more likely to develop in:  People who have blood pressure in the high end of the normal range (130-139/85-89 mm Hg).  People who are overweight or obese.  People who are African American.  If you are 34-55 years of age, have your blood pressure checked every 3-5 years. If you are 95 years of age or older, have your blood pressure checked every year. You should have your blood pressure measured twice--once when you are at a hospital or clinic, and once when you are not at a hospital or clinic. Record the average of the  two measurements. To check your blood pressure when you are not at a hospital or clinic, you can use:  An automated blood pressure machine at a pharmacy.  A home blood pressure monitor.  If you are between 86 years and 25 years old, ask your health care provider if you should take aspirin to prevent strokes.  Have regular diabetes screenings. This involves taking a blood sample to check your fasting blood sugar level.  If you are at a normal weight and have a low risk for diabetes, have this test once every three years after 67 years of age.  If you are overweight and have a high risk for diabetes, consider being tested at a younger age or more often. PREVENTING INFECTION  Hepatitis B  If you have a higher risk for hepatitis B, you should be screened for this virus. You are considered at high risk for hepatitis B if:  You were born in a country where hepatitis B is common. Ask your health care provider which countries are considered high risk.  Your parents were born in a high-risk  country, and you have not been immunized against hepatitis B (hepatitis B vaccine).  You have HIV or AIDS.  You use needles to inject street drugs.  You live with someone who has hepatitis B.  You have had sex with someone who has hepatitis B.  You get hemodialysis treatment.  You take certain medicines for conditions, including cancer, organ transplantation, and autoimmune conditions. Hepatitis C  Blood testing is recommended for:  Everyone born from [birth year] through 1967.  Anyone with known risk factors for hepatitis C. Sexually transmitted infections (STIs)  You should be screened for sexually transmitted infections (STIs) including gonorrhea and chlamydia if:  You are sexually active and are younger than 67 years of age.  You are older than 67 years of age and your health care provider tells you that you are at risk for this type of infection.  Your sexual activity has changed since you were  last screened and you are at an increased risk for chlamydia or gonorrhea. Ask your health care provider if you are at risk.  If you do not have HIV, but are at risk, it may be recommended that you take a prescription medicine daily to prevent HIV infection. This is called pre-exposure prophylaxis (PrEP). You are considered at risk if:  You are sexually active and do not regularly use condoms or know the HIV status of your partner(s).  You take drugs by injection.  You are sexually active with a partner who has HIV. Talk with your health care provider about whether you are at high risk of being infected with HIV. If you choose to begin PrEP, you should first be tested for HIV. You should then be tested every 3 months for as long as you are taking PrEP.  PREGNANCY   If you are premenopausal and you may become pregnant, ask your health care provider about preconception counseling.  If you may become pregnant, take 400 to 800 micrograms (mcg) of folic acid every day.  If you want to prevent pregnancy, talk to your health care provider about birth control (contraception). OSTEOPOROSIS AND MENOPAUSE   Osteoporosis is a disease in which the bones lose minerals and strength with aging. This can result in serious bone fractures. Your risk for osteoporosis can be identified using a bone density scan.  If you are 49 years of age or older, or if you are at risk for osteoporosis and fractures, ask your health care provider if you should be screened.  Ask your health care provider whether you should take a calcium or vitamin D supplement to lower your risk for osteoporosis.  Menopause may have certain physical symptoms and risks.  Hormone replacement therapy may reduce some of these symptoms and risks. Talk to your health care provider about whether hormone replacement therapy is right for you.  HOME CARE INSTRUCTIONS   Schedule regular health, dental, and eye exams.  Stay current with your  immunizations.   Do not use any tobacco products including cigarettes, chewing tobacco, or electronic cigarettes.  If you are pregnant, do not drink alcohol.  If you are breastfeeding, limit how much and how often you drink alcohol.  Limit alcohol intake to no more than 1 drink per day for nonpregnant women. One drink equals 12 ounces of beer, 5 ounces of wine, or 1 ounces of hard liquor.  Do not use street drugs.  Do not share needles.  Ask your health care provider for help if you need support or information about quitting  drugs.  Tell your health care provider if you often feel depressed.  Tell your health care provider if you have ever been abused or do not feel safe at home.   This information is not intended to replace advice given to you by your health care provider. Make sure you discuss any questions you have with your health care provider.   Document Released: 11/13/2010 Document Revised: 05/21/2014 Document Reviewed: 04/01/2013 Elsevier Interactive Patient Education [employer].

## 2016-02-09 NOTE — Progress Notes (Signed)
Subjective:   Bridget Strong is a 65 y.o. female who presents for an Initial Medicare Annual Wellness Visit.  HRA assessment completed during this visit with Ms. Chura post labs today  The Patient was informed that the wellness visit is to identify future health risk and educate and initiate measures that can reduce risk for increased disease through the lifespan.    NO ROS; Medicare Wellness Visit today; OV Dr. Elease Hashimoto on 10/4   Describes health as good, fair or great?  Great   Risk Associated with PMH  HTN medical managed  Hyperlipidemia; recommended low cholesterol diet;  Given information on foods high in chol s/p Ant repair 2016; feels great  LABS  Trig 268; HDL 46; LDL 129  Educated on fat   Psychosocial / lives with spouse 3 sons; live in the area  Living situation; 2 level home   Primary Prevention  Tobacco never smoked  ETOH no alcohol   Diet ( watch fat intake) Given list of foods high in cholesterol  No breakfast just coffee Trying to watch her weight; used to run; now finding other options post surgery   Exercise for weight bearing and overall health;  Does the bike; hand crank bike to help with shoulder issues. Couple of weight machines  2 times a week Is up and down stairs when at home  Dental; now no issues  Safety Fall hx; no Safety for self and community given in writing    Screenings for secondary risk/ GYN exam 05/2015 Dr. Judeth Horn  Hep C screening DUE  Mammogram 05/2015; negative  Dexa 02/2015/ Dr. Elease Hashimoto reviewed and did not recommend med; repeat x 2 years. (2016)/ takes 2 calcium and Vit 1000 vit d  colonoscopy 05/2006; every 10 years; 05/2016 EKG 11/2014  Eye exam; time to reschedule Have to use readers; will schedule eye exam this year Needs readers and that is it   Vaccination update: PCV 13  and Flu shot due / will take both of these today   Medications reviewed for issues;   Cardiac Risk Factors include: advanced age  (>63mn, >>41women);dyslipidemia;hypertension     Objective:    Today's Vitals   02/09/16 0933  BP: 90/60  Pulse: 72  SpO2: 98%  Weight: 128 lb (58.1 kg)  Height: _0  (1.626 m)   Body mass index is 21.97 kg/m.   Current Medications (verified) Outpatient Encounter Prescriptions as of 02/09/2016  Medication Sig  . Calcium Carbonate-Vitamin D (CALCIUM-VITAMIN D) 600-200 MG-UNIT CAPS Take by mouth daily.    .Marland Kitchendocusate sodium (COLACE) 100 MG capsule Take 1 capsule (100 mg total) by mouth 2 (two) times daily. Take prn.  . estradiol (ESTRACE) 0.1 MG/GM vaginal cream Use 1/2 g vaginally two times per week.  . estradiol (ESTRACE) 0.5 MG tablet Take 1 tablet (0.5 mg total) by mouth daily.  .Marland Kitchenlisinopril-hydrochlorothiazide (PRINZIDE,ZESTORETIC) 20-12.5 MG tablet Take 1 tablet by mouth daily.  . pravastatin (PRAVACHOL) 40 MG tablet Take 1 tablet (40 mg total) by mouth daily.  . vitamin C (ASCORBIC ACID) 500 MG tablet Take 500 mg by mouth daily.    . pneumococcal 23 valent vaccine (PNU-IMMUNE) 25 MCG/0.5ML injection Inject 0.5 mLs into the muscle tomorrow at 10 am.   No facility-administered encounter medications on file as of 02/09/2016.     Allergies (verified) Diflucan [fluconazole]; Hydrocodone; and Tramadol   History: Past Medical History:  Diagnosis Date  . Anemia    with pregnancy  . Broken humerus  10-24-13  . Chicken pox   . Complication of anesthesia 1998   Developed bigeminy post op  . Hyperlipidemia   . Hypertension   . PONV (postoperative nausea and vomiting)    Past Surgical History:  Procedure Laterality Date  . ABDOMINAL HYSTERECTOMY  1998   TVH--ovaries remain  . ABDOMINAL SACROCOLPOPEXY N/A 11/23/2014   Procedure: ABDOMINAL SACROCOLPOPEXY/HALBAN'S CULDOPLASTY;  Surgeon: Nunzio Cobbs, MD;  Location: [facility];  Service: Gynecology;  Laterality: N/A;  . ANTERIOR AND POSTERIOR REPAIR N/A 11/23/2014   Procedure: ANTERIOR (CYSTOCELE) ;  Surgeon: Nunzio Cobbs, MD;  Location: [facility];  Service: Gynecology;  Laterality: N/A;  . BLADDER SUSPENSION N/A 11/23/2014   Procedure: EXACT MIDURETERAL SLING;  Surgeon: Nunzio Cobbs, MD;  Location: [facility];  Service: Gynecology;  Laterality: N/A;  . CHOLECYSTECTOMY  2004  . CYSTOSCOPY N/A 11/23/2014   Procedure: CYSTOSCOPY;  Surgeon: Nunzio Cobbs, MD;  Location: [facility];  Service: Gynecology;  Laterality: N/A;  . LYSIS OF ADHESION N/A 11/23/2014   Procedure: LYSIS OF ADHESION;  Surgeon: Nunzio Cobbs, MD;  Location: [facility];  Service: Gynecology;  Laterality: N/A;  . SALPINGOOPHORECTOMY Bilateral 11/23/2014   Procedure: BILATERAL SALPINGO OOPHORECTOMY;  Surgeon: Nunzio Cobbs, MD;  Location: [facility];  Service: Gynecology;  Laterality: Bilateral;  . TUBAL LIGATION     Family History  Problem Relation Age of Onset  . Alcohol abuse Other   . Arthritis Other   . Hypertension Other   . Stroke Other   . Stroke Father   . Alcohol abuse Father   . Breast cancer Maternal Grandmother 66  . Breast cancer Paternal Grandmother 20   Social History   Occupational History  . Not on file.   Social History Main Topics  . Smoking status: Never Smoker  . Smokeless tobacco: Never Used  . Alcohol use No  . Drug use: No  . Sexual activity: Yes    Partners: Male    Birth control/ protection: Surgical     Comment: TVH-ovaries remain    Tobacco Counseling Counseling given: Yes   Activities of Daily Living In your present state of health, do you have any difficulty performing the following activities: 02/09/2016  Hearing? N  Vision? N  Difficulty concentrating or making decisions? N  Walking or climbing stairs? N  Dressing or bathing? N  Doing errands, shopping? N  Preparing Food and eating ? N  Using the Toilet? N  In the past six months, have you accidently leaked urine? N  Do you have problems with loss of bowel control? N  Managing your Medications? N   Managing your Finances? N  Housekeeping or managing your Housekeeping? N  Some recent data might be hidden    Immunizations and Health Maintenance Immunization History  Administered Date(s) Administered  . Influenza Split 02/19/2011, 03/07/2012  . Influenza Whole 02/21/2010  . Influenza, High Dose Seasonal PF 02/14/2015  . Influenza,inj,Quad PF,36+ Mos 03/16/2013, 03/17/2014  . Pneumococcal Polysaccharide-23 11/25/2014  . Td 02/21/2010  . Zoster 03/07/2012   Health Maintenance Due  Topic Date Due  . Hepatitis C Screening  1950-10-31  . PNA vac Low Risk Adult (2 of 2 - PCV13) 11/25/2015  . INFLUENZA VACCINE  12/13/2015    Patient Care Team: Eulas Post, MD as PCP - General  Indicate any recent Medical Services you may have received from other than [facility] providers  in the past year (date may be approximate).     Assessment:   This is a routine wellness examination for Chakira.   Checked eye site in the office. No vision issues; will schedule eye exam soon   Hearing/Vision screen  Hearing Screening   _0  _1  _2  _3  _4  _5  _6  _7  _8   Right ear:     100      Left ear:     100        Dietary issues and exercise activities discussed: Current Exercise Habits: Structured exercise class, Time (Minutes): 60, Frequency (Times/Week): 3, Weekly Exercise (Minutes/Week): 180, Intensity: Moderate  Goals    . Exercise 150 minutes per week (moderate activity)          Try to exercise 3 days a week; allowing 3 days between weight lifting Older adults aged 32 or older, who are generally fit and have no health conditions that limit their mobility, should try to be active daily and should do: at least 150 minutes of moderate aerobic activity such as cycling or walking every week, and  strength exercises on two or more days a week that work all the major muscles (legs, hips, back, abdomen, chest, shoulders and arms).      OR  75 minutes of vigorous  aerobic activity such as running or a game of singles tennis every week, and  strength exercises on two or more days a week that work all the major muscles (legs, hips, back, abdomen, chest, shoulders and arms).     OR  a mix of moderate and vigorous aerobic activity every week. For example, two 30-minute runs, plus 30 minutes of fast walking, equates to 150 minutes of moderate aerobic activity, and  strength exercises on two or more days a week that work all the major muscles (legs, hips, back, abdomen, chest, shoulders and arms).  A rule of thumb is that one minute of vigorous activity provides the same health benefits as two minutes of moderate activity. You should also try to break up long periods of sitting with light activity, as sedentary behaviour is now considered an independent risk factor for ill health, no matter how much exercise you do. Find out why sitting is bad for your health. Older adults at risk of falls, such as people with weak legs, poor balance and some medical conditions, should do exercises to improve balance and co-ordination on at least two days a week. Examples include yoga, tai chi and dancing.        Depression Screen PHQ 2/9 Scores 02/09/2016 02/14/2015  PHQ - 2 Score 0 0    Fall Risk Fall Risk  02/09/2016 02/14/2015  Falls in the past year? No No    Cognitive Function: MMSE - Mini Mental State Exam 02/09/2016  Not completed: (No Data)   Ad8 score 0   Screening Tests Health Maintenance  Topic Date Due  . Hepatitis C Screening  [birth year]  . PNA vac Low Risk Adult (2 of 2 - PCV13) 11/25/2015  . INFLUENZA VACCINE  12/13/2015  . COLONOSCOPY  05/14/2016  . MAMMOGRAM  05/19/2017  . TETANUS/TDAP  02/22/2020  . DEXA SCAN  Completed  . ZOSTAVAX  Completed      Plan:   Will try to complete AD;  Referred to [facility] for questions [place] offers free advance directive forms, as well as assistance in completing the forms themselves. For assistance,  contact the Spiritual Care Department at [phone], or the Clinical Social Work  Department at [phone].  Personal safety issues reviewed:   Took [place] flu shot and Prevnar today   During the course of the visit, Amber was educated and counseled about the following appropriate screening and preventive services:   Vaccines to include Pneumoccal, Influenza, Hepatitis B, Td, Zostavax, HCV  Electrocardiogram  Cardiovascular disease screening  Colorectal cancer screening  Bone density screening/ taking calcium and vit d  Eats a lot of yogurt and cheese and gets calcium in food.  May take one calcium with Vit d and 1000u Vit d supplement if she is not in the sun.   Diabetes screening  Glaucoma screening  Mammography/completed   Nutrition counseling  Smoking cessation counseling n/a  Patient Instructions (the written plan) were given to the patient.    Wynetta Fines, RN   02/09/2016

## 2016-02-10 LAB — HEPATITIS C ANTIBODY: HCV Ab: NEGATIVE

## 2016-02-15 ENCOUNTER — Encounter: Payer: Self-pay | Admitting: Family Medicine

## 2016-02-15 ENCOUNTER — Ambulatory Visit (INDEPENDENT_AMBULATORY_CARE_PROVIDER_SITE_OTHER): Payer: Medicare Other | Admitting: Family Medicine

## 2016-02-15 VITALS — BP 100/60 | HR 75 | Temp 98.0°F | Ht 63.75 in | Wt 130.1 lb

## 2016-02-15 DIAGNOSIS — Z0000 Encounter for general adult medical examination without abnormal findings: Secondary | ICD-10-CM

## 2016-02-15 MED ORDER — LISINOPRIL-HYDROCHLOROTHIAZIDE 20-12.5 MG PO TABS: 1.0000 | ORAL_TABLET | Freq: Every day | ORAL | 3 refills | Status: DC

## 2016-02-15 MED ORDER — PRAVASTATIN SODIUM 40 MG PO TABS: 40.0000 mg | ORAL_TABLET | Freq: Every day | ORAL | 3 refills | Status: DC

## 2016-02-15 MED ORDER — ESTRADIOL 0.5 MG PO TABS: 0.5000 mg | ORAL_TABLET | Freq: Every day | ORAL | 3 refills | Status: DC

## 2016-02-15 NOTE — Progress Notes (Signed)
Subjective:     Patient ID: Bridget Strong, female   DOB: June 06, 1950, 65 y.o.   MRN: [id number]  HPI Patient is seen for physical exam. She had recent Medicare wellness visit and is here for complete physical today. She still sees gynecologist and she's had previous hysterectomy. She is maintained on oral estrogen. She's had severe hot flashes when she has tried to quit several times in the past. Her health maintenance is up-to-date. She's had flu vaccine already this season. She has hypertension treated with lisinopril HCTZ and takes pravastatin for hyperlipidemia. No cardiac history. Exercises regularly 3 days per week. Tries to do weightbearing exercise. Takes regular calcium and vitamin D. Never smoked. No alcohol use.  Past Medical History:  Diagnosis Date  . Anemia    with pregnancy  . Broken humerus 10-24-13  . Chicken pox   . Complication of anesthesia 1998   Developed bigeminy post op  . Hyperlipidemia   . Hypertension   . PONV (postoperative nausea and vomiting)    Past Surgical History:  Procedure Laterality Date  . ABDOMINAL HYSTERECTOMY  1998   TVH--ovaries remain  . ABDOMINAL SACROCOLPOPEXY N/A 11/23/2014   Procedure: ABDOMINAL SACROCOLPOPEXY/HALBAN'S CULDOPLASTY;  Surgeon: Nunzio Cobbs, MD;  Location: [facility];  Service: Gynecology;  Laterality: N/A;  . ANTERIOR AND POSTERIOR REPAIR N/A 11/23/2014   Procedure: ANTERIOR (CYSTOCELE) ;  Surgeon: Nunzio Cobbs, MD;  Location: [facility];  Service: Gynecology;  Laterality: N/A;  . BLADDER SUSPENSION N/A 11/23/2014   Procedure: EXACT MIDURETERAL SLING;  Surgeon: Nunzio Cobbs, MD;  Location: [facility];  Service: Gynecology;  Laterality: N/A;  . CHOLECYSTECTOMY  2004  . CYSTOSCOPY N/A 11/23/2014   Procedure: CYSTOSCOPY;  Surgeon: Nunzio Cobbs, MD;  Location: [facility];  Service: Gynecology;  Laterality: N/A;  . LYSIS OF ADHESION N/A 11/23/2014   Procedure: LYSIS OF ADHESION;  Surgeon: Nunzio Cobbs, MD;  Location: [facility];  Service: Gynecology;  Laterality: N/A;  . SALPINGOOPHORECTOMY Bilateral 11/23/2014   Procedure: BILATERAL SALPINGO OOPHORECTOMY;  Surgeon: Nunzio Cobbs, MD;  Location: [facility];  Service: Gynecology;  Laterality: Bilateral;  . TUBAL LIGATION      reports that she has never smoked. She has never used smokeless tobacco. She reports that she does not drink alcohol or use drugs. family history includes Alcohol abuse in her father and other; Arthritis in her other; Breast cancer (age of onset: 52) in her paternal grandmother; Breast cancer (age of onset: 1) in her maternal grandmother; Hypertension in her other; Stroke in her father and other. Allergies  Allergen Reactions  . Diflucan [Fluconazole] Hives, Itching and Swelling  . Hydrocodone     Nausea, GI upset  . Tramadol Other (See Comments)    Makes her sick and feel weird     Review of Systems  Constitutional: Negative for fatigue.  HENT: Negative for trouble swallowing.   Eyes: Negative for visual disturbance.  Respiratory: Negative for cough, chest tightness, shortness of breath and wheezing.   Cardiovascular: Negative for chest pain, palpitations and leg swelling.  Gastrointestinal: Negative for abdominal pain.  Endocrine: Negative for polydipsia and polyuria.  Genitourinary: Negative for dysuria.  Musculoskeletal: Negative for back pain.  Neurological: Negative for dizziness, seizures, syncope, weakness, light-headedness and headaches.  Hematological: Negative for adenopathy. Does not bruise/bleed easily.  Psychiatric/Behavioral: Negative for dysphoric mood.       Objective:   Physical  Exam  Constitutional: She is oriented to person, place, and time. She appears well-developed and well-nourished.  HENT:  Head: Normocephalic and atraumatic.  Eyes: EOM are normal. Pupils are equal, round, and reactive to light.  Neck: Normal range of motion. Neck supple. No thyromegaly  present.  Cardiovascular: Normal rate, regular rhythm and normal heart sounds.   No murmur heard. Pulmonary/Chest: Breath sounds normal. No respiratory distress. She has no wheezes. She has no rales.  Abdominal: Soft. Bowel sounds are normal. She exhibits no distension and no mass. There is no tenderness. There is no rebound and no guarding.  Musculoskeletal: Normal range of motion. She exhibits no edema.  Lymphadenopathy:    She has no cervical adenopathy.  Neurological: She is alert and oriented to person, place, and time. She displays normal reflexes. No cranial nerve deficit.  Skin: No rash noted.  Psychiatric: She has a normal mood and affect. Her behavior is normal. Judgment and thought content normal.       Assessment:     Physical exam.  Healthy 65 year old female with well-controlled hypertension. She does have mildly elevated triglycerides but stable    Plan:     -Discussed dietary factors that affect triglycerides -Repeat colon cancer screening by next year. She had specific questions regarding stool base DNA testing which she would like to consider-by next year. -Refill medications for one year -Immunizations are all up-to-date  Eulas Post MD [facility]

## 2016-02-21 ENCOUNTER — Other Ambulatory Visit: Payer: Self-pay | Admitting: Obstetrics and Gynecology

## 2016-02-21 DIAGNOSIS — Z1231 Encounter for screening mammogram for malignant neoplasm of breast: Secondary | ICD-10-CM

## 2016-05-21 ENCOUNTER — Ambulatory Visit
Admission: RE | Admit: 2016-05-21 | Discharge: 2016-05-21 | Disposition: A | Discharge status: Home / Self Care | Payer: Medicare Other | Source: Ambulatory Visit | Attending: Obstetrics and Gynecology | Admitting: Obstetrics and Gynecology

## 2016-05-21 DIAGNOSIS — Z1231 Encounter for screening mammogram for malignant neoplasm of breast: Secondary | ICD-10-CM | POA: Diagnosis not present

## 2016-06-13 ENCOUNTER — Ambulatory Visit (INDEPENDENT_AMBULATORY_CARE_PROVIDER_SITE_OTHER): Payer: Medicare Other | Admitting: Obstetrics and Gynecology

## 2016-06-13 ENCOUNTER — Encounter: Payer: Self-pay | Admitting: Obstetrics and Gynecology

## 2016-06-13 VITALS — BP 123/57 | HR 74 | Resp 20 | Ht 64.0 in | Wt 131.2 lb

## 2016-06-13 DIAGNOSIS — Z01419 Encounter for gynecological examination (general) (routine) without abnormal findings: Secondary | ICD-10-CM

## 2016-06-13 MED ORDER — ESTRADIOL 0.1 MG/GM VA CREA: TOPICAL_CREAM | VAGINAL | 2 refills | Status: DC

## 2016-06-13 NOTE — Progress Notes (Signed)
66 y.o. G74P3013 Married Caucasian female here for annual exam.    No recurrent prolapse.  No bladder or bowel problems.  Sexually active and no pain or problems other than dryness. Ran out of vaginal estrogen cream.  PCP gives her her oral estrogen Rx.   PCP:   Carolann Littler, MD  Patient's last menstrual period was 05/14/1996.           Sexually active: Yes.   female The current method of family planning is status post hysterectomy.    Exercising: Yes.    Gym 2x/week and upper body weights(light) Smoker:  no  Health Maintenance: Pap:  04-23-12 Negative History of abnormal Pap:  no MMG:  05-21-16 Density B/Neg/BiRads1:TBC Colonoscopy:  05/2006 - normal. BMD: 02-22-15  Result:Osteopenia:[place] TDaP:  02-21-10 Td Gardasil:   N/A HIV:  Will dx PCP. Hep C:  Will dw PCP. Screening Labs:  Hb today: PCP, Urine today: PCP   reports that she has never smoked. She has never used smokeless tobacco. She reports that she does not drink alcohol or use drugs.  Past Medical History:  Diagnosis Date  . Anemia    with pregnancy  . Broken humerus 10-24-13  . Chicken pox   . Complication of anesthesia 1998   Developed bigeminy post op  . Hyperlipidemia   . Hypertension   . PONV (postoperative nausea and vomiting)     Past Surgical History:  Procedure Laterality Date  . ABDOMINAL HYSTERECTOMY  1998   TVH--ovaries remain  . ABDOMINAL SACROCOLPOPEXY N/A 11/23/2014   Procedure: ABDOMINAL SACROCOLPOPEXY/HALBAN'S CULDOPLASTY;  Surgeon: Nunzio Cobbs, MD;  Location: [facility];  Service: Gynecology;  Laterality: N/A;  . ANTERIOR AND POSTERIOR REPAIR N/A 11/23/2014   Procedure: ANTERIOR (CYSTOCELE) ;  Surgeon: Nunzio Cobbs, MD;  Location: [facility];  Service: Gynecology;  Laterality: N/A;  . BLADDER SUSPENSION N/A 11/23/2014   Procedure: EXACT MIDURETERAL SLING;  Surgeon: Nunzio Cobbs, MD;  Location: [facility];  Service: Gynecology;  Laterality: N/A;  . CHOLECYSTECTOMY   2004  . CYSTOSCOPY N/A 11/23/2014   Procedure: CYSTOSCOPY;  Surgeon: Nunzio Cobbs, MD;  Location: [facility];  Service: Gynecology;  Laterality: N/A;  . LYSIS OF ADHESION N/A 11/23/2014   Procedure: LYSIS OF ADHESION;  Surgeon: Nunzio Cobbs, MD;  Location: [facility];  Service: Gynecology;  Laterality: N/A;  . SALPINGOOPHORECTOMY Bilateral 11/23/2014   Procedure: BILATERAL SALPINGO OOPHORECTOMY;  Surgeon: Nunzio Cobbs, MD;  Location: [facility];  Service: Gynecology;  Laterality: Bilateral;  . TUBAL LIGATION      Current Outpatient Prescriptions  Medication Sig Dispense Refill  . Calcium Carbonate-Vitamin D (CALCIUM-VITAMIN D) 600-200 MG-UNIT CAPS Take by mouth daily.      Marland Kitchen estradiol (ESTRACE) 0.1 MG/GM vaginal cream Use 1/2 g vaginally two times per week. 42.5 g 2  . estradiol (ESTRACE) 0.5 MG tablet Take 1 tablet (0.5 mg total) by mouth daily. 90 tablet 3  . lisinopril-hydrochlorothiazide (PRINZIDE,ZESTORETIC) 20-12.5 MG tablet Take 1 tablet by mouth daily. 90 tablet 3  . pneumococcal 23 valent vaccine (PNU-IMMUNE) 25 MCG/0.5ML injection Inject 0.5 mLs into the muscle tomorrow at 10 am. 2.5 mL 0  . pravastatin (PRAVACHOL) 40 MG tablet Take 1 tablet (40 mg total) by mouth daily. 90 tablet 3  . vitamin C (ASCORBIC ACID) 500 MG tablet Take 500 mg by mouth daily.       No current facility-administered medications for  this visit.     Family History  Problem Relation Age of Onset  . Stroke Father   . Alcohol abuse Father   . Alcohol abuse Other   . Arthritis Other   . Hypertension Other   . Stroke Other   . Breast cancer Maternal Grandmother 74  . Breast cancer Paternal Grandmother 41    ROS:  Pertinent items are noted in HPI.  Otherwise, a comprehensive ROS was negative.  Exam:   BP (!) 123/57 (BP Location: Right Arm, Patient Position: Sitting, Cuff Size: Normal)   Pulse 74   Resp 20   Ht 5\' 4"  (1.626 m)   Wt 131 lb 3.2 oz (59.5 kg)   LMP 05/14/1996    BMI 22.52 kg/m     General appearance: alert, cooperative and appears stated age Head: Normocephalic, without obvious abnormality, atraumatic Neck: no adenopathy, supple, symmetrical, trachea midline and thyroid normal to inspection and palpation Lungs: clear to auscultation bilaterally Breasts: normal appearance, no masses or tenderness, No nipple retraction or dimpling, No nipple discharge or bleeding, No axillary or supraclavicular adenopathy Heart: regular rate and rhythm Abdomen: Pfannenstiel incision, soft, non-tender; no masses, no organomegaly Extremities: extremities normal, atraumatic, no cyanosis or edema Skin: Skin color, texture, turgor normal. No rashes or lesions Lymph nodes: Cervical, supraclavicular, and axillary nodes normal. No abnormal inguinal nodes palpated Neurologic: Grossly normal  Pelvic: External genitalia:  no lesions              Urethra:  normal appearing urethra with no masses, tenderness or lesions              Bartholins and Skenes: normal                 Vagina: normal appearing vagina with normal color and discharge, no lesions.  First to second degree cystocele.              Cervix:  absent              Pap taken: No. Bimanual Exam:  Uterus:   Absent.              Adnexa: no mass, fullness, tenderness              Rectal exam: Yes.  .  Confirms.              Anus:  normal sphincter tone, no lesions  Chaperone was present for exam.  Assessment:   Well woman visit with normal exam. Osteopenia.  Status post prolapse repair.  Cystocele.  This is very small compared to the complete vaginal vault eversion she had preop.  On ERT.   Plan: Mammogram screening discussed. Recommended self breast awareness. Pap and HR HPV as above. Guidelines for Calcium, Vitamin D, regular exercise program including cardiovascular and weight bearing exercise. Colonoscopy due.  Oral estrogen through her PCP.  I discussed WHI study and potential risks of DVT, PE, and  stroke. Refill of Estrace vaginal estrogen cream.  Discussed potential breast effects.  Routine labs and BMD through PCP.  I discussed HIV and hep C testing to be done with her next blood work. Follow up annually and prn.       After visit summary provided.

## 2016-06-13 NOTE — Patient Instructions (Addendum)

## 2016-09-25 DIAGNOSIS — H04123 Dry eye syndrome of bilateral lacrimal glands: Secondary | ICD-10-CM | POA: Diagnosis not present

## 2016-09-25 DIAGNOSIS — Z961 Presence of intraocular lens: Secondary | ICD-10-CM | POA: Diagnosis not present

## 2017-01-31 ENCOUNTER — Encounter: Payer: Self-pay | Admitting: Family Medicine

## 2017-02-18 ENCOUNTER — Other Ambulatory Visit: Payer: Self-pay | Admitting: Obstetrics and Gynecology

## 2017-02-18 ENCOUNTER — Other Ambulatory Visit: Payer: Self-pay | Admitting: Family Medicine

## 2017-02-18 ENCOUNTER — Encounter: Payer: Self-pay | Admitting: Family Medicine

## 2017-02-18 ENCOUNTER — Ambulatory Visit (INDEPENDENT_AMBULATORY_CARE_PROVIDER_SITE_OTHER): Payer: Medicare Other | Admitting: Family Medicine

## 2017-02-18 VITALS — BP 96/60 | HR 84 | Temp 98.2°F | Resp 16 | Ht 64.0 in | Wt 129.0 lb

## 2017-02-18 DIAGNOSIS — Z0000 Encounter for general adult medical examination without abnormal findings: Secondary | ICD-10-CM | POA: Diagnosis not present

## 2017-02-18 DIAGNOSIS — Z23 Encounter for immunization: Secondary | ICD-10-CM

## 2017-02-18 DIAGNOSIS — Z1231 Encounter for screening mammogram for malignant neoplasm of breast: Secondary | ICD-10-CM

## 2017-02-18 LAB — BASIC METABOLIC PANEL
BUN: 17 mg/dL (ref 6–23)
CALCIUM: 9.4 mg/dL (ref 8.4–10.5)
CO2: 30 mEq/L (ref 19–32)
Chloride: 102 mEq/L (ref 96–112)
Creatinine, Ser: 0.74 mg/dL (ref 0.40–1.20)
GFR: 82.99 mL/min (ref >60.00)
GLUCOSE: 96 mg/dL (ref 70–99)
Potassium: 4.1 mEq/L (ref 3.5–5.1)
SODIUM: 139 meq/L (ref 135–145)

## 2017-02-18 LAB — HEPATIC FUNCTION PANEL
ALBUMIN: 4.2 g/dL (ref 3.5–5.2)
ALT: 19 U/L (ref 0–35)
AST: 14 U/L (ref 0–37)
Alkaline Phosphatase: 76 U/L (ref 39–117)
Bilirubin, Direct: 0.1 mg/dL (ref 0.0–0.3)
TOTAL PROTEIN: 7 g/dL (ref 6.0–8.3)
Total Bilirubin: 0.6 mg/dL (ref 0.2–1.2)

## 2017-02-18 LAB — CBC WITH DIFFERENTIAL/PLATELET
BASOS PCT: 0.4 % (ref 0.0–3.0)
Basophils Absolute: 0 10*3/uL (ref 0.0–0.1)
EOS PCT: 1 % (ref 0.0–5.0)
Eosinophils Absolute: 0.1 10*3/uL (ref 0.0–0.7)
HEMATOCRIT: 38.7 % (ref 36.0–46.0)
HEMOGLOBIN: 12.9 g/dL (ref 12.0–15.0)
LYMPHS PCT: 40.8 % (ref 12.0–46.0)
Lymphs Abs: 2.1 10*3/uL (ref 0.7–4.0)
MCHC: 33.3 g/dL (ref 30.0–36.0)
MCV: 94.7 fl (ref 78.0–100.0)
MONO ABS: 0.4 10*3/uL (ref 0.1–1.0)
MONOS PCT: 7.3 % (ref 3.0–12.0)
Neutro Abs: 2.6 10*3/uL (ref 1.4–7.7)
Neutrophils Relative %: 50.5 % (ref 43.0–77.0)
Platelets: 269 10*3/uL (ref 150.0–400.0)
RBC: 4.09 Mil/uL (ref 3.87–5.11)
RDW: 12.7 % (ref 11.5–15.5)
WBC: 5.2 10*3/uL (ref 4.0–10.5)

## 2017-02-18 LAB — LIPID PANEL
Cholesterol: 232 mg/dL — ABNORMAL HIGH (ref 0–200)
HDL: 46.2 mg/dL (ref >39.00)
NonHDL: 185.44
Total CHOL/HDL Ratio: 5
Triglycerides: 352 mg/dL — ABNORMAL HIGH (ref 0.0–149.0)
VLDL: 70.4 mg/dL — ABNORMAL HIGH (ref 0.0–40.0)

## 2017-02-18 LAB — TSH: TSH: 3.85 u[IU]/mL (ref 0.35–4.50)

## 2017-02-18 LAB — LDL CHOLESTEROL, DIRECT: LDL DIRECT: 137 mg/dL

## 2017-02-18 MED ORDER — LISINOPRIL 20 MG PO TABS: 20.0000 mg | ORAL_TABLET | Freq: Every day | ORAL | 3 refills | Status: DC

## 2017-02-18 MED ORDER — ESTRADIOL 0.5 MG PO TABS: 0.5000 mg | ORAL_TABLET | Freq: Every day | ORAL | 3 refills | Status: DC

## 2017-02-18 NOTE — Progress Notes (Addendum)
Subjective:     Patient ID: Bridget Strong, female   DOB: [birth year], 66 y.o.   MRN: [id number]  HPI Patient seen for physical exam. She is still followed by gynecologist yearly. She is on Estrace 0.5 mg once daily. Needs refills. She's had previous shingles vaccine but like to look at new shingles vaccine. She is due for repeat colonoscopy but she has decided she would rather do cologuard. She also needs flu vaccine. She's had previous pneumonia vaccinations. She has regular weightbearing exercise. Takes daily calcium and vitamin D. No falls. No depression issues.  Past Medical History:  Diagnosis Date  . Anemia    with pregnancy  . Broken humerus 10-24-13  . Chicken pox   . Complication of anesthesia 1998   Developed bigeminy post op  . Hyperlipidemia   . Hypertension   . PONV (postoperative nausea and vomiting)    Past Surgical History:  Procedure Laterality Date  . ABDOMINAL HYSTERECTOMY  1998   TVH--ovaries remain  . ABDOMINAL SACROCOLPOPEXY N/A 11/23/2014   Procedure: ABDOMINAL SACROCOLPOPEXY/HALBAN'S CULDOPLASTY;  Surgeon: Nunzio Cobbs, MD;  Location: [facility];  Service: Gynecology;  Laterality: N/A;  . ANTERIOR AND POSTERIOR REPAIR N/A 11/23/2014   Procedure: ANTERIOR (CYSTOCELE) ;  Surgeon: Nunzio Cobbs, MD;  Location: [facility];  Service: Gynecology;  Laterality: N/A;  . BLADDER SUSPENSION N/A 11/23/2014   Procedure: EXACT MIDURETERAL SLING;  Surgeon: Nunzio Cobbs, MD;  Location: [facility];  Service: Gynecology;  Laterality: N/A;  . CHOLECYSTECTOMY  2004  . CYSTOSCOPY N/A 11/23/2014   Procedure: CYSTOSCOPY;  Surgeon: Nunzio Cobbs, MD;  Location: [facility];  Service: Gynecology;  Laterality: N/A;  . LYSIS OF ADHESION N/A 11/23/2014   Procedure: LYSIS OF ADHESION;  Surgeon: Nunzio Cobbs, MD;  Location: [facility];  Service: Gynecology;  Laterality: N/A;  . SALPINGOOPHORECTOMY Bilateral 11/23/2014   Procedure: BILATERAL SALPINGO  OOPHORECTOMY;  Surgeon: Nunzio Cobbs, MD;  Location: [facility];  Service: Gynecology;  Laterality: Bilateral;  . TUBAL LIGATION      reports that she has never smoked. She has never used smokeless tobacco. She reports that she does not drink alcohol or use drugs. family history includes Alcohol abuse in her father and other; Arthritis in her other; Breast cancer (age of onset: 9) in her paternal grandmother; Breast cancer (age of onset: 65) in her maternal grandmother; Hypertension in her other; Stroke in her father and other. Allergies  Allergen Reactions  . Diflucan [Fluconazole] Hives, Itching and Swelling  . Hydrocodone     Nausea, GI upset  . Tramadol Other (See Comments)    Makes her sick and feel weird   The 10-year ASCVD risk score Mikey Bussing DC Jr., et al., 2013) is: 5.9%   Values used to calculate the score:     Age: 26 years     Sex: Female     Is Non-Hispanic African American: No     Diabetic: No     Tobacco smoker: No     Systolic Blood Pressure: 96 mmHg     Is BP treated: Yes     HDL Cholesterol: 46.2 mg/dL     Total Cholesterol: 232 mg/dL   Review of Systems  Constitutional: Negative for activity change, appetite change, fatigue, fever and unexpected weight change.  HENT: Negative for ear pain, hearing loss, sore throat and trouble swallowing.   Eyes: Negative for visual disturbance.  Respiratory: Negative for cough and shortness of breath.   Cardiovascular: Negative for chest pain and palpitations.  Gastrointestinal: Negative for abdominal pain, blood in stool, constipation and diarrhea.  Genitourinary: Negative for dysuria and hematuria.  Musculoskeletal: Negative for arthralgias, back pain and myalgias.  Skin: Negative for rash.  Neurological: Negative for dizziness, syncope and headaches.  Hematological: Negative for adenopathy.  Psychiatric/Behavioral: Negative for confusion and dysphoric mood.       Objective:   Physical Exam  Constitutional: She  is oriented to person, place, and time. She appears well-developed and well-nourished.  HENT:  Head: Normocephalic and atraumatic.  Eyes: Pupils are equal, round, and reactive to light. EOM are normal.  Neck: Normal range of motion. Neck supple. No thyromegaly present.  Cardiovascular: Normal rate, regular rhythm and normal heart sounds.   No murmur heard. Pulmonary/Chest: Breath sounds normal. No respiratory distress. She has no wheezes. She has no rales.  Abdominal: Soft. Bowel sounds are normal. She exhibits no distension and no mass. There is no tenderness. There is no rebound and no guarding.  Genitourinary:  Genitourinary Comments: Per gyn  Musculoskeletal: Normal range of motion. She exhibits no edema.  Lymphadenopathy:    She has no cervical adenopathy.  Neurological: She is alert and oriented to person, place, and time. She displays normal reflexes. No cranial nerve deficit.  Skin: No rash noted.  Psychiatric: She has a normal mood and affect. Her behavior is normal. Judgment and thought content normal.       Assessment:     Physical exam. Several issues addressed as below    Plan:     -Flu vaccine given -Written prescription for new shingles vaccine which she will consider getting through pharmacy -Needs follow-up colon cancer screening. She prefers cologuard after discussion of risk and benefits (no past hx of polyps) -Screening labs obtained -Continue regular weightbearing exercise. -She will continue getting mammograms through her [facility]

## 2017-02-18 NOTE — Patient Instructions (Signed)
Stop the Lisinopril HCTZ and start plain Lisinopril.   Consider new shingles vaccine.

## 2017-03-06 DIAGNOSIS — Z1211 Encounter for screening for malignant neoplasm of colon: Secondary | ICD-10-CM | POA: Diagnosis not present

## 2017-03-06 DIAGNOSIS — Z1212 Encounter for screening for malignant neoplasm of rectum: Secondary | ICD-10-CM | POA: Diagnosis not present

## 2017-03-14 NOTE — Addendum Note (Signed)
Addended by: Gordy Councilman on: 03/14/2017 09:29 AM   Modules accepted: Orders

## 2017-03-18 LAB — COLOGUARD: Cologuard: NEGATIVE

## 2017-03-19 ENCOUNTER — Encounter: Payer: Self-pay | Admitting: Family Medicine

## 2017-03-19 ENCOUNTER — Encounter: Payer: Self-pay | Admitting: *Deleted

## 2017-05-14 DIAGNOSIS — C50919 Malignant neoplasm of unspecified site of unspecified female breast: Secondary | ICD-10-CM

## 2017-05-14 DIAGNOSIS — Z923 Personal history of irradiation: Secondary | ICD-10-CM

## 2017-05-14 HISTORY — PX: BREAST LUMPECTOMY: SHX2

## 2017-05-14 HISTORY — DX: Malignant neoplasm of unspecified site of unspecified female breast: C50.919

## 2017-05-14 HISTORY — DX: Personal history of irradiation: Z92.3

## 2017-05-23 ENCOUNTER — Ambulatory Visit
Admission: RE | Admit: 2017-05-23 | Discharge: 2017-05-23 | Disposition: A | Discharge status: Home / Self Care | Payer: Medicare Other | Source: Ambulatory Visit | Attending: Obstetrics and Gynecology | Admitting: Obstetrics and Gynecology

## 2017-05-23 DIAGNOSIS — Z1231 Encounter for screening mammogram for malignant neoplasm of breast: Secondary | ICD-10-CM

## 2017-05-24 ENCOUNTER — Other Ambulatory Visit: Payer: Self-pay | Admitting: Obstetrics and Gynecology

## 2017-05-24 DIAGNOSIS — R928 Other abnormal and inconclusive findings on diagnostic imaging of breast: Secondary | ICD-10-CM

## 2017-05-28 ENCOUNTER — Other Ambulatory Visit: Payer: Self-pay | Admitting: Obstetrics and Gynecology

## 2017-05-28 ENCOUNTER — Ambulatory Visit
Admission: RE | Admit: 2017-05-28 | Discharge: 2017-05-28 | Disposition: A | Discharge status: Home / Self Care | Payer: Medicare Other | Source: Ambulatory Visit | Attending: Obstetrics and Gynecology | Admitting: Obstetrics and Gynecology

## 2017-05-28 DIAGNOSIS — R928 Other abnormal and inconclusive findings on diagnostic imaging of breast: Secondary | ICD-10-CM | POA: Diagnosis not present

## 2017-05-28 DIAGNOSIS — N6459 Other signs and symptoms in breast: Secondary | ICD-10-CM

## 2017-05-28 DIAGNOSIS — R921 Mammographic calcification found on diagnostic imaging of breast: Secondary | ICD-10-CM

## 2017-05-28 DIAGNOSIS — N6489 Other specified disorders of breast: Secondary | ICD-10-CM | POA: Diagnosis not present

## 2017-05-30 ENCOUNTER — Telehealth: Payer: Self-pay | Admitting: Obstetrics and Gynecology

## 2017-05-30 DIAGNOSIS — N6459 Other signs and symptoms in breast: Secondary | ICD-10-CM

## 2017-05-30 DIAGNOSIS — R921 Mammographic calcification found on diagnostic imaging of breast: Secondary | ICD-10-CM

## 2017-05-30 NOTE — Telephone Encounter (Signed)
Please get her scheduled for the left breast MRI as recommended

## 2017-05-30 NOTE — Telephone Encounter (Signed)
Patient says she need a MRI.

## 2017-05-30 NOTE — Telephone Encounter (Signed)
Order placed, routing to [facility] for precert.

## 2017-05-30 NOTE — Telephone Encounter (Signed)
Spoke with patient. Screening MMG on 05/28/17, was advised MRI recommended on Left breast f/u with biopsy. Advised patient Dr. Quincy Simmonds is on LOA, will have covering provider review results and order MRI. Advised patient MRI will have to be  precerted prior to scheduling, will return call to schedule. Patient verbalizes understanding and is agreeable.   Dr. Talbert Nan -please review 05/28/17 MMG results, ok to proceed with MRI left breast with and without contrast?

## 2017-05-31 NOTE — Telephone Encounter (Signed)
Spoke with patient. Patient is very anxious about scheduling MRI. She is scheduled for a biopsy on 06/20/2017 and MRI has to be completed before that appointment. Advised will call [facility] to assist with scheduling and return call with appointment date and time. Patient is agreeable. Aware this will require PA with insurance company and that this will be completed as soon as possible. Will have to be done before patient's appointment.  Spoke with [employer]. Unable to schedule appointment as they want to speak with the patient directly. They will call patient at this time to schedule.  Call to patient to advise [facility] will be calling her to schedule her appointment at this time. Patient is agreeable and will await call.

## 2017-05-31 NOTE — Telephone Encounter (Signed)
[facility] contacted patient. Patient is scheduled for bilateral breast MRI w wo contrast on 06/03/2017 at 2:55 pm at [place]. Placed in imaging hold.  Routing to [employer] as this will need PA before patient's appointment.

## 2017-06-03 ENCOUNTER — Ambulatory Visit
Admission: RE | Admit: 2017-06-03 | Discharge: 2017-06-03 | Disposition: A | Discharge status: Home / Self Care | Payer: Medicare Other | Source: Ambulatory Visit | Attending: Obstetrics and Gynecology | Admitting: Obstetrics and Gynecology

## 2017-06-03 DIAGNOSIS — N6459 Other signs and symptoms in breast: Secondary | ICD-10-CM

## 2017-06-03 DIAGNOSIS — R921 Mammographic calcification found on diagnostic imaging of breast: Secondary | ICD-10-CM

## 2017-06-03 DIAGNOSIS — N6489 Other specified disorders of breast: Secondary | ICD-10-CM | POA: Diagnosis not present

## 2017-06-03 MED ORDER — GADOBENATE DIMEGLUMINE 529 MG/ML IV SOLN
12.0000 mL | Freq: Once | INTRAVENOUS | Status: AC | PRN
  Administered 2017-06-03: 12 mL via INTRAVENOUS

## 2017-06-03 NOTE — Telephone Encounter (Signed)
Per phone call to patients insurance plan, [employer], the MRI code [phone] for a bilateral breast MRI does not required prior approval. Insurance call reference number is [id number] with representative Sarah. Ok to close encounter.   cc: Reesa Chew, RN

## 2017-06-04 ENCOUNTER — Encounter: Payer: Self-pay | Admitting: Family Medicine

## 2017-06-07 ENCOUNTER — Other Ambulatory Visit: Payer: Self-pay | Admitting: Obstetrics and Gynecology

## 2017-06-07 ENCOUNTER — Telehealth: Payer: Self-pay | Admitting: Obstetrics and Gynecology

## 2017-06-07 DIAGNOSIS — N6459 Other signs and symptoms in breast: Secondary | ICD-10-CM

## 2017-06-07 DIAGNOSIS — R921 Mammographic calcification found on diagnostic imaging of breast: Secondary | ICD-10-CM

## 2017-06-07 NOTE — Telephone Encounter (Signed)
Patient states that [facility] needs an order for a core needle biopsy of left breast faxed to them

## 2017-06-07 NOTE — Telephone Encounter (Signed)
Spoke with patient. States she had breast MRI on 06/03/17, MR guided core needle biopsy of the left breast to be preformed prior to stereotactic biopsy on 2/7 at 10:30am. Will need order to schedule. Advised patient RN will place order and return call to [facility] for scheduling. Patient is agreeable.   Spoke with Cherish at [facility]. Patient scheduled for MR guided core needle biopsy of left breast on 06/17/17 at 7am, [address] location. Eat light meal prior.   Order placed for MR guided left breast biopsy.   Call returned to patient, advised of appointment details as seen above. Patient verbalizes understanding and is agreeable.   Routing to covering provider, Dr. Talbert Nan,  for final review. Patient is agreeable to disposition. Will close encounter.  Cc: Lerry Liner

## 2017-06-07 NOTE — Telephone Encounter (Signed)
Spoke with Bridget Strong at [facility]. Was advised no additional orders needed for left breast biopsy, order in Epic cosigned by Dr. Talbert Nan on 06/02/17 and [facility]gent Form faxed, signed and received.

## 2017-06-14 ENCOUNTER — Ambulatory Visit (INDEPENDENT_AMBULATORY_CARE_PROVIDER_SITE_OTHER): Payer: Medicare Other | Admitting: Obstetrics and Gynecology

## 2017-06-14 ENCOUNTER — Other Ambulatory Visit: Payer: Self-pay

## 2017-06-14 ENCOUNTER — Encounter: Payer: Self-pay | Admitting: Obstetrics and Gynecology

## 2017-06-14 ENCOUNTER — Other Ambulatory Visit (HOSPITAL_COMMUNITY)
Admission: RE | Admit: 2017-06-14 | Discharge: 2017-06-14 | Disposition: A | Discharge status: Home / Self Care | Payer: Medicare Other | Source: Ambulatory Visit | Attending: Obstetrics and Gynecology | Admitting: Obstetrics and Gynecology

## 2017-06-14 VITALS — BP 138/70 | HR 72 | Resp 14 | Ht 64.0 in | Wt 130.0 lb

## 2017-06-14 DIAGNOSIS — Z01419 Encounter for gynecological examination (general) (routine) without abnormal findings: Secondary | ICD-10-CM | POA: Diagnosis not present

## 2017-06-14 NOTE — Addendum Note (Signed)
Addended by: Yisroel Ramming, Dietrich Pates E on: 06/14/2017 01:43 PM   Modules accepted: Orders

## 2017-06-14 NOTE — Patient Instructions (Signed)

## 2017-06-14 NOTE — Progress Notes (Addendum)
67 y.o. G27P3013 Married Caucasian female here for annual exam.    No loss of control of urine or bowel function.  No pressure in the pelvis.  Having 2 left breast biopsies.  Possible left breast malignancy.   Still on oral and vaginal estrogens.  PCP:  Dr. Carolann Littler   Patient's last menstrual period was 05/14/1996.           Sexually active: Yes.    The current method of family planning is status post hysterectomy.    Exercising: Yes.    stationary bike, weight machine Smoker:  no  Health Maintenance: Pap:  04/23/12 Negative History of abnormal Pap:  no MMG:  06/03/17 BIRADS 5: Highly suggestive of malignancy -- see Epic for details Colonoscopy:  Oct. 2018 Cologuard -- Normal per patient BMD:   02-22-15  Result:Osteopenia:Sylvarena TDaP:  02/21/10 Td Gardasil:   n/a Hep C: 02/09/16 negative Screening Labs:  PCP   reports that  has never smoked. she has never used smokeless tobacco. She reports that she does not drink alcohol or use drugs.  Past Medical History:  Diagnosis Date  . Anemia    with pregnancy  . Broken humerus 10-24-13  . Chicken pox   . Complication of anesthesia 1998   Developed bigeminy post op  . Hyperlipidemia   . Hypertension   . PONV (postoperative nausea and vomiting)     Past Surgical History:  Procedure Laterality Date  . ABDOMINAL HYSTERECTOMY  1998   TVH--ovaries remain  . ABDOMINAL SACROCOLPOPEXY N/A 11/23/2014   Procedure: ABDOMINAL SACROCOLPOPEXY/HALBAN'S CULDOPLASTY;  Surgeon: Nunzio Cobbs, MD;  Location: [facility];  Service: Gynecology;  Laterality: N/A;  . ANTERIOR AND POSTERIOR REPAIR N/A 11/23/2014   Procedure: ANTERIOR (CYSTOCELE) ;  Surgeon: Nunzio Cobbs, MD;  Location: [facility];  Service: Gynecology;  Laterality: N/A;  . BLADDER SUSPENSION N/A 11/23/2014   Procedure: EXACT MIDURETERAL SLING;  Surgeon: Nunzio Cobbs, MD;  Location: [facility];  Service: Gynecology;  Laterality: N/A;  . CHOLECYSTECTOMY   2004  . CYSTOSCOPY N/A 11/23/2014   Procedure: CYSTOSCOPY;  Surgeon: Nunzio Cobbs, MD;  Location: [facility];  Service: Gynecology;  Laterality: N/A;  . LYSIS OF ADHESION N/A 11/23/2014   Procedure: LYSIS OF ADHESION;  Surgeon: Nunzio Cobbs, MD;  Location: [facility];  Service: Gynecology;  Laterality: N/A;  . SALPINGOOPHORECTOMY Bilateral 11/23/2014   Procedure: BILATERAL SALPINGO OOPHORECTOMY;  Surgeon: Nunzio Cobbs, MD;  Location: [facility];  Service: Gynecology;  Laterality: Bilateral;  . TUBAL LIGATION      Current Outpatient Medications  Medication Sig Dispense Refill  . Calcium Carbonate-Vitamin D (CALCIUM-VITAMIN D) 600-200 MG-UNIT CAPS Take by mouth daily.      Marland Kitchen estradiol (ESTRACE) 0.1 MG/GM vaginal cream Use 1/2 g vaginally two times per week. 42.5 g 2  . estradiol (ESTRACE) 0.5 MG tablet Take 1 tablet (0.5 mg total) by mouth daily. 90 tablet 3  . lisinopril (PRINIVIL,ZESTRIL) 20 MG tablet Take 1 tablet (20 mg total) by mouth daily. 90 tablet 3  . pravastatin (PRAVACHOL) 40 MG tablet TAKE 1 TABLET BY MOUTH DAILY 90 tablet 3  . vitamin C (ASCORBIC ACID) 500 MG tablet Take 500 mg by mouth daily.      . pneumococcal 23 valent vaccine (PNU-IMMUNE) 25 MCG/0.5ML injection Inject 0.5 mLs into the muscle tomorrow at 10 am. (Patient not taking: Reported on 02/18/2017) 2.5 mL 0  No current facility-administered medications for this visit.     Family History  Problem Relation Age of Onset  . Stroke Father   . Alcohol abuse Father   . Alcohol abuse Other   . Arthritis Other   . Hypertension Other   . Stroke Other   . Breast cancer Maternal Grandmother 40  . Breast cancer Paternal Grandmother 38    ROS:  Pertinent items are noted in HPI.  Otherwise, a comprehensive ROS was negative.  Exam:   BP 138/70 (BP Location: Right Arm, Patient Position: Sitting, Cuff Size: Normal)   Pulse 72   Resp 14   Ht 5\' 4"  (1.626 m)   Wt 130 lb (59 kg)   LMP 05/14/1996    BMI 22.31 kg/m     General appearance: alert, cooperative and appears stated age Head: Normocephalic, without obvious abnormality, atraumatic Neck: no adenopathy, supple, symmetrical, trachea midline and thyroid normal to inspection and palpation Lungs: clear to auscultation bilaterally Breasts: right - normal appearance, no masses or tenderness, No nipple retraction or dimpling, No nipple discharge or bleeding, No axillary or supraclavicular adenopathy.  Left breast with nipple inversion.  No masses, retractions, nipple discharge or axillary nodes palpable. Heart: regular rate and rhythm Abdomen: soft, non-tender; no masses, no organomegaly Extremities: extremities normal, atraumatic, no cyanosis or edema Skin: Skin color, texture, turgor normal. No rashes or lesions Lymph nodes: Cervical, supraclavicular, and axillary nodes normal. No abnormal inguinal nodes palpated Neurologic: Grossly normal  Pelvic: External genitalia:  no lesions              Urethra:  normal appearing urethra with no masses, tenderness or lesions              Bartholins and Skenes: normal                 Vagina: slightly thickened vaginal mucosa below the urethra.  Almost second degree cystocele.  Excellent vaginal cuff support and posterior vaginal support.               Cervix:  Absent.               Pap taken: Yes.  of the anterior vaginal mucosa below the urethra. Bimanual Exam:  Uterus:   Absent.               Adnexa: no mass, fullness, tenderness              Rectal exam: Yes.  .  Confirms.              Anus:  normal sphincter tone, no lesions  Chaperone was present for exam.  Assessment:   Well woman visit. Recurrent cystocele.  Status post hysterectomy with sacrocolpopexy.   Vaginal mucosal thickening below the urethra.  Scar form surgery? Abnormal mammogram of left breast.  Biopsies pending.   Plan: Mammogram screening discussed. Recommended self breast awareness. Pap and HR HPV as  above. Guidelines for Calcium, Vitamin D, regular exercise program including cardiovascular and weight bearing exercise. Stop all estrogens and herbal estrogen txs.  FU biopsy results. Labs and BMD through PCP.  Follow up annually and prn.   After visit summary provided.

## 2017-06-17 ENCOUNTER — Ambulatory Visit: Payer: Medicare Other

## 2017-06-17 ENCOUNTER — Ambulatory Visit
Admission: RE | Admit: 2017-06-17 | Discharge: 2017-06-17 | Disposition: A | Discharge status: Home / Self Care | Payer: Medicare Other | Source: Ambulatory Visit | Attending: Obstetrics and Gynecology | Admitting: Obstetrics and Gynecology

## 2017-06-17 DIAGNOSIS — R921 Mammographic calcification found on diagnostic imaging of breast: Secondary | ICD-10-CM

## 2017-06-17 DIAGNOSIS — N6459 Other signs and symptoms in breast: Secondary | ICD-10-CM

## 2017-06-17 DIAGNOSIS — N6323 Unspecified lump in the left breast, lower outer quadrant: Secondary | ICD-10-CM | POA: Diagnosis not present

## 2017-06-17 DIAGNOSIS — N6489 Other specified disorders of breast: Secondary | ICD-10-CM | POA: Diagnosis not present

## 2017-06-17 DIAGNOSIS — D0512 Intraductal carcinoma in situ of left breast: Secondary | ICD-10-CM | POA: Diagnosis not present

## 2017-06-17 MED ORDER — GADOBENATE DIMEGLUMINE 529 MG/ML IV SOLN
9.0000 mL | Freq: Once | INTRAVENOUS | Status: AC | PRN
  Administered 2017-06-17: 9 mL via INTRAVENOUS

## 2017-06-18 LAB — CYTOLOGY - PAP: Diagnosis: NEGATIVE

## 2017-06-19 ENCOUNTER — Encounter: Payer: Self-pay | Admitting: *Deleted

## 2017-06-19 ENCOUNTER — Telehealth: Payer: Self-pay | Admitting: Hematology

## 2017-06-19 DIAGNOSIS — D0512 Intraductal carcinoma in situ of left breast: Secondary | ICD-10-CM | POA: Insufficient documentation

## 2017-06-19 NOTE — Telephone Encounter (Signed)
Spoke with patient to confirm afternoon [facility] appointment for 06/26/17, packet will be given by Sula Soda @ [facility]

## 2017-06-20 ENCOUNTER — Ambulatory Visit
Admission: RE | Admit: 2017-06-20 | Discharge: 2017-06-20 | Disposition: A | Discharge status: Home / Self Care | Payer: Medicare Other | Source: Ambulatory Visit | Attending: Obstetrics and Gynecology | Admitting: Obstetrics and Gynecology

## 2017-06-20 ENCOUNTER — Other Ambulatory Visit: Payer: Self-pay | Admitting: Obstetrics and Gynecology

## 2017-06-20 DIAGNOSIS — R921 Mammographic calcification found on diagnostic imaging of breast: Secondary | ICD-10-CM

## 2017-06-20 DIAGNOSIS — N6012 Diffuse cystic mastopathy of left breast: Secondary | ICD-10-CM | POA: Diagnosis not present

## 2017-06-25 NOTE — Progress Notes (Signed)
- [place]  [phone] [phone]  Clinic New Consult Note   Patient Care Team: Eulas Post, MD as PCP - General (Family Medicine) 06/26/2017  CHIEF COMPLAINTS/PURPOSE OF CONSULTATION:  Cancer Staging Ductal carcinoma in situ (DCIS) of left breast Staging form: Breast, AJCC 8th Edition - Clinical stage from 06/17/2017: Stage 0 (cTis (DCIS), cN0, cM0, ER: Negative, PR: Negative, HER2: Not assessed ) - Signed by Truitt Merle, MD on 06/25/2017  SUMMARY OF ONCOLOGIC HISTORY:   Oncology History   Cancer Staging Ductal carcinoma in situ (DCIS) of left breast Staging form: Breast, AJCC 8th Edition - Clinical stage from 06/17/2017: Stage 0 (cTis (DCIS), cN0, cM0, ER: Negative, PR: Negative, HER2: Not assessed ) - Signed by Truitt Merle, MD on 06/25/2017      Ductal carcinoma in situ (DCIS) of left breast   05/28/2017 Mammogram    Uilateral left diagnostic mammography with tomography and left breast ultrasonography IMPRESSION: New left nipple inversion for 1 month. 3 cm of linear left breast calcifications.      06/03/2017 Imaging    MR Breast Bilateral IMPRESSION: Clumped linear enhancement in the central left breast centered in the 5 to 6:00 position with a linear extension to the inverted left nipple. Combined, this area measures 6.1 x 2.0 x 1.5 cm in maximum dimensions. This is highly suspicious for ductal carcinoma in situ. No evidence of malignancy on the right.       06/17/2017 Pathology Results    Left needle core biopsy: Breast, left, needle core biopsy, central lower with high grade ductal carcinoma in situ with necrosis. Prognostic indicators significant for: ER, 0% negative and PR, 0% negative.      06/19/2017 Initial Diagnosis    Ductal carcinoma in situ (DCIS) of left breast      06/20/2017 Pathology Results    Left needle core biopsy: Breast, left, needle core biopsy, outer anterior with benign breast tissue with focal chronic inflammation  and features suggestive of granuloma. Vessels with calcifications.        HISTORY OF PRESENTING ILLNESS:  Bridget Strong 67 y.o. female is here because of newly diagnosed left breast cancer. She is accompanied by her husband today. She initially presented for her routine screening mammogram. She reports that she did notice her nipple being inverted approximately early December 2018 and she denies finding a lump prior to biopsy. She is retired from office work.   She had routine screening mammography on 05/23/2017 at [facility] with results showing: IMPRESSION: Further evaluation is suggested for calcifications and apparent left inversion in the left breast. She underwent unilateral left diagnostic mammography with tomography and left breast ultrasonography at [facility] on 05/28/2017 with results showing: IMPRESSION: New left nipple inversion for 1 month. 3 cm of linear left breast calcifications. She then proceeded to a MR Breast Bilateral on 06/03/2017 with results showing: IMPRESSION: Clumped linear enhancement in the central left breast centered in the 5 to 6:00 position with a linear extension to the inverted left nipple. Combined, this area measures 6.1 x 2.0 x 1.5 cm in maximum dimensions. This is highly suspicious for ductal carcinoma in situ. No evidence of malignancy on the right.  Accordingly on 06/17/2017 she proceeded to left needle core biopsy with pathology showing: Breast, left, needle core biopsy, central lower with high grade ductal carcinoma in situ with necrosis. Prognostic indicators significant for: ER, 0% negative and PR, 0% negative. On 06/20/2017, she had a left needle  core biopsy with pathology showing: Breast, left, needle core biopsy, outer anterior with benign breast tissue with focal chronic inflammation and features suggestive of granuloma. Vessels with calcifications.   She had a PMHx of HTN and anemia during pregnancies. She denies any other PMHx today. She has a  family hx of maternal grandmother at age 49 and paternal grandmother both with breast cancer. Her brother had bladder cancer twice. She had a tubal ligation, hysterectomy. She has 3 boys and 1 granddaughter. She was taking HRT due to hot flashes and she completed this 2 weeks ago and hasn't had hot flashes since.   On review of systems, pt reports bruising to the biopsy site. She denies appetite change, weight loss, decreased energy, and any other symptoms. She goes to the gym twice a walk and reports that she will start walking more with her husband. She reports that she has a second story home which she goes up and down the stairs all the time.  GYN HISTORY  Menarchal: xx  LMP: 1998 following hysterectomy Contraceptive:  HRT: Yes pill and cream, until 2 weeks ago.  GP: G4P3     MEDICAL HISTORY:  Past Medical History:  Diagnosis Date  . Broken humerus 10-24-13  . Chicken pox   . Complication of anesthesia 1998   Developed bigeminy post op  . Hyperlipidemia   . Hypertension   . PONV (postoperative nausea and vomiting)     SURGICAL HISTORY: Past Surgical History:  Procedure Laterality Date  . ABDOMINAL HYSTERECTOMY  1998   TVH--ovaries remain  . ABDOMINAL SACROCOLPOPEXY N/A 11/23/2014   Procedure: ABDOMINAL SACROCOLPOPEXY/HALBAN'S CULDOPLASTY;  Surgeon: Nunzio Cobbs, MD;  Location: [facility];  Service: Gynecology;  Laterality: N/A;  . ANTERIOR AND POSTERIOR REPAIR N/A 11/23/2014   Procedure: ANTERIOR (CYSTOCELE) ;  Surgeon: Nunzio Cobbs, MD;  Location: [facility];  Service: Gynecology;  Laterality: N/A;  . BLADDER SUSPENSION N/A 11/23/2014   Procedure: EXACT MIDURETERAL SLING;  Surgeon: Nunzio Cobbs, MD;  Location: [facility];  Service: Gynecology;  Laterality: N/A;  . CHOLECYSTECTOMY  2004  . CYSTOSCOPY N/A 11/23/2014   Procedure: CYSTOSCOPY;  Surgeon: Nunzio Cobbs, MD;  Location: [facility];  Service: Gynecology;  Laterality: N/A;  . LYSIS OF  ADHESION N/A 11/23/2014   Procedure: LYSIS OF ADHESION;  Surgeon: Nunzio Cobbs, MD;  Location: [facility];  Service: Gynecology;  Laterality: N/A;  . SALPINGOOPHORECTOMY Bilateral 11/23/2014   Procedure: BILATERAL SALPINGO OOPHORECTOMY;  Surgeon: Nunzio Cobbs, MD;  Location: [facility];  Service: Gynecology;  Laterality: Bilateral;  . TUBAL LIGATION      SOCIAL HISTORY: Social History   Socioeconomic History  . Marital status: Married    Spouse name: Not on file  . Number of children: Not on file  . Years of education: Not on file  . Highest education level: Not on file  Social Needs  . Financial resource strain: Not on file  . Food insecurity - worry: Not on file  . Food insecurity - inability: Not on file  . Transportation needs - medical: Not on file  . Transportation needs - non-medical: Not on file  Occupational History  . Not on file  Tobacco Use  . Smoking status: Never Smoker  . Smokeless tobacco: Never Used  Substance and Sexual Activity  . Alcohol use: No    Alcohol/week: 0.0 oz  . Drug use: No  . Sexual  activity: Yes    Partners: Male    Birth control/protection: Surgical    Comment: TVH-ovaries remain  Other Topics Concern  . Not on file  Social History Narrative  . Not on file    FAMILY HISTORY: Family History  Problem Relation Age of Onset  . Stroke Father   . Alcohol abuse Father   . Cancer Brother        bladder cancer  . Alcohol abuse Other   . Arthritis Other   . Hypertension Other   . Stroke Other   . Breast cancer Maternal Grandmother 49  . Breast cancer Paternal Grandmother 93    ALLERGIES:  is allergic to diflucan [fluconazole]; hydrocodone; and tramadol.  MEDICATIONS:  Current Outpatient Medications  Medication Sig Dispense Refill  . Calcium Carbonate-Vitamin D (CALCIUM-VITAMIN D) 600-200 MG-UNIT CAPS Take by mouth daily.      Marland Kitchen lisinopril (PRINIVIL,ZESTRIL) 20 MG tablet Take 1 tablet (20 mg total) by mouth daily. 90  tablet 3  . pneumococcal 23 valent vaccine (PNU-IMMUNE) 25 MCG/0.5ML injection Inject 0.5 mLs into the muscle tomorrow at 10 am. 2.5 mL 0  . pravastatin (PRAVACHOL) 40 MG tablet TAKE 1 TABLET BY MOUTH DAILY 90 tablet 3  . vitamin C (ASCORBIC ACID) 500 MG tablet Take 500 mg by mouth daily.       No current facility-administered medications for this visit.     REVIEW OF SYSTEMS:   Constitutional: Denies fevers, chills or abnormal night sweats Eyes: Denies blurriness of vision, double vision or watery eyes Ears, nose, mouth, throat, and face: Denies mucositis or sore throat Respiratory: Denies cough, dyspnea or wheezes Cardiovascular: Denies palpitation, chest discomfort or lower extremity swelling Gastrointestinal:  Denies nausea, heartburn or change in bowel habits Skin: Denies abnormal skin rashes Lymphatics: Denies new lymphadenopathy or easy bruising Neurological:Denies numbness, tingling or new weaknesses Behavioral/Psych: Mood is stable, no new changes  All other systems were reviewed with the patient and are negative.  PHYSICAL EXAMINATION:  ECOG PERFORMANCE STATUS: 0 - Asymptomatic  Vitals:   06/26/17 1238  BP: (!) 152/56  Pulse: 73  Resp: 17  Temp: 97.8 F (36.6 C)  SpO2: 100%   Filed Weights   06/26/17 1238  Weight: 132 lb 1.6 oz (59.9 kg)    GENERAL:alert, no distress and comfortable SKIN: skin color, texture, turgor are normal, no rashes or significant lesions EYES: normal, conjunctiva are pink and non-injected, sclera clear OROPHARYNX:no exudate, no erythema and lips, buccal mucosa, and tongue normal  NECK: supple, thyroid normal size, non-tender, without nodularity LYMPH:  no palpable lymphadenopathy in the cervical, axillary or inguinal LUNGS: clear to auscultation and percussion with normal breathing effort HEART: regular rate & rhythm and no murmurs and no lower extremity edema ABDOMEN:abdomen soft, non-tender and normal bowel sounds Musculoskeletal:no  cyanosis of digits and no clubbing  PSYCH: alert & oriented x 3 with fluent speech NEURO: no focal motor/sensory deficits Breasts: Breast inspection showed them to be symmetrical with no nipple discharge. Palpation of the right breast and axilla revealed no obvious mass that I could appreciate. Left breast with 2.5 cm lump on the lateral of left nipple possible related to biopsy. No other mass or adenopathy. No mass to left axilla.  LABORATORY DATA:  I have reviewed the data as listed CBC Latest Ref Rng & Units 06/26/2017 02/18/2017 02/09/2016  WBC 3.9 - 10.3 K/uL 4.8 5.2 4.8  Hemoglobin 12.0 - 15.0 g/dL - 12.9 12.4  Hematocrit 34.8 - 46.6 % 36.8  38.7 36.2  Platelets 145 - 400 K/uL 246 269.0 234.0    CMP Latest Ref Rng & Units 06/26/2017 02/18/2017 02/09/2016  Glucose 70 - 140 mg/dL 68(L) 96 92  BUN 7 - 26 mg/dL _0 Creatinine 0.60 - 1.10 mg/dL 1.05 0.74 0.73  Sodium 136 - 145 mmol/L 142 139 139  Potassium 3.5 - 5.1 mmol/L 4.4 4.1 4.3  Chloride 98 - 109 mmol/L 108 102 104  CO2 22 - 29 mmol/L _1 Calcium 8.4 - 10.4 mg/dL 9.1 9.4 9.0  Total Protein 6.4 - 8.3 g/dL 6.9 7.0 6.8  Total Bilirubin 0.2 - 1.2 mg/dL 0.5 0.6 0.5  Alkaline Phos 40 - 150 U/L 87 76 87  AST 5 - 34 U/L _2 ALT 0 - 55 U/L _3 PATHOLOGY:  Left needle core biopsy, 06/17/2017 IMPRESSION: Breast, left, needle core biopsy, central lower with high grade ductal carcinoma in situ with necrosis.  PROGNOSTIC INDICATORS Results: IMMUNOHISTOCHEMICAL AND MORPHOMETRIC ANALYSIS PERFORMED MANUALLY Estrogen Receptor: 0%, NEGATIVE Progesterone Receptor: 0%, NEGATIVE  Left needle core biopsy,  06/20/2017 IMPRESSION: Breast, left, needle core biopsy, outer anterior with benign breast tissue with focal chronic inflammation and features suggestive of granuloma. Vessels with calcifications.   RADIOGRAPHIC STUDIES: I have personally reviewed the radiological images as listed and agreed with the findings in the  report. Mr Breast Bilateral W Wo Contrast Inc Cad  Result Date: 06/04/2017 CLINICAL DATA:  New left nipple inversion and new 3 cm area of linearly arranged calcifications in the 3 o'clock position of the left breast anteriorly. LABS:  Creatinine was obtained on site at [facility] at [address] Results: Creatinine 0.8 mg/dL. EXAM: BILATERAL BREAST MRI WITH AND WITHOUT CONTRAST TECHNIQUE: Multiplanar, multisequence MR images of both breasts were obtained prior to and following the intravenous administration of 12 ml of MultiHance. THREE-DIMENSIONAL MR IMAGE RENDERING ON INDEPENDENT WORKSTATION: Three-dimensional MR images were rendered by post-processing of the original MR data on an independent workstation. The three-dimensional MR images were interpreted, and findings are reported in the following complete MRI report for this study. Three dimensional images were evaluated at the independent DynaCad workstation COMPARISON:  Previous exam(s). FINDINGS: Breast composition: b. Scattered fibroglandular tissue. Background parenchymal enhancement: Minimal with a mild background nodular component. Right breast: No mass or abnormal enhancement. Left breast: Clumped, linear enhancement in central left breast centered in the 5-6 o'clock position with a linear extension of ductal enhancement to the nipple with associated nipple inversion. The clumped linear enhancement measures 3.3 x 2.0 x 1.5 cm in maximum dimensions and the linear extension to the nipple measures 2.0 cm in length. The combined clumped linear enhancement and linear extension to the nipple measures 6.1 cm in AP diameter. The clumped linear enhancement has plateau enhancement kinetics. Lymph nodes: No abnormal appearing lymph nodes. Ancillary findings:  None. IMPRESSION: 1. Clumped linear enhancement in the central left breast centered in the 5 to 6:00 position with a linear extension to the inverted left nipple. Combined, this area measures  6.1 x 2.0 x 1.5 cm in maximum dimensions. This is highly suspicious for ductal carcinoma in situ. 2. No evidence of malignancy on the right. RECOMMENDATION: 1. MR guided core needle biopsy of the posterior portion of the clumped linear enhancement in the left breast. 2. Stereotactic guided core needle biopsy of the linearly arranged calcifications in the anterior left breast. The stereotactic biopsy has been scheduled at 10:30 a.m.  on 06/20/2017. It is recommended that the MR guided core needle biopsy be performed prior to the stereotactic biopsy. BI-RADS CATEGORY  5: Highly suggestive of malignancy. Electronically Signed   By: Claudie Revering M.D.   On: 06/04/2017 15:12   Mm Digital Diagnostic Unilat L  Result Date: 05/28/2017 CLINICAL DATA:  The patient was called back for new left nipple inversion which the patient states has been present for 1 month. She was also called back for new calcifications in a linear arrangement in the lateral periareolar region. EXAM: 2D DIGITAL DIAGNOSTIC LEFT MAMMOGRAM WITH CAD AND ADJUNCT TOMO ULTRASOUND LEFT BREAST COMPARISON:  Previous exam(s). ACR Breast Density Category b: There are scattered areas of fibroglandular density. FINDINGS: The linear calcifications in the left breast span up to 3 cm. They are most prominent anteriorly. Left nipple inversion remains. No suspicious masses or causes for inversion identified. Mammographic images were processed with CAD. On physical exam, no suspicious lumps are identified. Targeted ultrasound is performed, showing no sonographic correlate for the left nipple inversion. IMPRESSION: New left nipple inversion for 1 month. 3 cm of linear left breast calcifications. RECOMMENDATION: Recommend MRI of the left breast to evaluate for an underlying cause of nipple inversion. Recommend stereotactic biopsy of the new linear calcifications in the left breast. The MRI should be performed first, prior to the biopsy. I have discussed the findings and  recommendations with the patient. Results were also provided in writing at the conclusion of the visit. If applicable, a reminder letter will be sent to the patient regarding the next appointment. BI-RADS CATEGORY  4: Suspicious. Electronically Signed   By: Dorise Bullion III M.D   On: 05/28/2017 16:56   US Breast Ltd Uni Left Inc Axilla  Result Date: 05/28/2017 CLINICAL DATA:  The patient was called back for new left nipple inversion which the patient states has been present for 1 month. She was also called back for new calcifications in a linear arrangement in the lateral periareolar region. EXAM: 2D DIGITAL DIAGNOSTIC LEFT MAMMOGRAM WITH CAD AND ADJUNCT TOMO ULTRASOUND LEFT BREAST COMPARISON:  Previous exam(s). ACR Breast Density Category b: There are scattered areas of fibroglandular density. FINDINGS: The linear calcifications in the left breast span up to 3 cm. They are most prominent anteriorly. Left nipple inversion remains. No suspicious masses or causes for inversion identified. Mammographic images were processed with CAD. On physical exam, no suspicious lumps are identified. Targeted ultrasound is performed, showing no sonographic correlate for the left nipple inversion. IMPRESSION: New left nipple inversion for 1 month. 3 cm of linear left breast calcifications. RECOMMENDATION: Recommend MRI of the left breast to evaluate for an underlying cause of nipple inversion. Recommend stereotactic biopsy of the new linear calcifications in the left breast. The MRI should be performed first, prior to the biopsy. I have discussed the findings and recommendations with the patient. Results were also provided in writing at the conclusion of the visit. If applicable, a reminder letter will be sent to the patient regarding the next appointment. BI-RADS CATEGORY  4: Suspicious. Electronically Signed   By: Dorise Bullion III M.D   On: 05/28/2017 16:56   Mm Clip Placement Left  Result Date: 06/20/2017 CLINICAL  DATA:  Status post stereotactic biopsy today for left breast calcifications. EXAM: DIAGNOSTIC LEFT MAMMOGRAM POST STEREOTACTIC BIOPSY COMPARISON:  Previous exam(s). FINDINGS: Mammographic images were obtained following stereotactic guided biopsy of calcifications within the outer left breast at anterior depth. At the conclusion of the procedure, a  coil shaped tissue marker was placed at the biopsy site. Biopsy clip is well-positioned at the site of the targeted calcifications. IMPRESSION: Coil shaped biopsy clip well-positioned at the site of the targeted calcifications within the anterior left breast. Patient developed persistent bleeding at the biopsy site requiring placement of pressure bandage. Final Assessment: Post Procedure Mammograms for Marker Placement Electronically Signed   By: Franki Cabot M.D.   On: 06/20/2017 12:10   Mm Clip Placement Left  Result Date: 06/17/2017 CLINICAL DATA:  Post MRI guided biopsy of suspicious linear clumped non mass enhancement in the central lower posterior left breast. EXAM: DIAGNOSTIC LEFT MAMMOGRAM POST MRI BIOPSY COMPARISON:  Previous exam(s). FINDINGS: Mammographic images were obtained following MRI guided biopsy of suspicious clumped linear non mass enhancement in the central lower posterior left breast. A cylindrical shaped biopsy marking clip is present at the site of the biopsied clumped non mass enhancement in the central lower posterior left breast. IMPRESSION: 1. Cylindrical shaped biopsy marking clip at site of biopsied clumped non mass enhancement in the central lower posterior left breast. 2. The patient is scheduled to have a stereotactic guided biopsy of calcifications in the subareolar left breast on Thursday 06/20/2017. Final Assessment: Post Procedure Mammograms for Marker Placement Electronically Signed   By: Everlean Alstrom M.D.   On: 06/17/2017 09:22   Mm Lt Breast Bx W Loc Dev 1st Lesion Image Bx Spec Stereo Guide  Addendum Date: 06/25/2017    ADDENDUM REPORT: 06/25/2017 09:15 ADDENDUM: Pathology revealed BENIGN BREAST TISSUE WITH FOCAL CHRONIC INFLAMMATION AND FEATURES SUGGESTIVE OF GRANULOMA. VESSELS WITH CALCIFICATIONS of the Left breast, outer anterior. This was found to be concordant by Dr. Franki Cabot. Pathology results were discussed with the patient by telephone. The patient reported doing well after the biopsy with tenderness and bruising at the site. Post biopsy instructions and care were reviewed and questions were answered. The patient was encouraged to call [facility] for any additional concerns. The patient has a recent diagnosis of left breast cancer and should follow her outlined treatment plan. Pathology results reported by Roselind Messier, RN on 06/25/2017. Electronically Signed   By: Franki Cabot M.D.   On: 06/25/2017 09:15   Result Date: 06/25/2017 CLINICAL DATA:  Patient with recent biopsy-proven DCIS of the left breast. Patient with additional left breast calcifications presents today for stereotactic biopsy using 3D tomosynthesis guidance. EXAM: LEFT BREAST STEREOTACTIC CORE NEEDLE BIOPSY COMPARISON:  Previous exams. FINDINGS: The patient and I discussed the procedure of stereotactic-guided biopsy including benefits and alternatives. We discussed the high likelihood of a successful procedure. We discussed the risks of the procedure including infection, bleeding, tissue injury, clip migration, and inadequate sampling. Informed written consent was given. The usual time out protocol was performed immediately prior to the procedure. Using sterile technique and 1% Lidocaine as local anesthetic, under stereotactic guidance, a 9 gauge vacuum assisted device was used to perform core needle biopsy of calcifications in the outer left breast, at anterior depth, using a lateral approach. Specimen radiograph was performed showing calcifications. Specimens with calcifications are identified for pathology. Lesion  quadrant: Upper outer quadrant At the conclusion of the procedure, a coil shaped tissue marker clip was deployed into the biopsy cavity. Follow-up 2-view mammogram was performed and dictated separately. IMPRESSION: Stereotactic-guided biopsy of calcifications within the outer left breast, at anterior depth. No apparent complications. Electronically Signed: By: Franki Cabot M.D. On: 06/20/2017 11:24   Mr Aundra Millet Breast Bx W Loc  Dev 1st Lesion Image Bx Spec Mr Guide  Addendum Date: 06/19/2017   ADDENDUM REPORT: 06/19/2017 15:29 ADDENDUM: Pathology revealed HIGH GRADE DUCTAL CARCINOMA IN SITU WITH NECROSIS of the Left breast, central lower. This was found to be concordant by Dr. Everlean Alstrom. Pathology results were discussed with the patient by telephone. The patient reported doing well after the biopsy with tenderness at the site. Post biopsy instructions and care were reviewed and questions were answered. The patient was encouraged to call [facility] for any additional concerns. The patient was referred to [facility] on June 26, 2017. The patient is scheduled for an additional Left breast on June 20, 2017 at [facility] Pathology results reported by Terie Purser, RN on 06/19/2017. Electronically Signed   By: Everlean Alstrom M.D.   On: 06/19/2017 15:29   Result Date: 06/19/2017 CLINICAL DATA:  67 year old female with history of new onset left nipple inversion and suspicious linear clumped enhancement extending from the nipple in the central lower left breast. EXAM: MRI GUIDED CORE NEEDLE BIOPSY OF THE LEFT BREAST TECHNIQUE: Multiplanar, multisequence MR imaging of the left breast was performed both before and after administration of intravenous contrast. CONTRAST:  12 mL of MultiHance. COMPARISON:  Previous exams. FINDINGS: I met with the patient, and we discussed the procedure of MRI guided  biopsy, including risks, benefits, and alternatives. Specifically, we discussed the risks of infection, bleeding, tissue injury, clip migration, and inadequate sampling. Informed, written consent was given. The usual time out protocol was performed immediately prior to the procedure. Using sterile technique, 1% Lidocaine, MRI guidance, and a 9 gauge vacuum assisted device, biopsy was performed of the posterior aspect of the clumped enhancement in the central lower posterior left breast using a lateral to medial approach. At the conclusion of the procedure, a cylindrical shaped tissue marker clip was deployed into the biopsy cavity. Follow-up 2-view mammogram was performed and dictated separately. IMPRESSION: MRI guided biopsy of the posterior aspect of the suspicious linear clumped enhancement in the left breast. No apparent complications. Electronically Signed: By: Everlean Alstrom M.D. On: 06/17/2017 09:19    ASSESSMENT & PLAN:  Bridget Strong is a 67 y.o. menopausal female with screening discovered to DCIS with a history of HTN, HLD  1. 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 -She is a candidate for breast conservation surgery. She has been seen by breast surgeon Dr. Marlou Starks, who recommends central lumpectomy without nipple sparing. -Given her strong family history of breast cancer, we recommend her to undergo genetic testing to ruled out inheritable breast cancer, she declined  -Her DCIS will be cured by complete surgical resection. Any form of adjuvant therapy is preventive. -Given her strongly negative ER and PR, I do not recommend antiestrogen therapy at this time.  -She will likely benefit from breast radiation if she undergo lumpectomy to decrease the risk of breast cancer. -We also discussed that biopsy may have sampling limitation, we will  review her surgical path, to see if she has any invasive carcinoma components. -We discussed breast cancer surveillance after she completes treatment, Including annual mammogram, breast exam every 6-12 months.  She will likely follow up with her PCP or gynecologist for breast cancer surveillance.  -Advised the patient to discontinue hormonal  replacement therapy, increase veggies incorporate a healthy diet, and improve exercise.  Plan -She will likely have central lumpectomy without nipple sparing soon -F/u PRN if the patient would like long-term follow up    No orders of the defined types were placed in this encounter.   All questions were answered. The patient knows to call the clinic with any problems, questions or concerns. I spent 30 minutes counseling the patient face to face. The total time spent in the appointment was 45 minutes and more than 50% was on counseling.     Truitt Merle, MD 06/26/2017 6:27 PM   This document serves as a record of services personally performed by Truitt Merle, MD. It was created on her behalf by Steva Colder, a trained medical scribe. The creation of this record is based on the scribe's personal observations and the provider's statements to them.   I have reviewed the above documentation for accuracy and completeness, and I agree with the above.

## 2017-06-26 ENCOUNTER — Other Ambulatory Visit: Payer: Self-pay

## 2017-06-26 ENCOUNTER — Ambulatory Visit
Admission: RE | Admit: 2017-06-26 | Discharge: 2017-06-26 | Disposition: A | Discharge status: Home / Self Care | Payer: Medicare Other | Source: Ambulatory Visit | Attending: Radiation Oncology | Admitting: Radiation Oncology

## 2017-06-26 ENCOUNTER — Encounter: Payer: Self-pay | Admitting: General Practice

## 2017-06-26 ENCOUNTER — Ambulatory Visit: Payer: Self-pay | Admitting: General Surgery

## 2017-06-26 ENCOUNTER — Inpatient Hospital Stay: Payer: Medicare Other

## 2017-06-26 ENCOUNTER — Encounter: Payer: Self-pay | Admitting: Hematology

## 2017-06-26 ENCOUNTER — Ambulatory Visit: Payer: Medicare Other | Attending: General Surgery | Admitting: Physical Therapy

## 2017-06-26 ENCOUNTER — Inpatient Hospital Stay: Payer: Medicare Other | Attending: Hematology | Admitting: Hematology

## 2017-06-26 ENCOUNTER — Encounter: Payer: Self-pay | Admitting: Physical Therapy

## 2017-06-26 VITALS — BP 152/56 | HR 73 | Temp 97.8°F | Resp 17 | Ht 64.0 in | Wt 132.1 lb

## 2017-06-26 DIAGNOSIS — D0512 Intraductal carcinoma in situ of left breast: Secondary | ICD-10-CM | POA: Diagnosis not present

## 2017-06-26 DIAGNOSIS — E785 Hyperlipidemia, unspecified: Secondary | ICD-10-CM | POA: Insufficient documentation

## 2017-06-26 DIAGNOSIS — R293 Abnormal posture: Secondary | ICD-10-CM | POA: Insufficient documentation

## 2017-06-26 DIAGNOSIS — Z171 Estrogen receptor negative status [ER-]: Secondary | ICD-10-CM | POA: Diagnosis not present

## 2017-06-26 DIAGNOSIS — I10 Essential (primary) hypertension: Secondary | ICD-10-CM | POA: Diagnosis not present

## 2017-06-26 DIAGNOSIS — M25611 Stiffness of right shoulder, not elsewhere classified: Secondary | ICD-10-CM | POA: Diagnosis not present

## 2017-06-26 DIAGNOSIS — Z803 Family history of malignant neoplasm of breast: Secondary | ICD-10-CM | POA: Diagnosis not present

## 2017-06-26 DIAGNOSIS — Z79899 Other long term (current) drug therapy: Secondary | ICD-10-CM | POA: Insufficient documentation

## 2017-06-26 LAB — CBC WITH DIFFERENTIAL (CANCER CENTER ONLY)
Basophils Absolute: 0 10*3/uL (ref 0.0–0.1)
Basophils Relative: 0 %
EOS ABS: 0.1 10*3/uL (ref 0.0–0.5)
Eosinophils Relative: 1 %
HCT: 36.8 % (ref 34.8–46.6)
Hemoglobin: 12.2 g/dL (ref 11.6–15.9)
LYMPHS ABS: 1.8 10*3/uL (ref 0.9–3.3)
LYMPHS PCT: 38 %
MCH: 31 pg (ref 25.1–34.0)
MCHC: 33.2 g/dL (ref 31.5–36.0)
MCV: 93.4 fL (ref 79.5–101.0)
MONO ABS: 0.3 10*3/uL (ref 0.1–0.9)
MONOS PCT: 7 %
Neutro Abs: 2.6 10*3/uL (ref 1.5–6.5)
Neutrophils Relative %: 54 %
PLATELETS: 246 10*3/uL (ref 145–400)
RBC: 3.94 MIL/uL (ref 3.70–5.45)
RDW: 13 % (ref 11.2–14.5)
WBC Count: 4.8 10*3/uL (ref 3.9–10.3)

## 2017-06-26 LAB — CMP (CANCER CENTER ONLY)
ALT: 14 U/L (ref 0–55)
ANION GAP: 8 (ref 3–11)
AST: 11 U/L (ref 5–34)
Albumin: 3.9 g/dL (ref 3.5–5.0)
Alkaline Phosphatase: 87 U/L (ref 40–150)
BUN: 20 mg/dL (ref 7–26)
CHLORIDE: 108 mmol/L (ref 98–109)
CO2: 26 mmol/L (ref 22–29)
CREATININE: 1.05 mg/dL (ref 0.60–1.10)
Calcium: 9.1 mg/dL (ref 8.4–10.4)
GFR, Estimated: 53 mL/min — ABNORMAL LOW (ref >60)
Glucose, Bld: 68 mg/dL — ABNORMAL LOW (ref 70–140)
POTASSIUM: 4.4 mmol/L (ref 3.5–5.1)
Sodium: 142 mmol/L (ref 136–145)
Total Bilirubin: 0.5 mg/dL (ref 0.2–1.2)
Total Protein: 6.9 g/dL (ref 6.4–8.3)

## 2017-06-26 NOTE — Therapy (Addendum)
[place], [place], [address] Phone: [phone]   Fax:  [phone]  Physical Therapy Evaluation  Patient Details  Name: Bridget Strong MRN: [id number] Date of Birth: [birth year] Referring Provider: Dr. Autumn Messing   Encounter Date: 06/26/2017  PT End of Session - 06/26/17 1545    Visit Number  1    Number of Visits  2    Date for PT Re-Evaluation  08/21/17    PT Start Time  8588    PT Stop Time  5027 Also saw pt from 1440-1448 for a total of 27 minutes    PT Time Calculation (min)  19 min    Activity Tolerance  Patient tolerated treatment well    Behavior During Therapy  [facility] for tasks assessed/performed       Past Medical History:  Diagnosis Date  . Broken humerus 10-24-13  . Chicken pox   . Complication of anesthesia 1998   Developed bigeminy post op  . Hyperlipidemia   . Hypertension   . PONV (postoperative nausea and vomiting)     Past Surgical History:  Procedure Laterality Date  . ABDOMINAL HYSTERECTOMY  1998   TVH--ovaries remain  . ABDOMINAL SACROCOLPOPEXY N/A 11/23/2014   Procedure: ABDOMINAL SACROCOLPOPEXY/HALBAN'S CULDOPLASTY;  Surgeon: Nunzio Cobbs, MD;  Location: [facility];  Service: Gynecology;  Laterality: N/A;  . ANTERIOR AND POSTERIOR REPAIR N/A 11/23/2014   Procedure: ANTERIOR (CYSTOCELE) ;  Surgeon: Nunzio Cobbs, MD;  Location: [facility];  Service: Gynecology;  Laterality: N/A;  . BLADDER SUSPENSION N/A 11/23/2014   Procedure: EXACT MIDURETERAL SLING;  Surgeon: Nunzio Cobbs, MD;  Location: [facility];  Service: Gynecology;  Laterality: N/A;  . CHOLECYSTECTOMY  2004  . CYSTOSCOPY N/A 11/23/2014   Procedure: CYSTOSCOPY;  Surgeon: Nunzio Cobbs, MD;  Location: [facility];  Service: Gynecology;  Laterality: N/A;  . LYSIS OF ADHESION N/A 11/23/2014   Procedure: LYSIS OF ADHESION;  Surgeon: Nunzio Cobbs, MD;  Location: [facility];  Service:  Gynecology;  Laterality: N/A;  . SALPINGOOPHORECTOMY Bilateral 11/23/2014   Procedure: BILATERAL SALPINGO OOPHORECTOMY;  Surgeon: Nunzio Cobbs, MD;  Location: [facility];  Service: Gynecology;  Laterality: Bilateral;  . TUBAL LIGATION      There were no vitals filed for this visit.   Subjective Assessment - 06/26/17 1534    Subjective  Patient presents to the [facility] to be seen by her medical team for her newly diagnosed left breast cancer.    Patient is accompained by:  Family member    Pertinent History  Patient was diagnosed on 05/23/17 with left DCIS breast cancer. It is ER/PR negative. It measures 3.3 cm and is located in the lower outer quadrant. She has a history of a right humeral fracture in 2015 with limited shoulder ROM as a result.    Patient Stated Goals  Reduce lymphedema risk and learn post op shoulder ROM HEP    Currently in Pain?  No/denies         [facility] PT Assessment - 06/26/17 0001      Assessment   Medical Diagnosis  Left breast cancer    Referring Provider  Dr. Autumn Messing    Onset Date/Surgical Date  05/23/17    Hand Dominance  Right    Prior Therapy  none      Precautions   Precautions  Other (comment)  Precaution Comments  active cancer      Restrictions   Weight Bearing Restrictions  No      Balance Screen   Has the patient fallen in the past 6 months  No    Has the patient had a decrease in activity level because of a fear of falling?   No    Is the patient reluctant to leave their home because of a fear of falling?   No      Home Environment   Living Environment  Private residence    Living Arrangements  Spouse/significant other    Available Help at Discharge  Family      Prior Function   Level of [place]  Retired    Leisure  She goes to the gym 2x.week and rides a bike for 30 min      Cognition   Overall Cognitive Status  Within Functional Limits for tasks assessed      Posture/Postural Control    Posture/Postural Control  Postural limitations    Postural Limitations  Rounded Shoulders;Forward head      ROM / Strength   AROM / PROM / Strength  AROM;Strength      AROM   AROM Assessment Site  Shoulder;Cervical    Right/Left Shoulder  Right;Left    Right Shoulder Extension  42 Degrees    Right Shoulder Flexion  124 Degrees    Right Shoulder ABduction  146 Degrees    Right Shoulder Internal Rotation  64 Degrees    Right Shoulder External Rotation  73 Degrees    Left Shoulder Extension  54 Degrees    Left Shoulder Flexion  155 Degrees    Left Shoulder ABduction  160 Degrees    Left Shoulder Internal Rotation  69 Degrees    Left Shoulder External Rotation  79 Degrees    Cervical Flexion  WNL    Cervical Extension  WNL    Cervical - Right Side Bend  25% limited    Cervical - Left Side Bend  25% limited    Cervical - Right Rotation  WNL    Cervical - Left Rotation  WNL      Strength   Overall Strength  Within functional limits for tasks performed        LYMPHEDEMA/ONCOLOGY QUESTIONNAIRE - 06/26/17 1543      Type   Cancer Type  Left breast cancer      Lymphedema Assessments   Lymphedema Assessments  Upper extremities      Right Upper Extremity Lymphedema   10 cm Proximal to Olecranon Process  24.9 cm    Olecranon Process  22.5 cm    10 cm Proximal to Ulnar Styloid Process  18.7 cm    Just Proximal to Ulnar Styloid Process  13.5 cm    Across Hand at [employer]  17.3 cm    At [place] of 2nd Digit  5.3 cm      Left Upper Extremity Lymphedema   10 cm Proximal to Olecranon Process  24.4 cm    Olecranon Process  23 cm    10 cm Proximal to Ulnar Styloid Process  19 cm    Just Proximal to Ulnar Styloid Process  13.4 cm    Across Hand at [employer]  18.3 cm    At [place] of 2nd Digit  5.3 cm          Objective measurements completed on examination: See above  findings.     Patient was instructed today in a home exercise program today for post op shoulder  range of motion. These included active assist shoulder flexion in sitting, scapular retraction, wall walking with shoulder abduction, and hands behind head external rotation.  She was encouraged to do these twice a day, holding 3 seconds and repeating 5 times when permitted by her physician.     PT Education - 06/26/17 1544    Education provided  Yes    Education Details  Lymphedema risk reduction and post op shoulder ROM HEP    Person(s) Educated  Patient;Spouse    Methods  Explanation;Demonstration;Handout    Comprehension  Returned demonstration;Verbalized understanding          PT Long Term Goals - 06/26/17 1550      PT LONG TERM GOAL #1   Title  Patient will demonstrate she is able to return to baseline related to shoulder ROM and function post operatively.    Time  8    Period  Weeks    Status  New      Breast Clinic Goals - 06/26/17 1550      Patient will be able to verbalize understanding of pertinent lymphedema risk reduction practices relevant to her diagnosis specifically related to skin care.   Time  1    Period  Days    Status  Achieved      Patient will be able to return demonstrate and/or verbalize understanding of the post-op home exercise program related to regaining shoulder range of motion.   Time  1    Period  Days    Status  Achieved      Patient will be able to verbalize understanding of the importance of attending the postoperative After Breast Cancer Class for further lymphedema risk reduction education and therapeutic exercise.   Time  1    Period  Days    Status  Achieved            Plan - 06/26/17 1546    Clinical Impression Statement  Patient was diagnosed on 05/23/17 with left DCIS breast cancer. It is ER/PR negative. It measures 3.3 cm and is located in the lower outer quadrant. She has a history of a right humeral fracture in 2015 with limited shoulder ROM as a result. Her multidisciplinary medical team met prior to her assessments to  determine a recommended treatment plan. She is planning to have a left lumpectomy and sentinel node biopsy followed by radiation. She will benefit from a post op PT assessment to reassess and determine PT needs.    History and Personal Factors relevant to plan of care:  None    Clinical Presentation  Stable    Clinical Decision Making  Low    Rehab Potential  Excellent    Clinical Impairments Affecting Rehab Potential  None    PT Frequency  -- Eval and 1 f/u visit    PT Treatment/Interventions  ADLs/Self Care Home Management;Therapeutic exercise;Patient/family education    PT Next Visit Plan  Will reassess 3-4 weeks post op    PT Home Exercise Plan  Post op shoudler ROM HEP    Consulted and Agree with Plan of Care  Patient;Family member/caregiver    Family Member Consulted  Husband       Patient will benefit from skilled therapeutic intervention in order to improve the following deficits and impairments:  Pain, Postural dysfunction, Impaired UE functional use, Decreased knowledge of precautions, Decreased range of  motion  Visit Diagnosis: Ductal carcinoma in situ (DCIS) of left breast - Plan: PT plan of care cert/re-cert  Abnormal posture - Plan: PT plan of care cert/re-cert  Stiffness of right shoulder, not elsewhere classified - Plan: PT plan of care cert/re-cert   Patient will follow up at outpatient cancer rehab 3-4 weeks following surgery.  If the patient requires physical therapy at that time, a specific plan will be dictated and sent to the referring physician for approval. The patient was educated today on appropriate basic range of motion exercises to begin post operatively and the importance of attending the After Breast Cancer class following surgery.  Patient was educated today on lymphedema risk reduction practices as it pertains to recommendations that will benefit the patient immediately following surgery.  She verbalized good understanding.     Problem List Patient  Active Problem List   Diagnosis Date Noted  . Ductal carcinoma in situ (DCIS) of left breast 06/19/2017  . Status post surgery 11/23/2014  . Vaginal vault prolapse after hysterectomy 11/09/2013  . BACTERIAL PNEUMONIA 06/13/2010  . FEVER UNSPECIFIED 06/09/2010  . HYPERLIPIDEMIA 02/09/2009  . HYPERTENSION 02/09/2009   Annia Friendly, PT 06/26/17 3:53 PM  [place], [place], [address] Phone: [phone]   Fax:  [phone]  Name: Bridget Strong MRN: [id number] Date of Birth: [birth year]  PHYSICAL THERAPY DISCHARGE SUMMARY  Visits from Start of Care: 1  Current functional level related to goals / functional outcomes: Spoke with patient and she reports she is doing well and reports no shoulder limitation. She requested to not return to PT.   Remaining deficits: None per patient reports   Education / Equipment: Post op shoulder ROM HEP Plan: Patient agrees to discharge.  Patient goals were not met. Patient is being discharged due to being pleased with the current functional level.  ?????    Annia Friendly, [place] 08/27/17 3:21 PM

## 2017-06-26 NOTE — Patient Instructions (Signed)

## 2017-06-26 NOTE — Progress Notes (Signed)
Nutrition Assessment  Reason for Assessment:  Pt seen in Breast Clinic  ASSESSMENT:   67 year old female with new diagnosis of breast cancer.  Past medical history reviewed.  Patient reports good appetite.  Medications:  reviewed  Labs: reviewed  Anthropometrics:   Height: 64 inches Weight: 132 lb 1.6 oz BMI: 22   NUTRITION DIAGNOSIS: Food and nutrition related knowledge deficit related to new diagnosis of breast cancer as evidenced by no prior need for nutrition related information.  INTERVENTION:   Discussed and provided packet of information regarding nutritional tips for breast cancer patients.  Questions answered.  Teachback method used.  Contact information provided and patient knows to contact me with questions/concerns.    MONITORING, EVALUATION, and GOAL: Pt will consume a healthy plant based diet to maintain lean body mass throughout treatment.   Bridget Strong B. Zenia Resides, [place], [place] Registered Dietitian [phone] (pager)

## 2017-06-26 NOTE — Progress Notes (Signed)
Radiation Oncology         [phone] ________________________________  Name: Bridget Strong        MRN: [id number]  Date of Service: 06/26/2017 DOB: Dec 19, 1950  [id number], [name], MD  Jovita Kussmaul, MD     REFERRING PHYSICIAN: Jovita Kussmaul, MD   DIAGNOSIS: The encounter diagnosis was Ductal carcinoma in situ (DCIS) of left breast.   HISTORY OF PRESENT ILLNESS: Bridget Strong is a 67 y.o. female seen in the multidisciplinary breast clinic for a new diagnosis of left breast cancer. The patient was noted to have screening detercted calcifications of the left breast. Diagnostic imaging revealed a 3 cm span of calcifications between 5-6:00.there was no sonographic correlate. An MRI was performed and revealed  3.3 x 2x1.5 cm linear area of enhancement with extension to the nipple. No adenopathy was noted. A biopsy of the anterior and posterior aspects of the enhancement were each obtained. The anterior aspect of this was negative for disease, and the posterior site was consistent with a high grade DCIS with calcifications and necrosis, ER/PR negative. She comes today to discuss options of treatment for her diagnosis.    PREVIOUS RADIATION THERAPY: No   PAST MEDICAL HISTORY:  Past Medical History:  Diagnosis Date  . Anemia    with pregnancy  . Broken humerus 10-24-13  . Chicken pox   . Complication of anesthesia 1998   Developed bigeminy post op  . Hyperlipidemia   . Hypertension   . PONV (postoperative nausea and vomiting)        PAST SURGICAL HISTORY: Past Surgical History:  Procedure Laterality Date  . ABDOMINAL HYSTERECTOMY  1998   TVH--ovaries remain  . ABDOMINAL SACROCOLPOPEXY N/A 11/23/2014   Procedure: ABDOMINAL SACROCOLPOPEXY/HALBAN'S CULDOPLASTY;  Surgeon: Nunzio Cobbs, MD;  Location: [facility];  Service: Gynecology;  Laterality: N/A;  . ANTERIOR AND POSTERIOR REPAIR N/A 11/23/2014   Procedure: ANTERIOR (CYSTOCELE) ;  Surgeon: Nunzio Cobbs, MD;  Location: [facility];  Service: Gynecology;  Laterality: N/A;  . BLADDER SUSPENSION N/A 11/23/2014   Procedure: EXACT MIDURETERAL SLING;  Surgeon: Nunzio Cobbs, MD;  Location: [facility];  Service: Gynecology;  Laterality: N/A;  . CHOLECYSTECTOMY  2004  . CYSTOSCOPY N/A 11/23/2014   Procedure: CYSTOSCOPY;  Surgeon: Nunzio Cobbs, MD;  Location: [facility];  Service: Gynecology;  Laterality: N/A;  . LYSIS OF ADHESION N/A 11/23/2014   Procedure: LYSIS OF ADHESION;  Surgeon: Nunzio Cobbs, MD;  Location: [facility];  Service: Gynecology;  Laterality: N/A;  . SALPINGOOPHORECTOMY Bilateral 11/23/2014   Procedure: BILATERAL SALPINGO OOPHORECTOMY;  Surgeon: Nunzio Cobbs, MD;  Location: [facility];  Service: Gynecology;  Laterality: Bilateral;  . TUBAL LIGATION       FAMILY HISTORY:  Family History  Problem Relation Age of Onset  . Stroke Father   . Alcohol abuse Father   . Alcohol abuse Other   . Arthritis Other   . Hypertension Other   . Stroke Other   . Breast cancer Maternal Grandmother 58  . Breast cancer Paternal Grandmother 36     SOCIAL HISTORY:  reports that  has never smoked. she has never used smokeless tobacco. She reports that she does not drink alcohol or use drugs. The patient is married and lives in [place]. She's originally from [place], [place]. 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  ALLERGIES: Diflucan [fluconazole]; Hydrocodone; and Tramadol   MEDICATIONS:  Current Outpatient Medications  Medication Sig Dispense Refill  . Calcium Carbonate-Vitamin D (CALCIUM-VITAMIN D) 600-200 MG-UNIT CAPS Take by mouth daily.      Marland Kitchen lisinopril (PRINIVIL,ZESTRIL) 20 MG tablet Take 1 tablet (20 mg total) by mouth daily. 90 tablet 3  . pneumococcal 23 valent vaccine (PNU-IMMUNE) 25 MCG/0.5ML injection Inject 0.5 mLs into the muscle tomorrow at 10 am. (Patient not taking: Reported on 02/18/2017) 2.5 mL 0  .  pravastatin (PRAVACHOL) 40 MG tablet TAKE 1 TABLET BY MOUTH DAILY 90 tablet 3  . vitamin C (ASCORBIC ACID) 500 MG tablet Take 500 mg by mouth daily.       No current facility-administered medications for this encounter.      REVIEW OF SYSTEMS: On review of systems, the patient reports that she is doing well overall. She denies any chest pain, shortness of breath, cough, fevers, chills, night sweats, unintended weight changes. She denies any bowel or bladder disturbances, and denies abdominal pain, nausea or vomiting. She denies any new musculoskeletal or joint aches or pains. A complete review of systems is obtained and is otherwise negative.     PHYSICAL EXAM:  Wt Readings from Last 3 Encounters:  06/14/17 130 lb (59 kg)  02/18/17 129 lb (58.5 kg)  06/13/16 131 lb 3.2 oz (59.5 kg)   Temp Readings from Last 3 Encounters:  02/18/17 98.2 F (36.8 C) (Oral)  02/15/16 98 F (36.7 C) (Oral)  02/14/15 98 F (36.7 C) (Oral)   BP Readings from Last 3 Encounters:  06/14/17 138/70  02/18/17 96/60  06/13/16 (!) 123/57   Pulse Readings from Last 3 Encounters:  06/14/17 72  02/18/17 84  06/13/16 74     In general this is a well appearing caucasian female in no acute distress. She is alert and oriented x4 and appropriate throughout the examination. HEENT reveals that the patient is normocephalic, atraumatic. EOMs are intact. PERRLA. Skin is intact without any evidence of gross lesions. Cardiovascular exam reveals a regular rate and rhythm, no clicks rubs or murmurs are auscultated. Chest is clear to auscultation bilaterally. Lymphatic assessment is performed and does not reveal any adenopathy in the cervical, supraclavicular, axillary, or inguinal chains. Bilateral breast exam is performed and reveals significant ecchymosis of the left breast consistent with prior biopsy. No mass is noted in the right breast. No nipple bleeding or discharge is noted. Abdomen has active bowel sounds in all  quadrants and is intact. The abdomen is soft, non tender, non distended. Lower extremities are negative for pretibial pitting edema, deep calf tenderness, cyanosis or clubbing.   ECOG = 0  0 - Asymptomatic (Fully active, able to carry on all predisease activities without restriction)  1 - Symptomatic but completely ambulatory (Restricted in physically strenuous activity but ambulatory and able to carry out work of a light or sedentary nature. For example, light housework, office work)  2 - Symptomatic, <50% in bed during the day (Ambulatory and capable of all self care but unable to carry out any work activities. Up and about more than 50% of waking hours)  3 - Symptomatic, >50% in bed, but not bedbound (Capable of only limited self-care, confined to bed or chair 50% or more of waking hours)  4 - Bedbound (Completely disabled. Cannot carry on any self-care. Totally confined to bed or chair)  5 - Death   Eustace Pen MM, Creech RH, Tormey DC, et al. [phone]). "Toxicity and response criteria  of the [facility]". [place] Oncol. 5 (6): 649-55    LABORATORY DATA:  Lab Results  Component Value Date   WBC 5.2 02/18/2017   HGB 12.9 02/18/2017   HCT 38.7 02/18/2017   MCV 94.7 02/18/2017   PLT 269.0 02/18/2017   Lab Results  Component Value Date   NA 139 02/18/2017   K 4.1 02/18/2017   CL 102 02/18/2017   CO2 30 02/18/2017   Lab Results  Component Value Date   ALT 19 02/18/2017   AST 14 02/18/2017   ALKPHOS 76 02/18/2017   BILITOT 0.6 02/18/2017      RADIOGRAPHY: Mr Breast Bilateral W Wo Contrast Inc Cad  Result Date: 06/04/2017 CLINICAL DATA:  New left nipple inversion and new 3 cm area of linearly arranged calcifications in the 3 o'clock position of the left breast anteriorly. LABS:  Creatinine was obtained on site at [facility] at [address] Results: Creatinine 0.8 mg/dL. EXAM: BILATERAL BREAST MRI WITH AND WITHOUT CONTRAST TECHNIQUE:  Multiplanar, multisequence MR images of both breasts were obtained prior to and following the intravenous administration of 12 ml of MultiHance. THREE-DIMENSIONAL MR IMAGE RENDERING ON INDEPENDENT WORKSTATION: Three-dimensional MR images were rendered by post-processing of the original MR data on an independent workstation. The three-dimensional MR images were interpreted, and findings are reported in the following complete MRI report for this study. Three dimensional images were evaluated at the independent DynaCad workstation COMPARISON:  Previous exam(s). FINDINGS: Breast composition: b. Scattered fibroglandular tissue. Background parenchymal enhancement: Minimal with a mild background nodular component. Right breast: No mass or abnormal enhancement. Left breast: Clumped, linear enhancement in central left breast centered in the 5-6 o'clock position with a linear extension of ductal enhancement to the nipple with associated nipple inversion. The clumped linear enhancement measures 3.3 x 2.0 x 1.5 cm in maximum dimensions and the linear extension to the nipple measures 2.0 cm in length. The combined clumped linear enhancement and linear extension to the nipple measures 6.1 cm in AP diameter. The clumped linear enhancement has plateau enhancement kinetics. Lymph nodes: No abnormal appearing lymph nodes. Ancillary findings:  None. IMPRESSION: 1. Clumped linear enhancement in the central left breast centered in the 5 to 6:00 position with a linear extension to the inverted left nipple. Combined, this area measures 6.1 x 2.0 x 1.5 cm in maximum dimensions. This is highly suspicious for ductal carcinoma in situ. 2. No evidence of malignancy on the right. RECOMMENDATION: 1. MR guided core needle biopsy of the posterior portion of the clumped linear enhancement in the left breast. 2. Stereotactic guided core needle biopsy of the linearly arranged calcifications in the anterior left breast. The stereotactic biopsy has  been scheduled at 10:30 a.m. on 06/20/2017. It is recommended that the MR guided core needle biopsy be performed prior to the stereotactic biopsy. BI-RADS CATEGORY  5: Highly suggestive of malignancy. Electronically Signed   By: Claudie Revering M.D.   On: 06/04/2017 15:12   Mm Digital Diagnostic Unilat L  Result Date: 05/28/2017 CLINICAL DATA:  The patient was called back for new left nipple inversion which the patient states has been present for 1 month. She was also called back for new calcifications in a linear arrangement in the lateral periareolar region. EXAM: 2D DIGITAL DIAGNOSTIC LEFT MAMMOGRAM WITH CAD AND ADJUNCT TOMO ULTRASOUND LEFT BREAST COMPARISON:  Previous exam(s). ACR Breast Density Category b: There are scattered areas of fibroglandular density. FINDINGS: The linear calcifications in  the left breast span up to 3 cm. They are most prominent anteriorly. Left nipple inversion remains. No suspicious masses or causes for inversion identified. Mammographic images were processed with CAD. On physical exam, no suspicious lumps are identified. Targeted ultrasound is performed, showing no sonographic correlate for the left nipple inversion. IMPRESSION: New left nipple inversion for 1 month. 3 cm of linear left breast calcifications. RECOMMENDATION: Recommend MRI of the left breast to evaluate for an underlying cause of nipple inversion. Recommend stereotactic biopsy of the new linear calcifications in the left breast. The MRI should be performed first, prior to the biopsy. I have discussed the findings and recommendations with the patient. Results were also provided in writing at the conclusion of the visit. If applicable, a reminder letter will be sent to the patient regarding the next appointment. BI-RADS CATEGORY  4: Suspicious. Electronically Signed   By: Dorise Bullion III M.D   On: 05/28/2017 16:56   US Breast Ltd Uni Left Inc Axilla  Result Date: 05/28/2017 CLINICAL DATA:  The patient was called  back for new left nipple inversion which the patient states has been present for 1 month. She was also called back for new calcifications in a linear arrangement in the lateral periareolar region. EXAM: 2D DIGITAL DIAGNOSTIC LEFT MAMMOGRAM WITH CAD AND ADJUNCT TOMO ULTRASOUND LEFT BREAST COMPARISON:  Previous exam(s). ACR Breast Density Category b: There are scattered areas of fibroglandular density. FINDINGS: The linear calcifications in the left breast span up to 3 cm. They are most prominent anteriorly. Left nipple inversion remains. No suspicious masses or causes for inversion identified. Mammographic images were processed with CAD. On physical exam, no suspicious lumps are identified. Targeted ultrasound is performed, showing no sonographic correlate for the left nipple inversion. IMPRESSION: New left nipple inversion for 1 month. 3 cm of linear left breast calcifications. RECOMMENDATION: Recommend MRI of the left breast to evaluate for an underlying cause of nipple inversion. Recommend stereotactic biopsy of the new linear calcifications in the left breast. The MRI should be performed first, prior to the biopsy. I have discussed the findings and recommendations with the patient. Results were also provided in writing at the conclusion of the visit. If applicable, a reminder letter will be sent to the patient regarding the next appointment. BI-RADS CATEGORY  4: Suspicious. Electronically Signed   By: Dorise Bullion III M.D   On: 05/28/2017 16:56   Mm Clip Placement Left  Result Date: 06/20/2017 CLINICAL DATA:  Status post stereotactic biopsy today for left breast calcifications. EXAM: DIAGNOSTIC LEFT MAMMOGRAM POST STEREOTACTIC BIOPSY COMPARISON:  Previous exam(s). FINDINGS: Mammographic images were obtained following stereotactic guided biopsy of calcifications within the outer left breast at anterior depth. At the conclusion of the procedure, a coil shaped tissue marker was placed at the biopsy site.  Biopsy clip is well-positioned at the site of the targeted calcifications. IMPRESSION: Coil shaped biopsy clip well-positioned at the site of the targeted calcifications within the anterior left breast. Patient developed persistent bleeding at the biopsy site requiring placement of pressure bandage. Final Assessment: Post Procedure Mammograms for Marker Placement Electronically Signed   By: Franki Cabot M.D.   On: 06/20/2017 12:10   Mm Clip Placement Left  Result Date: 06/17/2017 CLINICAL DATA:  Post MRI guided biopsy of suspicious linear clumped non mass enhancement in the central lower posterior left breast. EXAM: DIAGNOSTIC LEFT MAMMOGRAM POST MRI BIOPSY COMPARISON:  Previous exam(s). FINDINGS: Mammographic images were obtained following MRI guided biopsy of suspicious  clumped linear non mass enhancement in the central lower posterior left breast. A cylindrical shaped biopsy marking clip is present at the site of the biopsied clumped non mass enhancement in the central lower posterior left breast. IMPRESSION: 1. Cylindrical shaped biopsy marking clip at site of biopsied clumped non mass enhancement in the central lower posterior left breast. 2. The patient is scheduled to have a stereotactic guided biopsy of calcifications in the subareolar left breast on Thursday 06/20/2017. Final Assessment: Post Procedure Mammograms for Marker Placement Electronically Signed   By: Everlean Alstrom M.D.   On: 06/17/2017 09:22   Mm Lt Breast Bx W Loc Dev 1st Lesion Image Bx Spec Stereo Guide  Addendum Date: 06/25/2017   ADDENDUM REPORT: 06/25/2017 09:15 ADDENDUM: Pathology revealed BENIGN BREAST TISSUE WITH FOCAL CHRONIC INFLAMMATION AND FEATURES SUGGESTIVE OF GRANULOMA. VESSELS WITH CALCIFICATIONS of the Left breast, outer anterior. This was found to be concordant by Dr. Franki Cabot. Pathology results were discussed with the patient by telephone. The patient reported doing well after the biopsy with tenderness and  bruising at the site. Post biopsy instructions and care were reviewed and questions were answered. The patient was encouraged to call [facility] for any additional concerns. The patient has a recent diagnosis of left breast cancer and should follow her outlined treatment plan. Pathology results reported by Roselind Messier, RN on 06/25/2017. Electronically Signed   By: Franki Cabot M.D.   On: 06/25/2017 09:15   Result Date: 06/25/2017 CLINICAL DATA:  Patient with recent biopsy-proven DCIS of the left breast. Patient with additional left breast calcifications presents today for stereotactic biopsy using 3D tomosynthesis guidance. EXAM: LEFT BREAST STEREOTACTIC CORE NEEDLE BIOPSY COMPARISON:  Previous exams. FINDINGS: The patient and I discussed the procedure of stereotactic-guided biopsy including benefits and alternatives. We discussed the high likelihood of a successful procedure. We discussed the risks of the procedure including infection, bleeding, tissue injury, clip migration, and inadequate sampling. Informed written consent was given. The usual time out protocol was performed immediately prior to the procedure. Using sterile technique and 1% Lidocaine as local anesthetic, under stereotactic guidance, a 9 gauge vacuum assisted device was used to perform core needle biopsy of calcifications in the outer left breast, at anterior depth, using a lateral approach. Specimen radiograph was performed showing calcifications. Specimens with calcifications are identified for pathology. Lesion quadrant: Upper outer quadrant At the conclusion of the procedure, a coil shaped tissue marker clip was deployed into the biopsy cavity. Follow-up 2-view mammogram was performed and dictated separately. IMPRESSION: Stereotactic-guided biopsy of calcifications within the outer left breast, at anterior depth. No apparent complications. Electronically Signed: By: Franki Cabot M.D. On: 06/20/2017 11:24   Mr  Aundra Millet Breast Bx Johnella Moloney Dev 1st Lesion Image Bx Spec Mr Guide  Addendum Date: 06/19/2017   ADDENDUM REPORT: 06/19/2017 15:29 ADDENDUM: Pathology revealed HIGH GRADE DUCTAL CARCINOMA IN SITU WITH NECROSIS of the Left breast, central lower. This was found to be concordant by Dr. Everlean Alstrom. Pathology results were discussed with the patient by telephone. The patient reported doing well after the biopsy with tenderness at the site. Post biopsy instructions and care were reviewed and questions were answered. The patient was encouraged to call [facility] for any additional concerns. The patient was referred to [facility] on June 26, 2017. The patient is scheduled for an additional Left breast on June 20, 2017 at [facility]. Pathology results reported by Terie Purser, RN on 06/19/2017. Electronically Signed   By: Everlean Alstrom M.D.   On: 06/19/2017 15:29   Result Date: 06/19/2017 CLINICAL DATA:  67 year old female with history of new onset left nipple inversion and suspicious linear clumped enhancement extending from the nipple in the central lower left breast. EXAM: MRI GUIDED CORE NEEDLE BIOPSY OF THE LEFT BREAST TECHNIQUE: Multiplanar, multisequence MR imaging of the left breast was performed both before and after administration of intravenous contrast. CONTRAST:  12 mL of MultiHance. COMPARISON:  Previous exams. FINDINGS: I met with the patient, and we discussed the procedure of MRI guided biopsy, including risks, benefits, and alternatives. Specifically, we discussed the risks of infection, bleeding, tissue injury, clip migration, and inadequate sampling. Informed, written consent was given. The usual time out protocol was performed immediately prior to the procedure. Using sterile technique, 1% Lidocaine, MRI guidance, and a 9 gauge vacuum assisted device, biopsy was performed of the posterior  aspect of the clumped enhancement in the central lower posterior left breast using a lateral to medial approach. At the conclusion of the procedure, a cylindrical shaped tissue marker clip was deployed into the biopsy cavity. Follow-up 2-view mammogram was performed and dictated separately. IMPRESSION: MRI guided biopsy of the posterior aspect of the suspicious linear clumped enhancement in the left breast. No apparent complications. Electronically Signed: By: Everlean Alstrom M.D. On: 06/17/2017 09:19       IMPRESSION/PLAN: 1. High grade ER/PR negative DCIS of the left breast.Dr. Lisbeth Renshaw discusses the pathology findings and reviews the nature of non invasive breast disease. The consensus from the breast conference include breast conservation with lumpectomy with resection of the nipple, with sentinel mapping. Dr. Lisbeth Renshaw recommends adjuvant external radiotherapy to the breast followed by surveillance. We discussed the risks, benefits, short, and long term effects of radiotherapy, and the patient is interested in proceeding. Dr. Lisbeth Renshaw discusses the delivery and logistics of radiotherapy and anticipates a course of 4 weeks with deep inspiration breath hoid. We will see her back about 2 weeks after surgery to move forward with the simulation and planning process and anticipate starting radiotherapy about 4 weeks after surgery.    The above documentation reflects my direct findings during this shared patient visit. Please see the separate note by Dr. Lisbeth Renshaw on this date for the remainder of the patient's plan of care.    Carola Rhine, PAC

## 2017-06-26 NOTE — Progress Notes (Signed)
[facility] Psychosocial Distress Screening Spiritual Care  Met with Bridget Strong (whose friends call her "Cookie") and husband Bridget Strong (goes by his middle name) in Breast Multidisciplinary Clinic to introduce [facility] team/resources, reviewing distress screen per protocol.  The patient scored a 5 on the Psychosocial Distress Thermometer which indicates moderate distress. Also assessed for distress and other psychosocial needs.   ONCBCN DISTRESS SCREENING 06/26/2017  Screening Type Initial Screening  Distress experienced in past week (1-10) 5  Emotional problem type Adjusting to illness  Physical Problem type Pain  Referral to support programs Yes   Per pt, adjusting to ca dx has been stressful, but [facility] helped reduce her distress to a 3. Couple has been married 50y and draws support from one another. Normalized feelings, providing empathic listening, emotional support, and pastoral reflection.   Follow up needed: No. Couple plans to explore [place] in more detail. Pt plans to consider [facility]. Per couple, no other needs at this time. They know to contact team if needs or questions arise, but please also page if circumstances change. Thank you.   [place], [place], [facility] Pager [phone] Voicemail [phone]

## 2017-07-01 ENCOUNTER — Other Ambulatory Visit: Payer: Self-pay | Admitting: General Surgery

## 2017-07-01 DIAGNOSIS — D0512 Intraductal carcinoma in situ of left breast: Secondary | ICD-10-CM

## 2017-07-02 ENCOUNTER — Encounter (HOSPITAL_COMMUNITY)
Admission: RE | Admit: 2017-07-02 | Discharge: 2017-07-02 | Disposition: A | Discharge status: Home / Self Care | Payer: Medicare Other | Source: Ambulatory Visit | Attending: General Surgery | Admitting: General Surgery

## 2017-07-02 ENCOUNTER — Encounter (HOSPITAL_COMMUNITY): Payer: Self-pay

## 2017-07-02 ENCOUNTER — Other Ambulatory Visit: Payer: Self-pay | Admitting: *Deleted

## 2017-07-02 ENCOUNTER — Other Ambulatory Visit: Payer: Self-pay

## 2017-07-02 DIAGNOSIS — I10 Essential (primary) hypertension: Secondary | ICD-10-CM | POA: Diagnosis not present

## 2017-07-02 DIAGNOSIS — Z803 Family history of malignant neoplasm of breast: Secondary | ICD-10-CM | POA: Diagnosis not present

## 2017-07-02 DIAGNOSIS — D0512 Intraductal carcinoma in situ of left breast: Secondary | ICD-10-CM | POA: Diagnosis not present

## 2017-07-02 DIAGNOSIS — Z90710 Acquired absence of both cervix and uterus: Secondary | ICD-10-CM | POA: Diagnosis not present

## 2017-07-02 HISTORY — DX: Malignant (primary) neoplasm, unspecified: C80.1

## 2017-07-02 NOTE — Progress Notes (Signed)
PCP - Dr. Carolann Littler Cardiologist - denies cardiologist or cardiac workup  EKG - 07/02/2017   Patient denies shortness of breath, fever, cough and chest pain at PAT appointment  Patient verbalized understanding of instructions that were given to them at the PAT appointment. Patient was also instructed that they will need to review over the PAT instructions again at home before surgery.

## 2017-07-02 NOTE — Pre-Procedure Instructions (Signed)
Bridget Strong  07/02/2017      [id number] [place], [place] Strong Phone: [phone] Fax: [phone]    Your procedure is scheduled on Thursday February 21.  Report to [facility] Admitting at 9:00 A.M.  Call this number if you have problems the morning of surgery:  [phone]   Remember:  Do not eat food or drink liquids after midnight.  **Drinke Ensure Pre-surgery drink before leaving home the morning of surgery.**   Take these medicines the morning of surgery with A SIP OF WATER: NONE  7 days prior to surgery STOP taking any Aspirin(unless otherwise instructed by your surgeon), Aleve, Naproxen, Ibuprofen, Motrin, Advil, Goody's, BC's, all herbal medications, fish oil, and all vitamins    Do not wear jewelry, make-up or nail polish.  Do not wear lotions, powders, or perfumes, or deodorant.  Do not shave 48 hours prior to surgery.  Men may shave face and neck.  Do not bring valuables to the hospital.  [facility] is not responsible for any belongings or valuables.  Contacts, dentures or bridgework may not be worn into surgery.  Leave your suitcase in the car.  After surgery it may be brought to your room.  For patients admitted to the hospital, discharge time will be determined by your treatment team.  Patients discharged the day of surgery will not be allowed to drive home.    Special instructions:    [facility]- Preparing For Surgery  Before surgery, you can play an important role. Because skin is not sterile, your skin needs to be as free of germs as possible. You can reduce the number of germs on your skin by washing with CHG (chlorahexidine gluconate) Soap before surgery.  CHG is an antiseptic cleaner which kills germs and bonds with the skin to continue killing germs even after washing.  Please do not use if you have an allergy to CHG or antibacterial soaps. If your skin becomes  reddened/irritated stop using the CHG.  Do not shave (including legs and underarms) for at least 48 hours prior to first CHG shower. It is OK to shave your face.  Please follow these instructions carefully.   1. Shower the NIGHT BEFORE SURGERY and the MORNING OF SURGERY with CHG.   2. If you chose to wash your hair, wash your hair first as usual with your normal shampoo.  3. After you shampoo, rinse your hair and body thoroughly to remove the shampoo.  4. Use CHG as you would any other liquid soap. You can apply CHG directly to the skin and wash gently with a scrungie or a clean washcloth.   5. Apply the CHG Soap to your body ONLY FROM THE NECK DOWN.  Do not use on open wounds or open sores. Avoid contact with your eyes, ears, mouth and genitals (private parts). Wash Face and genitals (private parts)  with your normal soap.  6. Wash thoroughly, paying special attention to the area where your surgery will be performed.  7. Thoroughly rinse your body with warm water from the neck down.  8. DO NOT shower/wash with your normal soap after using and rinsing off the CHG Soap.  9. Pat yourself dry with a CLEAN TOWEL.  10. Wear CLEAN PAJAMAS to bed the night before surgery, wear comfortable clothes the morning of surgery  11. Place CLEAN SHEETS on your bed the night of your first shower  and DO NOT SLEEP WITH PETS.    Day of Surgery: Do not apply any deodorants/lotions. Please wear clean clothes to the hospital/surgery center.      Please read over the following fact sheets that you were given. Coughing and Deep Breathing and Surgical Site Infection Prevention

## 2017-07-03 ENCOUNTER — Encounter: Payer: Self-pay | Admitting: Radiation Oncology

## 2017-07-03 NOTE — Progress Notes (Signed)
Anesthesia Chart Review:  Pt is a 67 year old female scheduled for L breast central lumpectomy with radioactive seed and sentinel lymph node biopsy on 07/04/2017 with Jovita Kussmaul, MD  - PCP is Carolann Littler, MD  PMH includes:  HTN, hyperlipidemia, breast cancer. Never smoker. BMI 22. S/p sacrocolpopexy, culdoplasty, BSO, midureteral sling, LOA 11/23/14.   Medications include: Lisinopril, pravastatin`  BP (!) 121/53   Pulse 74   Temp 36.6 C   Resp 18   Ht 5\' 4"  (1.626 m)   Wt 128 lb 6.4 oz (58.2 kg)   LMP 05/14/1996   SpO2 98%   BMI 22.04 kg/m   Labs from oncology visit 06/26/17 reviewed. CBC and CMET acceptable for surgery  EKG 07/02/17: NSR. LAD. Septal infarct, age undetermined. Appears stable when compared to EKG 11/17/14  If no changes, I anticipate pt can proceed with surgery as scheduled.   Willeen Cass, FNP-BC [facility] Short Stay Surgical Center/Anesthesiology Phone: [phone] 07/03/2017 10:31 AM

## 2017-07-04 ENCOUNTER — Encounter (HOSPITAL_COMMUNITY): Payer: Self-pay | Admitting: Urology

## 2017-07-04 ENCOUNTER — Ambulatory Visit (HOSPITAL_COMMUNITY): Payer: Medicare Other | Admitting: Emergency Medicine

## 2017-07-04 ENCOUNTER — Ambulatory Visit (HOSPITAL_COMMUNITY)
Admission: RE | Admit: 2017-07-04 | Discharge: 2017-07-04 | Disposition: A | Discharge status: Home / Self Care | Payer: Medicare Other | Source: Ambulatory Visit | Attending: General Surgery | Admitting: General Surgery

## 2017-07-04 ENCOUNTER — Ambulatory Visit (HOSPITAL_COMMUNITY): Payer: Medicare Other | Admitting: Anesthesiology

## 2017-07-04 ENCOUNTER — Encounter (HOSPITAL_COMMUNITY)
Admission: RE | Disposition: A | Discharge status: Home / Self Care | Payer: Self-pay | Source: Ambulatory Visit | Attending: General Surgery

## 2017-07-04 ENCOUNTER — Ambulatory Visit
Admission: RE | Admit: 2017-07-04 | Discharge: 2017-07-04 | Disposition: A | Discharge status: Home / Self Care | Payer: Medicare Other | Source: Ambulatory Visit | Attending: General Surgery | Admitting: General Surgery

## 2017-07-04 DIAGNOSIS — Z90710 Acquired absence of both cervix and uterus: Secondary | ICD-10-CM | POA: Insufficient documentation

## 2017-07-04 DIAGNOSIS — D0512 Intraductal carcinoma in situ of left breast: Secondary | ICD-10-CM

## 2017-07-04 DIAGNOSIS — I10 Essential (primary) hypertension: Secondary | ICD-10-CM | POA: Diagnosis not present

## 2017-07-04 DIAGNOSIS — G8918 Other acute postprocedural pain: Secondary | ICD-10-CM | POA: Diagnosis not present

## 2017-07-04 DIAGNOSIS — Z803 Family history of malignant neoplasm of breast: Secondary | ICD-10-CM | POA: Diagnosis not present

## 2017-07-04 DIAGNOSIS — E785 Hyperlipidemia, unspecified: Secondary | ICD-10-CM | POA: Diagnosis not present

## 2017-07-04 HISTORY — PX: BREAST LUMPECTOMY WITH RADIOACTIVE SEED AND SENTINEL LYMPH NODE BIOPSY: SHX6550

## 2017-07-04 SURGERY — BREAST LUMPECTOMY WITH RADIOACTIVE SEED AND SENTINEL LYMPH NODE BIOPSY
Anesthesia: General | Site: Breast | Laterality: Left

## 2017-07-04 MED ORDER — MIDAZOLAM HCL 2 MG/2ML IJ SOLN
2.0000 mg | Freq: Once | INTRAMUSCULAR | Status: AC
  Administered 2017-07-04: 2 mg via INTRAVENOUS

## 2017-07-04 MED ORDER — FENTANYL CITRATE (PF) 100 MCG/2ML IJ SOLN
50.0000 ug | Freq: Once | INTRAMUSCULAR | Status: AC
  Administered 2017-07-04: 50 ug via INTRAVENOUS

## 2017-07-04 MED ORDER — PHENYLEPHRINE 40 MCG/ML (10ML) SYRINGE FOR IV PUSH (FOR BLOOD PRESSURE SUPPORT)
PREFILLED_SYRINGE | INTRAVENOUS | Status: AC
  Filled 2017-07-04: qty 10

## 2017-07-04 MED ORDER — CEFAZOLIN SODIUM-DEXTROSE 2-4 GM/100ML-% IV SOLN
2.0000 g | INTRAVENOUS | Status: AC
  Administered 2017-07-04: 2 g via INTRAVENOUS

## 2017-07-04 MED ORDER — BUPIVACAINE-EPINEPHRINE (PF) 0.5% -1:200000 IJ SOLN
INTRAMUSCULAR | Status: AC
  Filled 2017-07-04: qty 30

## 2017-07-04 MED ORDER — GABAPENTIN 300 MG PO CAPS
ORAL_CAPSULE | ORAL | Status: AC
  Administered 2017-07-04: 300 mg via ORAL
  Filled 2017-07-04: qty 1

## 2017-07-04 MED ORDER — DEXAMETHASONE SODIUM PHOSPHATE 10 MG/ML IJ SOLN
INTRAMUSCULAR | Status: DC | PRN
  Administered 2017-07-04: 8 mg via INTRAVENOUS

## 2017-07-04 MED ORDER — CHLORHEXIDINE GLUCONATE CLOTH 2 % EX PADS: 6.0000 | MEDICATED_PAD | Freq: Once | CUTANEOUS | Status: DC

## 2017-07-04 MED ORDER — CEFAZOLIN SODIUM-DEXTROSE 2-4 GM/100ML-% IV SOLN
INTRAVENOUS | Status: AC
  Filled 2017-07-04: qty 100

## 2017-07-04 MED ORDER — ONDANSETRON HCL 4 MG/2ML IJ SOLN: 4.0000 mg | Freq: Once | INTRAMUSCULAR | Status: DC | PRN

## 2017-07-04 MED ORDER — LIDOCAINE 2% (20 MG/ML) 5 ML SYRINGE
INTRAMUSCULAR | Status: AC
  Filled 2017-07-04: qty 5

## 2017-07-04 MED ORDER — ONDANSETRON HCL 4 MG/2ML IJ SOLN
INTRAMUSCULAR | Status: DC | PRN
  Administered 2017-07-04: 4 mg via INTRAVENOUS

## 2017-07-04 MED ORDER — MIDAZOLAM HCL 2 MG/2ML IJ SOLN
INTRAMUSCULAR | Status: AC
  Administered 2017-07-04: 2 mg via INTRAVENOUS
  Filled 2017-07-04: qty 2

## 2017-07-04 MED ORDER — ACETAMINOPHEN 500 MG PO TABS
ORAL_TABLET | ORAL | Status: AC
  Administered 2017-07-04: 1000 mg via ORAL
  Filled 2017-07-04: qty 2

## 2017-07-04 MED ORDER — LIDOCAINE 2% (20 MG/ML) 5 ML SYRINGE
INTRAMUSCULAR | Status: DC | PRN
  Administered 2017-07-04: 60 mg via INTRAVENOUS

## 2017-07-04 MED ORDER — KETOROLAC TROMETHAMINE 15 MG/ML IJ SOLN: 15.0000 mg | Freq: Once | INTRAMUSCULAR | Status: DC

## 2017-07-04 MED ORDER — KETAMINE HCL 10 MG/ML IJ SOLN
INTRAMUSCULAR | Status: DC | PRN
  Administered 2017-07-04: 10 mg via INTRAVENOUS
  Administered 2017-07-04: 30 mg via INTRAVENOUS

## 2017-07-04 MED ORDER — KETAMINE HCL-SODIUM CHLORIDE 100-0.9 MG/10ML-% IV SOSY
PREFILLED_SYRINGE | INTRAVENOUS | Status: AC
  Filled 2017-07-04: qty 10

## 2017-07-04 MED ORDER — ACETAMINOPHEN 500 MG PO TABS
1000.0000 mg | ORAL_TABLET | ORAL | Status: AC
  Administered 2017-07-04: 1000 mg via ORAL

## 2017-07-04 MED ORDER — ACETAMINOPHEN 325 MG PO TABS: 325.0000 mg | ORAL_TABLET | ORAL | Status: DC | PRN

## 2017-07-04 MED ORDER — FENTANYL CITRATE (PF) 250 MCG/5ML IJ SOLN
INTRAMUSCULAR | Status: AC
  Filled 2017-07-04: qty 5

## 2017-07-04 MED ORDER — OXYCODONE HCL 5 MG PO TABS: 5.0000 mg | ORAL_TABLET | Freq: Four times a day (QID) | ORAL | 0 refills | Status: DC | PRN

## 2017-07-04 MED ORDER — PHENYLEPHRINE 40 MCG/ML (10ML) SYRINGE FOR IV PUSH (FOR BLOOD PRESSURE SUPPORT)
PREFILLED_SYRINGE | INTRAVENOUS | Status: DC | PRN
  Administered 2017-07-04 (×5): 80 ug via INTRAVENOUS
  Administered 2017-07-04: 40 ug via INTRAVENOUS
  Administered 2017-07-04 (×2): 80 ug via INTRAVENOUS

## 2017-07-04 MED ORDER — CELECOXIB 200 MG PO CAPS
ORAL_CAPSULE | ORAL | Status: AC
  Administered 2017-07-04: 200 mg via ORAL
  Filled 2017-07-04: qty 1

## 2017-07-04 MED ORDER — LACTATED RINGERS IV SOLN
INTRAVENOUS | Status: DC
  Administered 2017-07-04 (×2): via INTRAVENOUS

## 2017-07-04 MED ORDER — GABAPENTIN 300 MG PO CAPS
300.0000 mg | ORAL_CAPSULE | ORAL | Status: AC
  Administered 2017-07-04: 300 mg via ORAL

## 2017-07-04 MED ORDER — PROPOFOL 10 MG/ML IV BOLUS
INTRAVENOUS | Status: DC | PRN
  Administered 2017-07-04: 150 mg via INTRAVENOUS

## 2017-07-04 MED ORDER — EPHEDRINE 5 MG/ML INJ
INTRAVENOUS | Status: AC
  Filled 2017-07-04: qty 10

## 2017-07-04 MED ORDER — ACETAMINOPHEN 160 MG/5ML PO SOLN: 325.0000 mg | ORAL | Status: DC | PRN

## 2017-07-04 MED ORDER — FENTANYL CITRATE (PF) 100 MCG/2ML IJ SOLN
INTRAMUSCULAR | Status: AC
  Administered 2017-07-04: 50 ug via INTRAVENOUS
  Filled 2017-07-04: qty 2

## 2017-07-04 MED ORDER — FENTANYL CITRATE (PF) 100 MCG/2ML IJ SOLN
INTRAMUSCULAR | Status: AC
  Filled 2017-07-04: qty 2

## 2017-07-04 MED ORDER — ONDANSETRON HCL 4 MG/2ML IJ SOLN
INTRAMUSCULAR | Status: AC
  Filled 2017-07-04: qty 2

## 2017-07-04 MED ORDER — SODIUM CHLORIDE 0.9 % IJ SOLN
INTRAMUSCULAR | Status: AC
  Filled 2017-07-04: qty 10

## 2017-07-04 MED ORDER — CELECOXIB 200 MG PO CAPS
200.0000 mg | ORAL_CAPSULE | ORAL | Status: AC
  Administered 2017-07-04: 200 mg via ORAL

## 2017-07-04 MED ORDER — 0.9 % SODIUM CHLORIDE (POUR BTL) OPTIME
TOPICAL | Status: DC | PRN
  Administered 2017-07-04: 1000 mL

## 2017-07-04 MED ORDER — FENTANYL CITRATE (PF) 100 MCG/2ML IJ SOLN: 50.0000 ug | Freq: Once | INTRAMUSCULAR | Status: DC

## 2017-07-04 MED ORDER — ROCURONIUM BROMIDE 10 MG/ML (PF) SYRINGE
PREFILLED_SYRINGE | INTRAVENOUS | Status: AC
  Filled 2017-07-04: qty 5

## 2017-07-04 MED ORDER — ROPIVACAINE HCL 5 MG/ML IJ SOLN
INTRAMUSCULAR | Status: DC | PRN
  Administered 2017-07-04 (×6): 5 mL via PERINEURAL

## 2017-07-04 MED ORDER — BUPIVACAINE-EPINEPHRINE 0.5% -1:200000 IJ SOLN
INTRAMUSCULAR | Status: DC | PRN
  Administered 2017-07-04: 18 mL

## 2017-07-04 MED ORDER — FENTANYL CITRATE (PF) 250 MCG/5ML IJ SOLN
INTRAMUSCULAR | Status: DC | PRN
  Administered 2017-07-04: 25 ug via INTRAVENOUS

## 2017-07-04 MED ORDER — TECHNETIUM TC 99M SULFUR COLLOID FILTERED
1.0000 | Freq: Once | INTRAVENOUS | Status: AC | PRN
  Administered 2017-07-04: 1 via INTRADERMAL

## 2017-07-04 MED ORDER — FENTANYL CITRATE (PF) 100 MCG/2ML IJ SOLN
25.0000 ug | INTRAMUSCULAR | Status: DC | PRN
  Administered 2017-07-04 (×2): 25 ug via INTRAVENOUS

## 2017-07-04 MED ORDER — MEPERIDINE HCL 50 MG/ML IJ SOLN: 6.2500 mg | INTRAMUSCULAR | Status: DC | PRN

## 2017-07-04 MED ORDER — DEXAMETHASONE SODIUM PHOSPHATE 10 MG/ML IJ SOLN
INTRAMUSCULAR | Status: AC
  Filled 2017-07-04: qty 1

## 2017-07-04 MED ORDER — METHYLENE BLUE 0.5 % INJ SOLN
INTRAVENOUS | Status: AC
  Filled 2017-07-04: qty 10

## 2017-07-04 SURGICAL SUPPLY — 51 items
ADH SKN CLS APL DERMABOND .7 (GAUZE/BANDAGES/DRESSINGS) ×1
APPLIER CLIP 9.375 MED OPEN (MISCELLANEOUS) ×3
APR CLP MED 9.3 20 MLT OPN (MISCELLANEOUS) ×1
BLADE SURG 15 STRL LF DISP TIS (BLADE) ×1 IMPLANT
BLADE SURG 15 STRL SS (BLADE) ×3
CANISTER SUCT 3000ML PPV (MISCELLANEOUS) ×3 IMPLANT
CHLORAPREP W/TINT 26ML (MISCELLANEOUS) ×3 IMPLANT
CLIP APPLIE 9.375 MED OPEN (MISCELLANEOUS) ×1 IMPLANT
CONT SPEC 4OZ CLIKSEAL STRL BL (MISCELLANEOUS) ×5 IMPLANT
COVER PROBE W GEL 5X96 (DRAPES) ×3 IMPLANT
COVER SURGICAL LIGHT HANDLE (MISCELLANEOUS) ×3 IMPLANT
DERMABOND ADVANCED (GAUZE/BANDAGES/DRESSINGS) ×2
DERMABOND ADVANCED .7 DNX12 (GAUZE/BANDAGES/DRESSINGS) ×1 IMPLANT
DEVICE DUBIN SPECIMEN MAMMOGRA (MISCELLANEOUS) ×2 IMPLANT
DRAPE CHEST BREAST 15X10 FENES (DRAPES) ×3 IMPLANT
DRAPE UTILITY XL STRL (DRAPES) ×3 IMPLANT
ELECT COATED BLADE 2.86 ST (ELECTRODE) ×3 IMPLANT
ELECT REM PT RETURN 9FT ADLT (ELECTROSURGICAL) ×3
ELECTRODE REM PT RTRN 9FT ADLT (ELECTROSURGICAL) ×1 IMPLANT
GLOVE BIO SURGEON STRL SZ 6 (GLOVE) ×2 IMPLANT
GLOVE BIO SURGEON STRL SZ7 (GLOVE) ×6 IMPLANT
GLOVE BIO SURGEON STRL SZ7.5 (GLOVE) ×5 IMPLANT
GLOVE BIOGEL PI IND STRL 7.0 (GLOVE) IMPLANT
GLOVE BIOGEL PI IND STRL 7.5 (GLOVE) IMPLANT
GLOVE BIOGEL PI INDICATOR 7.0 (GLOVE) ×6
GLOVE BIOGEL PI INDICATOR 7.5 (GLOVE) ×2
GOWN STRL REUS W/ TWL LRG LVL3 (GOWN DISPOSABLE) ×2 IMPLANT
GOWN STRL REUS W/TWL LRG LVL3 (GOWN DISPOSABLE) ×15
KIT BASIN OR (CUSTOM PROCEDURE TRAY) ×3 IMPLANT
KIT MARKER MARGIN INK (KITS) ×3 IMPLANT
LIGHT WAVEGUIDE WIDE FLAT (MISCELLANEOUS) IMPLANT
NDL FILTER BLUNT 18X1 1/2 (NEEDLE) IMPLANT
NDL HYPO 25GX1X1/2 BEV (NEEDLE) ×1 IMPLANT
NDL SAFETY ECLIPSE 18X1.5 (NEEDLE) IMPLANT
NEEDLE FILTER BLUNT 18X 1/2SAF (NEEDLE)
NEEDLE FILTER BLUNT 18X1 1/2 (NEEDLE) IMPLANT
NEEDLE HYPO 18GX1.5 SHARP (NEEDLE)
NEEDLE HYPO 25GX1X1/2 BEV (NEEDLE) ×3 IMPLANT
NS IRRIG 1000ML POUR BTL (IV SOLUTION) ×3 IMPLANT
PACK SURGICAL SETUP 50X90 (CUSTOM PROCEDURE TRAY) ×3 IMPLANT
PENCIL BUTTON HOLSTER BLD 10FT (ELECTRODE) ×3 IMPLANT
SPONGE LAP 18X18 X RAY DECT (DISPOSABLE) ×3 IMPLANT
SUT MNCRL AB 4-0 PS2 18 (SUTURE) ×6 IMPLANT
SUT VIC AB 3-0 SH 18 (SUTURE) ×3 IMPLANT
SYR BULB 3OZ (MISCELLANEOUS) ×3 IMPLANT
SYR CONTROL 10ML LL (SYRINGE) ×3 IMPLANT
TOWEL OR 17X24 6PK STRL BLUE (TOWEL DISPOSABLE) ×3 IMPLANT
TOWEL OR 17X26 10 PK STRL BLUE (TOWEL DISPOSABLE) ×3 IMPLANT
TUBE CONNECTING 12'X1/4 (SUCTIONS) ×1
TUBE CONNECTING 12X1/4 (SUCTIONS) ×2 IMPLANT
YANKAUER SUCT BULB TIP NO VENT (SUCTIONS) ×3 IMPLANT

## 2017-07-04 NOTE — Interval H&P Note (Signed)
History and Physical Interval Note:  07/04/2017 10:42 AM  Bridget Strong  has presented today for surgery, with the diagnosis of left breast dcis  The various methods of treatment have been discussed with the patient and family. After consideration of risks, benefits and other options for treatment, the patient has consented to  Procedure(s): LEFT BREAST CENTRAL LUMPECTOMY WITH RADIOACTIVE SEED AND SENTINEL LYMPH NODE BIOPSY (Left) as a surgical intervention .  The patient's history has been reviewed, patient examined, no change in status, stable for surgery.  I have reviewed the patient's chart and labs.  Questions were answered to the patient's satisfaction.     TOTH III,Ardella Chhim S

## 2017-07-04 NOTE — Anesthesia Procedure Notes (Signed)
Procedure Name: LMA Insertion Date/Time: 07/04/2017 11:43 AM Performed by: Freddie Breech, CRNA Pre-anesthesia Checklist: Patient identified, Emergency Drugs available, Suction available and Patient being monitored Patient Re-evaluated:Patient Re-evaluated prior to induction Oxygen Delivery Method: Circle System Utilized Preoxygenation: Pre-oxygenation with 100% oxygen Induction Type: IV induction Ventilation: Mask ventilation without difficulty LMA: LMA inserted LMA Size: 3.0 Number of attempts: 1 Airway Equipment and Method: Bite block Placement Confirmation: positive ETCO2 Tube secured with: Tape Dental Injury: Teeth and Oropharynx as per pre-operative assessment

## 2017-07-04 NOTE — Op Note (Signed)
07/04/2017  1:03 PM  PATIENT:  Bridget Strong  67 y.o. female  PRE-OPERATIVE DIAGNOSIS:  LEFT BREAST DUCTAL CARCINOMA IN SITU  POST-OPERATIVE DIAGNOSIS:  LEFT BREAST DUCTAL CARCINOMA IN SITU  PROCEDURE:  Procedure(s): RADIOACTIVE SEED GUIDED LEFT BREAST CENTRAL LUMPECTOMY AND LEFT DEEP AXILLARY SENTINEL LYMPH NODE BIOPSY (Left)  SURGEON:  Surgeon(s) and Role:    * Jovita Kussmaul, MD - Primary  PHYSICIAN ASSISTANT:   ASSISTANTS: none   ANESTHESIA:   local and general  EBL:  20 mL   BLOOD ADMINISTERED:none  DRAINS: none   LOCAL MEDICATIONS USED:  MARCAINE     SPECIMEN:  Source of Specimen:  left breast tissue and sentinel nodes X 2  DISPOSITION OF SPECIMEN:  PATHOLOGY  COUNTS:  YES  TOURNIQUET:  * No tourniquets in log *  DICTATION: .Dragon Dictation   After informed consent was obtained the patient was brought to the operating room and placed in the supine position on the operating table.  After adequate induction of general anesthesia the patient's left chest, breast, and axillary area were prepped with ChloraPrep, allowed to dry, and draped in usual sterile manner.  An appropriate timeout was performed.  Previously an I-125 seed was placed centrally in the left breast to mark an area of ductal carcinoma in situ.  Earlier in the day the patient underwent injection 1 mCi of technetium sulfur colloid in the subareolar position on the left.  The neoprobe was set to technetium in an area of radioactivity was readily identified in the left axilla.  This area was infiltrated with quarter percent Marcaine.  A small transversely oriented incision was made with a 15 blade knife.  The incision was carried through the skin and subcutaneous tissue sharply with electrocautery until the deep left axillary space was entered.  The neoprobe was used to direct blunt hemostat dissection until I was able to identify 2 hot lymph nodes.  These were excised sharply with the electrocautery and the  lymphatics were controlled with clips.  Ex vivo counts on these nodes were approximately 200 and 50.  These were sent as sentinel nodes numbers 1 and 2.  No other hot or palpable lymph nodes were identified in the left axilla.  The deep layer of the wound was closed with interrupted 3-0 Vicryl stitches.  The skin was then closed with a running 4-0 Monocryl subcuticular stitch.  Attention was then turned to the left breast.  The patient had a large central area of enhancement.  The seed was placed to mark the deep edge of the enhancement.  An elliptical incision was made around the nipple and areole a complex for a central lumpectomy.  The incision was carried through the skin and subcutaneous tissue sharply with electrocautery.  The dissection was then carried straight back to the chest wall making sure that the seed was incorporated into the specimen.  The dissection was carried all the way to the chest wall.  Once the specimen was removed it was oriented with the appropriate paint colors.  A specimen radiograph was obtained that showed the 2 clips and seed to be in the specimen.  The specimen was then sent to pathology for further evaluation.  An additional lateral margin was excised sharply with the electrocautery and marked appropriately and sent to pathology.  Hemostasis was achieved using the Bovie electrocautery.  The cavity was marked with clips.  The wound was irrigated with saline and infiltrated with quarter percent Marcaine.  The deep layer  of the wound was then closed with layers of interrupted 3-0 Vicryl stitches.  The skin was then closed with a running 4-0 Monocryl subcuticular stitch.  Dermabond dressings were applied.  The patient tolerated the procedure well.  At the end of the case all needle sponge and instrument counts were correct.  The patient was then awakened and taken to recovery in stable condition.  PLAN OF CARE: Discharge to home after PACU  PATIENT DISPOSITION:  PACU -  hemodynamically stable.   Delay start of Pharmacological VTE agent (>24hrs) due to surgical blood loss or risk of bleeding: not applicable

## 2017-07-04 NOTE — Anesthesia Preprocedure Evaluation (Signed)
Anesthesia Evaluation  Patient identified by MRN, date of birth, ID band Patient awake    Reviewed: Allergy & Precautions, NPO status , Patient's Chart, lab work & pertinent test results  Airway Mallampati: I       Dental  (+) Teeth Intact   Pulmonary    Pulmonary exam normal breath sounds clear to auscultation       Cardiovascular hypertension, Pt. on medications Normal cardiovascular exam Rhythm:Regular Rate:Normal     Neuro/Psych negative neurological ROS  negative psych ROS   GI/Hepatic negative GI ROS, Neg liver ROS,   Endo/Other  negative endocrine ROS  Renal/GU negative Renal ROS  negative genitourinary   Musculoskeletal negative musculoskeletal ROS (+)   Abdominal Normal abdominal exam  (+)   Peds  Hematology negative hematology ROS (+)   Anesthesia Other Findings   Reproductive/Obstetrics                             Anesthesia Physical Anesthesia Plan  ASA: II  Anesthesia Plan: General   Post-op Pain Management:    Induction: Intravenous  PONV Risk Score and Plan: 3 and Ondansetron and Dexamethasone  Airway Management Planned: LMA  Additional Equipment:   Intra-op Plan:   Post-operative Plan:   Informed Consent: I have reviewed the patients History and Physical, chart, labs and discussed the procedure including the risks, benefits and alternatives for the proposed anesthesia with the patient or authorized representative who has indicated his/her understanding and acceptance.     Plan Discussed with: CRNA and Surgeon  Anesthesia Plan Comments:         Anesthesia Quick Evaluation

## 2017-07-04 NOTE — Transfer of Care (Signed)
Immediate Anesthesia Transfer of Care Note  Patient: Bridget Strong  Procedure(s) Performed: RADIOACTIVE SEED GUIDED LEFT BREAST LUMPECTOMY, LEFT AXILLARY SENTINEL LYMPH NODE BIOPSY (Left Breast)  Patient Location: PACU  Anesthesia Type:GA combined with regional for post-op pain  Level of Consciousness: drowsy and patient cooperative  Airway & Oxygen Therapy: Patient Spontanous Breathing and Patient connected to face mask oxygen  Post-op Assessment: Report given to RN and Post -op Vital signs reviewed and stable  Post vital signs: Reviewed and stable  Last Vitals:  Vitals:   07/04/17 0915  BP: (!) 137/51  Pulse: 68  Temp: 36.6 C  SpO2: 100%    Last Pain:  Vitals:   07/04/17 0915  TempSrc: Oral         Complications: No apparent anesthesia complications

## 2017-07-04 NOTE — Anesthesia Procedure Notes (Addendum)
Anesthesia Regional Block: Pectoralis block   Pre-Anesthetic Checklist: ,, timeout performed, Correct Patient, Correct Site, Correct Laterality, Correct Procedure, Correct Position, site marked, Risks and benefits discussed,  Surgical consent,  Pre-op evaluation,  At surgeon's request and post-op pain management  Laterality: Left  Prep: chloraprep       Needles:  Injection technique: Single-shot  Needle Type: Echogenic Stimulator Needle     Needle Length: 9cm  Needle Gauge: 21   Needle insertion depth: 2 cm   Additional Needles:   Procedures:,,,, ultrasound used (permanent image in chart),,,,  Narrative:  Start time: 07/04/2017 11:15 AM End time: 07/04/2017 11:24 AM Injection made incrementally with aspirations every 5 mL.  Performed by: Personally  Anesthesiologist: Lyn Hollingshead, MD

## 2017-07-04 NOTE — Anesthesia Postprocedure Evaluation (Signed)
Anesthesia Post Note  Patient: Bridget Strong  Procedure(s) Performed: RADIOACTIVE SEED GUIDED LEFT BREAST LUMPECTOMY, LEFT AXILLARY SENTINEL LYMPH NODE BIOPSY (Left Breast)     Patient location during evaluation: PACU Anesthesia Type: General Level of consciousness: awake and sedated Pain management: pain level controlled Vital Signs Assessment: post-procedure vital signs reviewed and stable Respiratory status: spontaneous breathing Cardiovascular status: stable Postop Assessment: no apparent nausea or vomiting Anesthetic complications: no    Last Vitals:  Vitals:   07/04/17 1319 07/04/17 1332  BP:  (!) 150/63  Pulse: 61 64  Resp: 17 19  Temp:    SpO2: 100% 95%    Last Pain:  Vitals:   07/04/17 1345  TempSrc:   PainSc: 8    Pain Goal:                 Desta Bujak JR,JOHN Yasheka Fossett

## 2017-07-04 NOTE — H&P (Signed)
Bridget Strong  Location: [facility] Surgery Patient #: [id number] DOB: 08-Mar-1951 Undefined / Language: Bridget Strong / Race: White Female   History of Present Illness  The patient is a 67 year old female who presents with breast cancer. we are asked to see the patient in consultation by Dr. Lisbeth Renshaw to evaluate her for a new left breast cancer. The patient is a 67 year old white female who presents with screen detected calcifications of the left breast with nipple inversion for the last month. The calcifications span an area of 6 cm straight back from the nipple. The shallow biopsy was negative but the deep biopsy showed ductal carcinoma in situ that was ER and PR negative. An MRI was also performed that showed enhancement from the nipple straight back for an area of 6 cm. She denies any breast pain or discharge from the nipple. She does have a history of breast cancer and a maternal grandmother   Past Surgical History  Breast Biopsy  Left. Gallbladder Surgery - Laparoscopic  Hysterectomy (not due to cancer) - Partial   Diagnostic Studies History  Colonoscopy  5-10 years ago Mammogram  within last year Pap Smear  1-5 years ago  Medication History  Medications Reconciled  Social History  Caffeine use  Carbonated beverages, Coffee, Tea. No alcohol use  No drug use  Tobacco use  Never smoker.  Family History Alcohol Abuse  Father. Cancer  Brother. Cerebrovascular Accident  Father. Hypertension  Father.  Other Problems  Bladder Problems  Cholelithiasis  General anesthesia - complications  High blood pressure  Hypercholesterolemia  Oophorectomy  Bilateral. Pancreatitis     Review of Systems General Not Present- Appetite Loss, Chills, Fatigue, Fever, Night Sweats, Weight Gain and Weight Loss. Skin Not Present- Change in Wart/Mole, Dryness, Hives, Jaundice, New Lesions, Non-Healing Wounds, Rash and Ulcer. HEENT Not Present- Earache, Hearing Loss,  Hoarseness, Nose Bleed, Oral Ulcers, Ringing in the Ears, Seasonal Allergies, Sinus Pain, Sore Throat, Visual Disturbances, Wears glasses/contact lenses and Yellow Eyes. Respiratory Not Present- Bloody sputum, Chronic Cough, Difficulty Breathing, Snoring and Wheezing. Breast Present- Breast Mass. Not Present- Breast Pain, Nipple Discharge and Skin Changes. Cardiovascular Not Present- Chest Pain, Difficulty Breathing Lying Down, Leg Cramps, Palpitations, Rapid Heart Rate, Shortness of Breath and Swelling of Extremities. Gastrointestinal Not Present- Abdominal Pain, Bloating, Bloody Stool, Change in Bowel Habits, Chronic diarrhea, Constipation, Difficulty Swallowing, Excessive gas, Gets full quickly at meals, Hemorrhoids, Indigestion, Nausea, Rectal Pain and Vomiting. Female Genitourinary Not Present- Frequency, Nocturia, Painful Urination, Pelvic Pain and Urgency. Musculoskeletal Not Present- Back Pain, Joint Pain, Joint Stiffness, Muscle Pain, Muscle Weakness and Swelling of Extremities. Neurological Not Present- Decreased Memory, Fainting, Headaches, Numbness, Seizures, Tingling, Tremor, Trouble walking and Weakness. Psychiatric Not Present- Anxiety, Bipolar, Change in Sleep Pattern, Depression, Fearful and Frequent crying. Endocrine Not Present- Cold Intolerance, Excessive Hunger, Hair Changes, Heat Intolerance, Hot flashes and New Diabetes. Hematology Present- Excessive bleeding. Not Present- Blood Thinners, Easy Bruising, Gland problems, HIV and Persistent Infections.   Physical Exam  General Mental Status-Alert. General Appearance-Consistent with stated age. Hydration-Well hydrated. Voice-Normal.  Head and Neck Head-normocephalic, atraumatic with no lesions or palpable masses. Trachea-midline. Thyroid Gland Characteristics - normal size and consistency.  Eye Eyeball - Bilateral-Extraocular movements intact. Sclera/Conjunctiva - Bilateral-No scleral icterus.  Chest  and Lung Exam Chest and lung exam reveals -quiet, even and easy respiratory effort with no use of accessory muscles and on auscultation, normal breath sounds, no adventitious sounds and normal vocal resonance. Inspection Chest Wall -  Normal. Back - normal.  Breast Note: there is a central bruise of the left breast but no palpable mass. There is no palpable mass of the right breast. There is no palpable axillary, supraclavicular, or cervical lymphadenopathy   Cardiovascular Cardiovascular examination reveals -normal heart sounds, regular rate and rhythm with no murmurs and normal pedal pulses bilaterally.  Abdomen Inspection Inspection of the abdomen reveals - No Hernias. Skin - Scar - no surgical scars. Palpation/Percussion Palpation and Percussion of the abdomen reveal - Soft, Non Tender, No Rebound tenderness, No Rigidity (guarding) and No hepatosplenomegaly. Auscultation Auscultation of the abdomen reveals - Bowel sounds normal.  Neurologic Neurologic evaluation reveals -alert and oriented x 3 with no impairment of recent or remote memory. Mental Status-Normal.  Musculoskeletal Normal Exam - Left-Upper Extremity Strength Normal and Lower Extremity Strength Normal. Normal Exam - Right-Upper Extremity Strength Normal and Lower Extremity Strength Normal.  Lymphatic Head & Neck  General Head & Neck Lymphatics: Bilateral - Description - Normal. Axillary  General Axillary Region: Bilateral - Description - Normal. Tenderness - Non Tender. Femoral & Inguinal  Generalized Femoral & Inguinal Lymphatics: Bilateral - Description - Normal. Tenderness - Non Tender.    Assessment & Plan  DUCTAL CARCINOMA IN SITU (DCIS) OF LEFT BREAST (D05.12) Impression: the patient appears to have an area of ductal carcinoma in situ deep to the left nipple. Even though the shallow biopsy was negative there is a high likelihood that there is residual disease communicating with the nipple.  I have talked to her about the different options for treatment and she favors breast conservation. This would need to be done with a central lumpectomy where she would lose the nipple and areola. I have discussed this with her in detail including the risks and benefits of surgery as well as some of the technical aspects and she understands and wishes to proceed. We would also plan for a sentinel node biopsy. I would plan to localize the deeper area with a radioactive seed

## 2017-07-05 ENCOUNTER — Encounter (HOSPITAL_COMMUNITY): Payer: Self-pay | Admitting: General Surgery

## 2017-07-16 ENCOUNTER — Ambulatory Visit: Payer: Self-pay | Admitting: General Surgery

## 2017-07-25 ENCOUNTER — Institutional Professional Consult (permissible substitution): Payer: Self-pay | Admitting: Radiation Oncology

## 2017-07-29 ENCOUNTER — Encounter (HOSPITAL_BASED_OUTPATIENT_CLINIC_OR_DEPARTMENT_OTHER): Payer: Self-pay | Admitting: *Deleted

## 2017-08-01 NOTE — Progress Notes (Signed)
Ensure pre surgery drink given with instructions, pt verbalized understanding. 

## 2017-08-05 ENCOUNTER — Encounter (HOSPITAL_BASED_OUTPATIENT_CLINIC_OR_DEPARTMENT_OTHER)
Admission: RE | Disposition: A | Discharge status: Home / Self Care | Payer: Self-pay | Source: Ambulatory Visit | Attending: General Surgery

## 2017-08-05 ENCOUNTER — Encounter (HOSPITAL_BASED_OUTPATIENT_CLINIC_OR_DEPARTMENT_OTHER): Payer: Self-pay

## 2017-08-05 ENCOUNTER — Ambulatory Visit (HOSPITAL_BASED_OUTPATIENT_CLINIC_OR_DEPARTMENT_OTHER): Payer: Medicare Other | Admitting: Anesthesiology

## 2017-08-05 ENCOUNTER — Ambulatory Visit (HOSPITAL_BASED_OUTPATIENT_CLINIC_OR_DEPARTMENT_OTHER)
Admission: RE | Admit: 2017-08-05 | Discharge: 2017-08-05 | Disposition: A | Discharge status: Home / Self Care | Payer: Medicare Other | Source: Ambulatory Visit | Attending: General Surgery | Admitting: General Surgery

## 2017-08-05 ENCOUNTER — Other Ambulatory Visit: Payer: Self-pay

## 2017-08-05 DIAGNOSIS — I10 Essential (primary) hypertension: Secondary | ICD-10-CM | POA: Diagnosis not present

## 2017-08-05 DIAGNOSIS — K859 Acute pancreatitis without necrosis or infection, unspecified: Secondary | ICD-10-CM | POA: Diagnosis not present

## 2017-08-05 DIAGNOSIS — D0512 Intraductal carcinoma in situ of left breast: Secondary | ICD-10-CM | POA: Diagnosis not present

## 2017-08-05 DIAGNOSIS — E7800 Pure hypercholesterolemia, unspecified: Secondary | ICD-10-CM | POA: Insufficient documentation

## 2017-08-05 DIAGNOSIS — Z79899 Other long term (current) drug therapy: Secondary | ICD-10-CM | POA: Insufficient documentation

## 2017-08-05 DIAGNOSIS — E785 Hyperlipidemia, unspecified: Secondary | ICD-10-CM | POA: Diagnosis not present

## 2017-08-05 HISTORY — PX: RE-EXCISION OF BREAST LUMPECTOMY: SHX6048

## 2017-08-05 SURGERY — EXCISION, LESION, BREAST
Anesthesia: General | Site: Breast | Laterality: Left

## 2017-08-05 MED ORDER — SCOPOLAMINE 1 MG/3DAYS TD PT72: 1.0000 | MEDICATED_PATCH | Freq: Once | TRANSDERMAL | Status: DC | PRN

## 2017-08-05 MED ORDER — LACTATED RINGERS IV SOLN
INTRAVENOUS | Status: DC
  Administered 2017-08-05: 10 mL/h via INTRAVENOUS
  Administered 2017-08-05: 11:00:00 via INTRAVENOUS

## 2017-08-05 MED ORDER — CHLORHEXIDINE GLUCONATE CLOTH 2 % EX PADS: 6.0000 | MEDICATED_PAD | Freq: Once | CUTANEOUS | Status: DC

## 2017-08-05 MED ORDER — METOCLOPRAMIDE HCL 5 MG/ML IJ SOLN: 10.0000 mg | Freq: Once | INTRAMUSCULAR | Status: DC | PRN

## 2017-08-05 MED ORDER — ACETAMINOPHEN 500 MG PO TABS
ORAL_TABLET | ORAL | Status: AC
  Filled 2017-08-05: qty 2

## 2017-08-05 MED ORDER — FENTANYL CITRATE (PF) 100 MCG/2ML IJ SOLN
25.0000 ug | INTRAMUSCULAR | Status: DC | PRN
  Administered 2017-08-05: 50 ug via INTRAVENOUS
  Administered 2017-08-05: 25 ug via INTRAVENOUS

## 2017-08-05 MED ORDER — CEFAZOLIN SODIUM-DEXTROSE 2-4 GM/100ML-% IV SOLN
2.0000 g | INTRAVENOUS | Status: AC
  Administered 2017-08-05: 2 g via INTRAVENOUS

## 2017-08-05 MED ORDER — LIDOCAINE HCL (CARDIAC) 20 MG/ML IV SOLN
INTRAVENOUS | Status: AC
  Filled 2017-08-05: qty 5

## 2017-08-05 MED ORDER — GABAPENTIN 300 MG PO CAPS
ORAL_CAPSULE | ORAL | Status: AC
  Filled 2017-08-05: qty 1

## 2017-08-05 MED ORDER — PROPOFOL 10 MG/ML IV BOLUS
INTRAVENOUS | Status: AC
  Filled 2017-08-05: qty 20

## 2017-08-05 MED ORDER — CELECOXIB 200 MG PO CAPS
ORAL_CAPSULE | ORAL | Status: AC
  Filled 2017-08-05: qty 1

## 2017-08-05 MED ORDER — LIDOCAINE-EPINEPHRINE (PF) 1 %-1:200000 IJ SOLN
INTRAMUSCULAR | Status: DC | PRN
  Administered 2017-08-05: 20 mL

## 2017-08-05 MED ORDER — DEXAMETHASONE SODIUM PHOSPHATE 4 MG/ML IJ SOLN
INTRAMUSCULAR | Status: DC | PRN
  Administered 2017-08-05: 10 mg via INTRAVENOUS

## 2017-08-05 MED ORDER — LIDOCAINE HCL (CARDIAC) 20 MG/ML IV SOLN
INTRAVENOUS | Status: DC | PRN
  Administered 2017-08-05: 60 mg via INTRAVENOUS

## 2017-08-05 MED ORDER — CEFAZOLIN SODIUM-DEXTROSE 2-4 GM/100ML-% IV SOLN
INTRAVENOUS | Status: AC
  Filled 2017-08-05: qty 100

## 2017-08-05 MED ORDER — MEPERIDINE HCL 25 MG/ML IJ SOLN: 6.2500 mg | INTRAMUSCULAR | Status: DC | PRN

## 2017-08-05 MED ORDER — MIDAZOLAM HCL 2 MG/2ML IJ SOLN
INTRAMUSCULAR | Status: AC
  Filled 2017-08-05: qty 2

## 2017-08-05 MED ORDER — FENTANYL CITRATE (PF) 100 MCG/2ML IJ SOLN
INTRAMUSCULAR | Status: AC
Start: 2017-08-05 — End: 2017-08-05
  Filled 2017-08-05: qty 2

## 2017-08-05 MED ORDER — PROPOFOL 10 MG/ML IV BOLUS
INTRAVENOUS | Status: DC | PRN
  Administered 2017-08-05: 130 mg via INTRAVENOUS

## 2017-08-05 MED ORDER — ACETAMINOPHEN 500 MG PO TABS
1000.0000 mg | ORAL_TABLET | ORAL | Status: AC
  Administered 2017-08-05: 1000 mg via ORAL

## 2017-08-05 MED ORDER — MIDAZOLAM HCL 2 MG/2ML IJ SOLN
1.0000 mg | INTRAMUSCULAR | Status: DC | PRN
  Administered 2017-08-05: 2 mg via INTRAVENOUS

## 2017-08-05 MED ORDER — DEXAMETHASONE SODIUM PHOSPHATE 10 MG/ML IJ SOLN
INTRAMUSCULAR | Status: AC
  Filled 2017-08-05: qty 1

## 2017-08-05 MED ORDER — FENTANYL CITRATE (PF) 100 MCG/2ML IJ SOLN
50.0000 ug | INTRAMUSCULAR | Status: DC | PRN
  Administered 2017-08-05: 50 ug via INTRAVENOUS

## 2017-08-05 MED ORDER — CELECOXIB 200 MG PO CAPS
200.0000 mg | ORAL_CAPSULE | ORAL | Status: AC
  Administered 2017-08-05: 200 mg via ORAL

## 2017-08-05 MED ORDER — LIDOCAINE-EPINEPHRINE 1 %-1:100000 IJ SOLN
INTRAMUSCULAR | Status: AC
  Filled 2017-08-05: qty 1

## 2017-08-05 MED ORDER — GABAPENTIN 300 MG PO CAPS
300.0000 mg | ORAL_CAPSULE | ORAL | Status: AC
  Administered 2017-08-05: 300 mg via ORAL

## 2017-08-05 MED ORDER — BUPIVACAINE-EPINEPHRINE (PF) 0.25% -1:200000 IJ SOLN
INTRAMUSCULAR | Status: AC
  Filled 2017-08-05: qty 30

## 2017-08-05 MED ORDER — ONDANSETRON HCL 4 MG/2ML IJ SOLN
INTRAMUSCULAR | Status: AC
  Filled 2017-08-05: qty 2

## 2017-08-05 MED ORDER — BUPIVACAINE HCL (PF) 0.25 % IJ SOLN
INTRAMUSCULAR | Status: AC
  Filled 2017-08-05: qty 30

## 2017-08-05 MED ORDER — FENTANYL CITRATE (PF) 100 MCG/2ML IJ SOLN
INTRAMUSCULAR | Status: AC
  Filled 2017-08-05: qty 2

## 2017-08-05 SURGICAL SUPPLY — 45 items
ADH SKN CLS APL DERMABOND .7 (GAUZE/BANDAGES/DRESSINGS) ×1
APPLIER CLIP 9.375 MED OPEN (MISCELLANEOUS)
APR CLP MED 9.3 20 MLT OPN (MISCELLANEOUS)
BLADE SURG 15 STRL LF DISP TIS (BLADE) ×1 IMPLANT
BLADE SURG 15 STRL SS (BLADE) ×3
CANISTER SUCT 1200ML W/VALVE (MISCELLANEOUS) ×3 IMPLANT
CHLORAPREP W/TINT 26ML (MISCELLANEOUS) ×3 IMPLANT
CLIP APPLIE 9.375 MED OPEN (MISCELLANEOUS) IMPLANT
COVER BACK TABLE 60X90IN (DRAPES) ×3 IMPLANT
COVER MAYO STAND STRL (DRAPES) ×3 IMPLANT
DECANTER SPIKE VIAL GLASS SM (MISCELLANEOUS) IMPLANT
DERMABOND ADVANCED (GAUZE/BANDAGES/DRESSINGS) ×2
DERMABOND ADVANCED .7 DNX12 (GAUZE/BANDAGES/DRESSINGS) ×1 IMPLANT
DEVICE DUBIN W/COMP PLATE 8390 (MISCELLANEOUS) IMPLANT
DRAPE LAPAROSCOPIC ABDOMINAL (DRAPES) ×3 IMPLANT
DRAPE UTILITY XL STRL (DRAPES) ×3 IMPLANT
ELECT COATED BLADE 2.86 ST (ELECTRODE) ×3 IMPLANT
ELECT REM PT RETURN 9FT ADLT (ELECTROSURGICAL) ×3
ELECTRODE REM PT RTRN 9FT ADLT (ELECTROSURGICAL) ×1 IMPLANT
GLOVE BIO SURGEON STRL SZ7.5 (GLOVE) ×3 IMPLANT
GLOVE BIOGEL M STRL SZ7.5 (GLOVE) ×2 IMPLANT
GLOVE BIOGEL PI IND STRL 8 (GLOVE) IMPLANT
GLOVE BIOGEL PI INDICATOR 8 (GLOVE) ×2
GOWN STRL REUS W/ TWL LRG LVL3 (GOWN DISPOSABLE) ×2 IMPLANT
GOWN STRL REUS W/TWL LRG LVL3 (GOWN DISPOSABLE) ×6
ILLUMINATOR WAVEGUIDE N/F (MISCELLANEOUS) IMPLANT
KIT MARKER MARGIN INK (KITS) IMPLANT
LIGHT WAVEGUIDE WIDE FLAT (MISCELLANEOUS) IMPLANT
NDL HYPO 25X1 1.5 SAFETY (NEEDLE) ×1 IMPLANT
NEEDLE HYPO 25X1 1.5 SAFETY (NEEDLE) ×3 IMPLANT
NS IRRIG 1000ML POUR BTL (IV SOLUTION) ×3 IMPLANT
PACK BASIN DAY SURGERY FS (CUSTOM PROCEDURE TRAY) ×3 IMPLANT
PENCIL BUTTON HOLSTER BLD 10FT (ELECTRODE) ×3 IMPLANT
SLEEVE SCD COMPRESS KNEE MED (MISCELLANEOUS) ×3 IMPLANT
SPONGE LAP 18X18 RF (DISPOSABLE) ×3 IMPLANT
STAPLER VISISTAT 35W (STAPLE) IMPLANT
SUT MON AB 4-0 PC3 18 (SUTURE) ×3 IMPLANT
SUT SILK 2 0 SH (SUTURE) IMPLANT
SUT VICRYL 3-0 CR8 SH (SUTURE) ×3 IMPLANT
SYR CONTROL 10ML LL (SYRINGE) ×3 IMPLANT
TOWEL OR 17X24 6PK STRL BLUE (TOWEL DISPOSABLE) ×3 IMPLANT
TOWEL OR NON WOVEN STRL DISP B (DISPOSABLE) ×3 IMPLANT
TUBE CONNECTING 20'X1/4 (TUBING) ×1
TUBE CONNECTING 20X1/4 (TUBING) ×2 IMPLANT
YANKAUER SUCT BULB TIP NO VENT (SUCTIONS) ×3 IMPLANT

## 2017-08-05 NOTE — H&P (Signed)
Bridget Strong  Location: [facility] Patient #: [id number] DOB: 1950/07/06 Married / Language: English / Race: White Female   History of Present Illness  The patient is a 67 year old female who presents for a follow-up for Breast cancer. The patient is a 67 year old white female who is about 2 weeks status post left breast central lumpectomy for ductal carcinoma in situ. The cancer was ER and PR negative. She tolerated the surgery well. She complains only of some sensitivity at the operative site. Her margins were clean but her lateral margin was close.   Problem List/Past Medical  DUCTAL CARCINOMA IN SITU (DCIS) OF LEFT BREAST (D05.12)   Past Surgical History  Breast Biopsy  Left. Gallbladder Surgery - Laparoscopic  Hysterectomy (not due to cancer) - Partial   Diagnostic Studies History  Colonoscopy  5-10 years ago Mammogram  within last year Pap Smear  1-5 years ago  Allergies  No Known Drug Allergies  Allergies Reconciled   Medication History  Estradiol (0.5MG  Tablet, Oral) Active. Pravastatin Sodium (40MG  Tablet, Oral) Active. Lisinopril (20MG  Tablet, Oral) Active. Estradiol (0.1MG /GM Cream, Vaginal) Active. Vitamin C (500MG  Tablet, 1 (one) Oral) Active. Calcium Carbonate-Vit D-Min (600-400MG -UNIT Tablet, Oral) Active. Medications Reconciled  Social History Caffeine use  Carbonated beverages, Coffee, Tea. No alcohol use  No drug use  Tobacco use  Never smoker.  Family History  Alcohol Abuse  Father. Cancer  Brother. Cerebrovascular Accident  Father. Hypertension  Father.  Other Problems  Bladder Problems  Cholelithiasis  General anesthesia - complications  High blood pressure  Hypercholesterolemia  Oophorectomy  Bilateral. Pancreatitis     Review of Systems  General Not Present- Appetite Loss, Chills, Fatigue, Fever, Night Sweats, Weight Gain and Weight Loss. Skin Not Present- Change in Wart/Mole,  Dryness, Hives, Jaundice, New Lesions, Non-Healing Wounds, Rash and Ulcer. HEENT Not Present- Earache, Hearing Loss, Hoarseness, Nose Bleed, Oral Ulcers, Ringing in the Ears, Seasonal Allergies, Sinus Pain, Sore Throat, Visual Disturbances, Wears glasses/contact lenses and Yellow Eyes. Respiratory Not Present- Bloody sputum, Chronic Cough, Difficulty Breathing, Snoring and Wheezing. Breast Present- Breast Mass. Not Present- Breast Pain, Nipple Discharge and Skin Changes. Cardiovascular Not Present- Chest Pain, Difficulty Breathing Lying Down, Leg Cramps, Palpitations, Rapid Heart Rate, Shortness of Breath and Swelling of Extremities. Gastrointestinal Not Present- Abdominal Pain, Bloating, Bloody Stool, Change in Bowel Habits, Chronic diarrhea, Constipation, Difficulty Swallowing, Excessive gas, Gets full quickly at meals, Hemorrhoids, Indigestion, Nausea, Rectal Pain and Vomiting. Female Genitourinary Not Present- Frequency, Nocturia, Painful Urination, Pelvic Pain and Urgency. Musculoskeletal Not Present- Back Pain, Joint Pain, Joint Stiffness, Muscle Pain, Muscle Weakness and Swelling of Extremities. Neurological Not Present- Decreased Memory, Fainting, Headaches, Numbness, Seizures, Tingling, Tremor, Trouble walking and Weakness. Psychiatric Not Present- Anxiety, Bipolar, Change in Sleep Pattern, Depression, Fearful and Frequent crying. Endocrine Not Present- Cold Intolerance, Excessive Hunger, Hair Changes, Heat Intolerance, Hot flashes and New Diabetes. Hematology Present- Excessive bleeding. Not Present- Blood Thinners, Easy Bruising, Gland problems, HIV and Persistent Infections.  Vitals  Weight: 129.4 lb Height: 64in Body Surface Area: 1.63 m Body Mass Index: 22.21 kg/m  Temp.: 45F  Pulse: 90 (Regular)  BP: 132/72 (Sitting, Left Arm, Standard)       Physical Exam  General Mental Status-Alert. General Appearance-Consistent with stated age. Hydration-Well  hydrated. Voice-Normal.  Head and Neck Head-normocephalic, atraumatic with no lesions or palpable masses. Trachea-midline. Thyroid Gland Characteristics - normal size and consistency.  Eye Eyeball - Bilateral-Extraocular movements intact. Sclera/Conjunctiva - Bilateral-No scleral icterus.  Chest and Lung Exam Chest and lung exam reveals -quiet, even and easy respiratory effort with no use of accessory muscles and on auscultation, normal breath sounds, no adventitious sounds and normal vocal resonance. Inspection Chest Wall - Normal. Back - normal.  Breast Breast - Left-Symmetric, Tender and Lumpectomy scar. Breast - Right-Symmetric, Non Tender, No Biopsy scars, no Dimpling, No Inflammation, No Lumpectomy scars, No Mastectomy scars, No Peau d' Orange. Breast Lump-No Palpable Breast Mass. Note: The left breast central incision is healing nicely with no sign of infection or seroma. The breast shape is good.   Cardiovascular Cardiovascular examination reveals -normal heart sounds, regular rate and rhythm with no murmurs and normal pedal pulses bilaterally.  Abdomen Inspection Inspection of the abdomen reveals - No Hernias. Skin - Scar - no surgical scars. Palpation/Percussion Palpation and Percussion of the abdomen reveal - Soft, Non Tender, No Rebound tenderness, No Rigidity (guarding) and No hepatosplenomegaly. Auscultation Auscultation of the abdomen reveals - Bowel sounds normal.  Neurologic Neurologic evaluation reveals -alert and oriented x 3 with no impairment of recent or remote memory. Mental Status-Normal.  Musculoskeletal Normal Exam - Left-Upper Extremity Strength Normal and Lower Extremity Strength Normal. Normal Exam - Right-Upper Extremity Strength Normal and Lower Extremity Strength Normal.  Lymphatic Head & Neck  General Head & Neck Lymphatics: Bilateral - Description - Normal. Axillary  General Axillary Region: Bilateral -  Description - Normal. Tenderness - Non Tender. Femoral & Inguinal  Generalized Femoral & Inguinal Lymphatics: Bilateral - Description - Normal. Tenderness - Non Tender.    Assessment & Plan  DUCTAL CARCINOMA IN SITU (DCIS) OF LEFT BREAST (D05.12) Impression: The patient is 2 weeks status post left breast lumpectomy for ductal carcinoma in situ. Her margins were clean but her lateral margin was close. Because of this I think she should have her lateral margin reexcised. I have discussed this with her in detail including the risks and benefits of surgery as well as some of the technical aspects and she understands and wishes to proceed. We will then send her back to medical and radiation oncology for adjuvant therapy. Current Plans Follow up with [place] in the office in 1 month.  Call us sooner as needed.

## 2017-08-05 NOTE — Discharge Instructions (Signed)

## 2017-08-05 NOTE — Anesthesia Preprocedure Evaluation (Signed)
Anesthesia Evaluation  Patient identified by MRN, date of birth, ID band Patient awake    Reviewed: Allergy & Precautions, NPO status , Patient's Chart, lab work & pertinent test results  History of Anesthesia Complications (+) PONV  Airway Mallampati: I       Dental  (+) Teeth Intact   Pulmonary    Pulmonary exam normal breath sounds clear to auscultation       Cardiovascular hypertension, Pt. on medications Normal cardiovascular exam Rhythm:Regular Rate:Normal     Neuro/Psych negative neurological ROS  negative psych ROS   GI/Hepatic negative GI ROS, Neg liver ROS,   Endo/Other  negative endocrine ROS  Renal/GU negative Renal ROS  negative genitourinary   Musculoskeletal negative musculoskeletal ROS (+)   Abdominal Normal abdominal exam  (+)   Peds  Hematology negative hematology ROS (+)   Anesthesia Other Findings   Reproductive/Obstetrics                             Anesthesia Physical  Anesthesia Plan  ASA: II  Anesthesia Plan: General   Post-op Pain Management:    Induction: Intravenous  PONV Risk Score and Plan: 3 and Ondansetron and Dexamethasone  Airway Management Planned: LMA  Additional Equipment:   Intra-op Plan:   Post-operative Plan:   Informed Consent: I have reviewed the patients History and Physical, chart, labs and discussed the procedure including the risks, benefits and alternatives for the proposed anesthesia with the patient or authorized representative who has indicated his/her understanding and acceptance.     Plan Discussed with: CRNA and Surgeon  Anesthesia Plan Comments:         Anesthesia Quick Evaluation

## 2017-08-05 NOTE — Transfer of Care (Signed)
Immediate Anesthesia Transfer of Care Note  Patient: Bridget Strong  Procedure(s) Performed: RE-EXCISION OF BREAST CANCER LATERAL MARGINS (Left Breast)  Patient Location: PACU  Anesthesia Type:General  Level of Consciousness: sedated  Airway & Oxygen Therapy: Patient Spontanous Breathing and Patient connected to face mask oxygen  Post-op Assessment: Report given to RN and Post -op Vital signs reviewed and stable  Post vital signs: Reviewed and stable  Last Vitals:  Vitals Value Taken Time  BP 117/61 08/05/2017 12:42 PM  Temp    Pulse 83 08/05/2017 12:44 PM  Resp 19 08/05/2017 12:44 PM  SpO2 100 % 08/05/2017 12:44 PM  Vitals shown include unvalidated device data.  Last Pain:  Vitals:   08/05/17 1051  TempSrc: Oral         Complications: No apparent anesthesia complications

## 2017-08-05 NOTE — Interval H&P Note (Signed)
History and Physical Interval Note:  08/05/2017 11:49 AM  Bridget Gave  has presented today for surgery, with the diagnosis of LEFT BREAST DCIS  The various methods of treatment have been discussed with the patient and family. After consideration of risks, benefits and other options for treatment, the patient has consented to  Procedure(s): RE-EXCISION OF BREAST CANCER LATERAL MARGINS (Left) as a surgical intervention .  The patient's history has been reviewed, patient examined, no change in status, stable for surgery.  I have reviewed the patient's chart and labs.  Questions were answered to the patient's satisfaction.     TOTH III,PAUL S

## 2017-08-05 NOTE — Anesthesia Procedure Notes (Signed)
Procedure Name: LMA Insertion Date/Time: 08/05/2017 12:07 PM Performed by: Maryella Shivers, CRNA Pre-anesthesia Checklist: Patient identified, Emergency Drugs available, Suction available and Patient being monitored Patient Re-evaluated:Patient Re-evaluated prior to induction Oxygen Delivery Method: Circle system utilized Preoxygenation: Pre-oxygenation with 100% oxygen Induction Type: IV induction Ventilation: Mask ventilation without difficulty LMA: LMA inserted LMA Size: 4.0 Number of attempts: 1 Airway Equipment and Method: Bite block Placement Confirmation: positive ETCO2 Tube secured with: Tape Dental Injury: Teeth and Oropharynx as per pre-operative assessment

## 2017-08-05 NOTE — Op Note (Signed)
08/05/2017  12:36 PM  PATIENT:  Bridget Strong  67 y.o. female  PRE-OPERATIVE DIAGNOSIS:  LEFT BREAST DCIS  POST-OPERATIVE DIAGNOSIS:  LEFT BREAST DCIS  PROCEDURE:  Procedure(s): RE-EXCISION OF LEFT BREAST LATERAL MARGIN  SURGEON:  Surgeon(s) and Role:    * Jovita Kussmaul, MD - Primary  PHYSICIAN ASSISTANT:   ASSISTANTS: none   ANESTHESIA:   local and general  EBL:  minimal   BLOOD ADMINISTERED:none  DRAINS: none   LOCAL MEDICATIONS USED:  MARCAINE     SPECIMEN:  Source of Specimen:  left breast lateral margin  DISPOSITION OF SPECIMEN:  PATHOLOGY  COUNTS:  YES  TOURNIQUET:  * No tourniquets in log *  DICTATION: .Dragon Dictation   After informed consent was obtained the patient was brought to the operating room and placed in the supine position on the operating table.  After adequate induction of general anesthesia the patient's left breast was prepped with ChloraPrep, allowed to dry, and draped in usual sterile manner.  An appropriate timeout was performed.  The lateral portion of the breast was infiltrated with quarter percent Marcaine.  The lateral portion of the previous incision was opened sharply with a 15 blade knife.  The incision was carried through the skin and subcutaneous tissue sharply with the electrocautery.  The previous closure stitches were identified.  The lateral margin was excised sharply with the electrocautery.  The tissue looked like normal-appearing fat.  Once the specimen was removed it was oriented with the appropriate paint colors.  The specimen was then sent to pathology for further evaluation.  The wound was infiltrated with more quarter percent Marcaine and irrigated with saline.  Hemostasis was achieved using the Bovie electrocautery.  The deep layer of the wound was then closed with layers of interrupted 3-0 Vicryl stitches.  The skin was then closed with a running 4-0 Monocryl subcuticular stitch.  Dermabond dressings were applied.  The  patient tolerated the procedure well.  At the end of the case all needle sponge and instrument counts were correct.  The patient was then awakened and taken to recovery in stable condition.  PLAN OF CARE: Discharge to home after PACU  PATIENT DISPOSITION:  PACU - hemodynamically stable.   Delay start of Pharmacological VTE agent (>24hrs) due to surgical blood loss or risk of bleeding: not applicable

## 2017-08-05 NOTE — Anesthesia Postprocedure Evaluation (Signed)
Anesthesia Post Note  Patient: Bridget Strong  Procedure(s) Performed: RE-EXCISION OF BREAST CANCER LATERAL MARGINS (Left Breast)     Patient location during evaluation: PACU Anesthesia Type: General Level of consciousness: awake and alert Pain management: pain level controlled Vital Signs Assessment: post-procedure vital signs reviewed and stable Respiratory status: spontaneous breathing, nonlabored ventilation, respiratory function stable and patient connected to nasal cannula oxygen Cardiovascular status: blood pressure returned to baseline and stable Postop Assessment: no apparent nausea or vomiting Anesthetic complications: no    Last Vitals:  Vitals:   08/05/17 1315 08/05/17 1330  BP: 129/68 116/61  Pulse: 66 63  Resp: 19 19  Temp:    SpO2: 100% 100%    Last Pain:  Vitals:   08/05/17 1330  TempSrc:   PainSc: 4                  Montez Hageman

## 2017-08-06 ENCOUNTER — Encounter (HOSPITAL_BASED_OUTPATIENT_CLINIC_OR_DEPARTMENT_OTHER): Payer: Self-pay | Admitting: General Surgery

## 2017-08-19 ENCOUNTER — Encounter (HOSPITAL_BASED_OUTPATIENT_CLINIC_OR_DEPARTMENT_OTHER): Payer: Self-pay | Admitting: General Surgery

## 2017-08-20 ENCOUNTER — Ambulatory Visit: Payer: Medicare Other | Admitting: Obstetrics and Gynecology

## 2017-08-20 ENCOUNTER — Encounter: Payer: Self-pay | Admitting: Obstetrics and Gynecology

## 2017-08-20 ENCOUNTER — Other Ambulatory Visit: Payer: Self-pay

## 2017-08-20 VITALS — BP 118/62 | HR 72 | Temp 98.3°F | Resp 14 | Ht 64.0 in | Wt 131.0 lb

## 2017-08-20 DIAGNOSIS — N3001 Acute cystitis with hematuria: Secondary | ICD-10-CM

## 2017-08-20 DIAGNOSIS — R319 Hematuria, unspecified: Secondary | ICD-10-CM

## 2017-08-20 LAB — POCT URINALYSIS DIPSTICK
Bilirubin, UA: NEGATIVE
Glucose, UA: NEGATIVE
Ketones, UA: NEGATIVE
NITRITE UA: NEGATIVE
PH UA: 8 (ref 5.0–8.0)
PROTEIN UA: 2
UROBILINOGEN UA: 0.2 U/dL

## 2017-08-20 MED ORDER — CIPROFLOXACIN HCL 500 MG PO TABS: 500.0000 mg | ORAL_TABLET | Freq: Two times a day (BID) | ORAL | 0 refills | Status: DC

## 2017-08-20 NOTE — Progress Notes (Signed)
GYNECOLOGY  VISIT   HPI: 67 y.o.   Married  Caucasian  female   [phone] with Patient's last menstrual period was 05/14/1996.   here for UTI/ blood in urine since this morning   Urine: Extremely large amount of blood, 4+ WBC, 2+ Protein, 8.0pH.  Could not empty her bladder this am and noted blood in the toilet.  Voided here and feeling relief.  No painful urination, shakes or chills, fever, nausea, or back pain.   No prior UTI.   No personal hx of renal stones.  FH of renal stones.   Just had re-excision of breast cancer 2 weeks ago.  Will start XRT now.  GYNECOLOGIC HISTORY: Patient's last menstrual period was 05/14/1996. Contraception:  Hysterectomy  Menopausal hormone therapy:  none Last mammogram:  06/03/17 BIRADS 5: Highly suggestive of malignancy -- see Epic for details -- patient has had breast biopsy and lumpectomy Last pap smear:   04/23/12 Negative        OB History    Gravida  4   Para  3   Term  3   Preterm      AB  1   Living  3     SAB  1   TAB      Ectopic      Multiple      Live Births                 Patient Active Problem List   Diagnosis Date Noted  . Ductal carcinoma in situ (DCIS) of left breast 06/19/2017  . Status post surgery 11/23/2014  . Vaginal vault prolapse after hysterectomy 11/09/2013  . BACTERIAL PNEUMONIA 06/13/2010  . FEVER UNSPECIFIED 06/09/2010  . HYPERLIPIDEMIA 02/09/2009  . HYPERTENSION 02/09/2009    Past Medical History:  Diagnosis Date  . Broken humerus 10-24-13  . Cancer [facility])    breast cancer  . Chicken pox   . Complication of anesthesia 1998   Developed bigeminy post op hysterectomy  . Hyperlipidemia   . Hypertension     Past Surgical History:  Procedure Laterality Date  . ABDOMINAL HYSTERECTOMY  1998   TVH--ovaries remain  . ABDOMINAL SACROCOLPOPEXY N/A 11/23/2014   Procedure: ABDOMINAL SACROCOLPOPEXY/HALBAN'S CULDOPLASTY;  Surgeon: Nunzio Cobbs, MD;  Location: [facility];  Service:  Gynecology;  Laterality: N/A;  . ANTERIOR AND POSTERIOR REPAIR N/A 11/23/2014   Procedure: ANTERIOR (CYSTOCELE) ;  Surgeon: Nunzio Cobbs, MD;  Location: [facility];  Service: Gynecology;  Laterality: N/A;  . BLADDER SUSPENSION N/A 11/23/2014   Procedure: EXACT MIDURETERAL SLING;  Surgeon: Nunzio Cobbs, MD;  Location: [facility];  Service: Gynecology;  Laterality: N/A;  . BREAST LUMPECTOMY WITH RADIOACTIVE SEED AND SENTINEL LYMPH NODE BIOPSY Left 07/04/2017   Procedure: RADIOACTIVE SEED GUIDED LEFT BREAST LUMPECTOMY, LEFT AXILLARY SENTINEL LYMPH NODE BIOPSY;  Surgeon: Jovita Kussmaul, MD;  Location: [place];  Service: General;  Laterality: Left;  . CHOLECYSTECTOMY  2004  . CYSTOSCOPY N/A 11/23/2014   Procedure: CYSTOSCOPY;  Surgeon: Nunzio Cobbs, MD;  Location: [facility];  Service: Gynecology;  Laterality: N/A;  . LYSIS OF ADHESION N/A 11/23/2014   Procedure: LYSIS OF ADHESION;  Surgeon: Nunzio Cobbs, MD;  Location: [facility];  Service: Gynecology;  Laterality: N/A;  . RE-EXCISION OF BREAST LUMPECTOMY Left 08/05/2017   Procedure: RE-EXCISION OF BREAST CANCER LATERAL MARGINS;  Surgeon: Jovita Kussmaul, MD;  Location: [place];  Service: General;  Laterality: Left;  . SALPINGOOPHORECTOMY Bilateral 11/23/2014   Procedure: BILATERAL SALPINGO OOPHORECTOMY;  Surgeon: Nunzio Cobbs, MD;  Location: [facility];  Service: Gynecology;  Laterality: Bilateral;  . TUBAL LIGATION      Current Outpatient Medications  Medication Sig Dispense Refill  . Calcium Carbonate-Vitamin D (CALCIUM-VITAMIN D) 600-200 MG-UNIT CAPS Take 1 tablet by mouth 2 (two) times daily.     Marland Kitchen lisinopril (PRINIVIL,ZESTRIL) 20 MG tablet Take 1 tablet (20 mg total) by mouth daily. (Patient taking differently: Take 20 mg by mouth every evening. ) 90 tablet 3  . pravastatin (PRAVACHOL) 40 MG tablet TAKE 1 TABLET BY MOUTH DAILY (Patient taking differently: TAKE 1 TABLET BY MOUTH IN THE EVENING)  90 tablet 3  . vitamin C (ASCORBIC ACID) 500 MG tablet Take 1,000 mg by mouth daily.     . ciprofloxacin (CIPRO) 500 MG tablet Take 1 tablet (500 mg total) by mouth 2 (two) times daily. 14 tablet 0   No current facility-administered medications for this visit.      ALLERGIES: Diflucan [fluconazole]; Hydrocodone; Tramadol; and Oxycodone  Family History  Problem Relation Age of Onset  . Stroke Father   . Alcohol abuse Father   . Cancer Brother        bladder cancer  . Alcohol abuse Other   . Arthritis Other   . Hypertension Other   . Stroke Other   . Breast cancer Maternal Grandmother 50  . Breast cancer Paternal Grandmother 22    Social History   Socioeconomic History  . Marital status: Married    Spouse name: Not on file  . Number of children: Not on file  . Years of education: Not on file  . Highest education level: Not on file  Occupational History  . Not on file  Social Needs  . Financial resource strain: Not on file  . Food insecurity:    Worry: Not on file    Inability: Not on file  . Transportation needs:    Medical: Not on file    Non-medical: Not on file  Tobacco Use  . Smoking status: Never Smoker  . Smokeless tobacco: Never Used  Substance and Sexual Activity  . Alcohol use: No    Alcohol/week: 0.0 oz  . Drug use: No  . Sexual activity: Yes    Partners: Male    Birth control/protection: Surgical    Comment: TVH-ovaries remain  Lifestyle  . Physical activity:    Days per week: Not on file    Minutes per session: Not on file  . Stress: Not on file  Relationships  . Social connections:    Talks on phone: Not on file    Gets together: Not on file    Attends religious service: Not on file    Active member of club or organization: Not on file    Attends meetings of clubs or organizations: Not on file    Relationship status: Not on file  . Intimate partner violence:    Fear of current or ex partner: Not on file    Emotionally abused: Not on file     Physically abused: Not on file    Forced sexual activity: Not on file  Other Topics Concern  . Not on file  Social History Narrative  . Not on file    ROS:  Pertinent items are noted in HPI.  PHYSICAL EXAMINATION:    BP 118/62 (BP Location: Right Arm, Patient Position: Sitting,  Cuff Size: Normal)   Pulse 72   Temp 98.3 F (36.8 C) (Oral)   Resp 14   Ht 5\' 4"  (1.626 m)   Wt 131 lb (59.4 kg)   LMP 05/14/1996   BMI 22.49 kg/m     General appearance: alert, cooperative and appears stated age  Pelvic: External genitalia:  no lesions              Urethra:  normal appearing urethra with no masses, tenderness or lesions              Bartholins and Skenes: normal                 Vagina: normal appearing vagina with normal color and discharge, no lesions.  First to second degree bladder prolapse.  Vaginal vault and posterior vaginal wall supported well.              Cervix: absent                 Bimanual Exam:  Uterus:  Absent.              Adnexa: no mass, fullness, tenderness            Procedure - Sterile urine catheterization Verbal consent.  Betadine prep.   50 cc obtained and sent for micro and culture. Frank blood and small clot noted.  No complications.   Chaperone was present for exam.  ASSESSMENT  Hemorrhagic UTI.  FH of renal stones.  PLAN  Urine micro and culture.  Ciprofloxacin 500 mg po bid x 7 days.  Discussed risk of tendinitis.  FU in 10 days.  If negative UC, will send to urology for gross hematuria.  Return if worse or not progressing in 48 hours.   An After Visit Summary was printed and given to the patient.  ___15___ minutes face to face time of which over 50% was spent in counseling.

## 2017-08-20 NOTE — Progress Notes (Signed)
Location of Breast Cancer:Ductal carcinoma in situ (DCIS) of left breast    Histology per Pathology Report:  Diagnosis  06-20-17 Breast, left, needle core biopsy, outer anterior - BENIGN BREAST TISSUE WITH FOCAL CHRONIC INFLAMMATION AND FEATURES SUGGESTIVE OF GRANULOMA - VESSELS WITH CALCIFICATIONS ecimen Gross and Clinical Information Diagnosis 06-17-17 Breast, left, needle core biopsy, central lower - HIGH GRADE DUCTAL CARCINOMA IN SITU WITH NECROSIS - SEE COMMENT  Receptor Status: ER(0% -), PR (0% -)  Did patient present with symptoms (if so, please note symptoms) or was this found on screening mammography?screening detercted calcifications of the left breast   Past/Anticipated interventions by surgeon, if any: Diagnosis 08-05-17 Dr. Autumn Messing Breast, excision, Left of lateral margin - BENIGN BREAST TISSUE WITH EXCISIONAL SITE CHANGES. - NO CARCINOMA IDENTIFIED.  Diagnosis 07-04-17 Dr.  Autumn Messing 1. Breast, lumpectomy, Left w/seed - HIGH-GRADE DUCTAL CARCINOMA IN SITU (DCIS) WITH COMEDO NECROSIS, 0.3 CM - NEGATIVE FOR INVASIVE CARCINOMA - SEE ONCOLOGY TABLE 2. Lymph node, sentinel, biopsy, Left Axillary #1 - LYMPH NODE, NEGATIVE FOR CARCINOMA 3. Lymph node, sentinel, biopsy, Left Axillary #2 - LYMPH NODE, NEGATIVE FOR CARCINOMA 4. Breast, excision, Left lateral margin - HIGH-GRADE DUCTAL CARCINOMA IN SITU (DCIS) WITH FOCAL NECROSIS - NEW INKED MARGIN IS BROADLY LESS THAN 0.1 CM FROM DCIS - NEGATIVE FOR INVASIVE CARCINOMA - SEE ONCOLOGY TABLE   Past/Anticipated interventions by medical oncology, if any: Chemotherapy No  Lymphedema issues, if any:  No ROM to left arm  good Skin to left breast healing well bruising fading "states skin is sensitive to touch.  Saw Dr. Marlou Starks for follow up Monday 08-19-17 and will see him in 6 months.  Pain issues, if any: No  SAFETY ISSUES:  Prior radiation? :No  Pacemaker/ICD? :No  Possible current pregnancy? :No  Is the patient  on methotrexate? : No  Menarche 11 G4 P3  BC Menopause 46  LMP 1998 Hysterectomy  HRT Yes pill and cream, until 2 weeks ago.   Current Complaints / other details:  Breast cancer Maternal grandmother and paternal grandmother, brother bladder cancer Wt Readings from Last 3 Encounters:  08/27/17 131 lb 6.4 oz (59.6 kg)  08/20/17 131 lb (59.4 kg)  08/05/17 128 lb 4 oz (58.2 kg)  BP (!) 149/55 (BP Location: Right Arm, Patient Position: Sitting, Cuff Size: Normal)   Pulse 63   Temp 97.8 F (36.6 C) (Oral)   Resp 20   Ht 5\' 4"  (1.626 m)   Wt 131 lb 6.4 oz (59.6 kg)   LMP 05/14/1996   SpO2 100%   BMI 22.55 kg/m    Georgena Spurling, RN 08/20/2017,9:45 AM

## 2017-08-20 NOTE — Patient Instructions (Signed)
Hematuria, Adult Hematuria is blood in your urine. It can be caused by a bladder infection, kidney infection, prostate infection, kidney stone, or cancer of your urinary tract. Infections can usually be treated with medicine, and a kidney stone usually will pass through your urine. If neither of these is the cause of your hematuria, further workup to find out the reason may be needed. It is very important that you tell your health care provider about any blood you see in your urine, even if the blood stops without treatment or happens without causing pain. Blood in your urine that happens and then stops and then happens again can be a symptom of a very serious condition. Also, pain is not a symptom in the initial stages of many urinary cancers. Follow these instructions at home:  Drink lots of fluid, 3-4 quarts a day. If you have been diagnosed with an infection, cranberry juice is especially recommended, in addition to large amounts of water.  Avoid caffeine, tea, and carbonated beverages because they tend to irritate the bladder.  Avoid alcohol because it may irritate the prostate.  Take all medicines as directed by your health care provider.  If you were prescribed an antibiotic medicine, finish it all even if you start to feel better.  If you have been diagnosed with a kidney stone, follow your health care provider's instructions regarding straining your urine to catch the stone.  Empty your bladder often. Avoid holding urine for long periods of time.  After a bowel movement, women should cleanse front to back. Use each tissue only once.  Empty your bladder before and after sexual intercourse if you are a female. Contact a health care provider if:  You develop back pain.  You have a fever.  You have a feeling of sickness in your stomach (nausea) or vomiting.  Your symptoms are not better in 3 days. Return sooner if you are getting worse. Get help right away if:  You develop  severe vomiting and are unable to keep the medicine down.  You develop severe back or abdominal pain despite taking your medicines.  You begin passing a large amount of blood or clots in your urine.  You feel extremely weak or faint, or you pass out. This information is not intended to replace advice given to you by your health care provider. Make sure you discuss any questions you have with your health care provider. Document Released: 04/30/2005 Document Revised: 10/06/2015 Document Reviewed: 12/29/2012 Elsevier Interactive Patient Education  2017 Elsevier Inc. Urinary Tract Infection, Adult A urinary tract infection (UTI) is an infection of any part of the urinary tract. The urinary tract includes the:  Kidneys.  Ureters.  Bladder.  Urethra.  These organs make, store, and get rid of pee (urine) in the body. Follow these instructions at home:  Take over-the-counter and prescription medicines only as told by your doctor.  If you were prescribed an antibiotic medicine, take it as told by your doctor. Do not stop taking the antibiotic even if you start to feel better.  Avoid the following drinks: ? Alcohol. ? Caffeine. ? Tea. ? Carbonated drinks.  Drink enough fluid to keep your pee clear or pale yellow.  Keep all follow-up visits as told by your doctor. This is important.  Make sure to: ? Empty your bladder often and completely. Do not to hold pee for long periods of time. ? Empty your bladder before and after sex. ? Wipe from front to back after a bowel  movement if you are female. Use each tissue one time when you wipe. Contact a doctor if:  You have back pain.  You have a fever.  You feel sick to your stomach (nauseous).  You throw up (vomit).  Your symptoms do not get better after 3 days.  Your symptoms go away and then come back. Get help right away if:  You have very bad back pain.  You have very bad lower belly (abdominal) pain.  You are throwing up  and cannot keep down any medicines or water. This information is not intended to replace advice given to you by your health care provider. Make sure you discuss any questions you have with your health care provider. Document Released: 10/17/2007 Document Revised: 10/06/2015 Document Reviewed: 03/21/2015 Elsevier Interactive Patient Education  [employer].

## 2017-08-21 LAB — URINALYSIS, MICROSCOPIC ONLY
CASTS: NONE SEEN /LPF
EPITHELIAL CELLS (NON RENAL): NONE SEEN /HPF (ref 0–10)

## 2017-08-22 LAB — URINE CULTURE

## 2017-08-27 ENCOUNTER — Encounter: Payer: Self-pay | Admitting: Radiation Oncology

## 2017-08-27 ENCOUNTER — Ambulatory Visit
Admission: RE | Admit: 2017-08-27 | Discharge: 2017-08-27 | Disposition: A | Discharge status: Home / Self Care | Payer: Medicare Other | Source: Ambulatory Visit | Attending: Radiation Oncology | Admitting: Radiation Oncology

## 2017-08-27 ENCOUNTER — Other Ambulatory Visit: Payer: Self-pay

## 2017-08-27 VITALS — BP 149/55 | HR 63 | Temp 97.8°F | Resp 20 | Ht 64.0 in | Wt 131.4 lb

## 2017-08-27 DIAGNOSIS — Z171 Estrogen receptor negative status [ER-]: Secondary | ICD-10-CM | POA: Diagnosis not present

## 2017-08-27 DIAGNOSIS — Z79899 Other long term (current) drug therapy: Secondary | ICD-10-CM | POA: Diagnosis not present

## 2017-08-27 DIAGNOSIS — Z803 Family history of malignant neoplasm of breast: Secondary | ICD-10-CM | POA: Insufficient documentation

## 2017-08-27 DIAGNOSIS — Z8052 Family history of malignant neoplasm of bladder: Secondary | ICD-10-CM | POA: Diagnosis not present

## 2017-08-27 DIAGNOSIS — D0512 Intraductal carcinoma in situ of left breast: Secondary | ICD-10-CM | POA: Diagnosis not present

## 2017-08-27 DIAGNOSIS — I10 Essential (primary) hypertension: Secondary | ICD-10-CM | POA: Diagnosis not present

## 2017-08-27 DIAGNOSIS — E785 Hyperlipidemia, unspecified: Secondary | ICD-10-CM | POA: Diagnosis not present

## 2017-08-27 DIAGNOSIS — Z170 Estrogen receptor positive status [ER+]: Secondary | ICD-10-CM | POA: Diagnosis not present

## 2017-08-27 DIAGNOSIS — Z9889 Other specified postprocedural states: Secondary | ICD-10-CM | POA: Diagnosis not present

## 2017-08-27 NOTE — Progress Notes (Signed)
Radiation Oncology         [phone] ________________________________  Name: Bridget Strong        MRN: [id number]  Date of Service: 08/27/2017 DOB: 20-Mar-1951  XK:[id number], Bridget Sierras, MD  Truitt Merle, MD     REFERRING PHYSICIAN: Truitt Merle, MD   DIAGNOSIS: The encounter diagnosis was Ductal carcinoma in situ (DCIS) of left breast.   HISTORY OF PRESENT ILLNESS: Bridget Strong is a 67 y.o. female originally seen in the multidisciplinary breast clinic for a new diagnosis of left breast cancer. The patient was noted to have screening detercted calcifications of the left breast. Diagnostic imaging revealed a 3 cm span of calcifications between 5-6:00.there was no sonographic correlate. An MRI was performed and revealed  3.3 x 2x1.5 cm linear area of enhancement with extension to the nipple. No adenopathy was noted. A biopsy of the anterior and posterior aspects of the enhancement were each obtained. The anterior aspect of this was negative for disease, and the posterior site was consistent with a high grade DCIS with calcifications and necrosis, ER/PR negative. She underwent left lumpectomy on 07/04/2017 revealing a high-grade DCIS with necrosis measuring 3 mm negative for invasive disease. Two lymph nodes were also sampled and were negative for disease, she had a left lateral margin that revealed disease probably less than 1 mm from DCIS, she subsequently underwent reresection on 08/05/2017 and the remainder of the margin excision was negative for disease.  She comes today for further discussion of adjuvant radiotherapy.    PREVIOUS RADIATION THERAPY: No   PAST MEDICAL HISTORY:  Past Medical History:  Diagnosis Date  . Broken humerus 10-24-13  . Cancer [facility])    breast cancer  . Chicken pox   . Complication of anesthesia 1998   Developed bigeminy post op hysterectomy  . Hyperlipidemia   . Hypertension        PAST SURGICAL HISTORY: Past Surgical History:  Procedure Laterality Date  .  ABDOMINAL HYSTERECTOMY  1998   TVH--ovaries remain  . ABDOMINAL SACROCOLPOPEXY N/A 11/23/2014   Procedure: ABDOMINAL SACROCOLPOPEXY/HALBAN'S CULDOPLASTY;  Surgeon: Nunzio Cobbs, MD;  Location: [place] [facility];  Service: Gynecology;  Laterality: N/A;  . ANTERIOR AND POSTERIOR REPAIR N/A 11/23/2014   Procedure: ANTERIOR (CYSTOCELE) ;  Surgeon: Nunzio Cobbs, MD;  Location: [place] [facility];  Service: Gynecology;  Laterality: N/A;  . BLADDER SUSPENSION N/A 11/23/2014   Procedure: EXACT MIDURETERAL SLING;  Surgeon: Nunzio Cobbs, MD;  Location: [place] [facility];  Service: Gynecology;  Laterality: N/A;  . BREAST LUMPECTOMY WITH RADIOACTIVE SEED AND SENTINEL LYMPH NODE BIOPSY Left 07/04/2017   Procedure: RADIOACTIVE SEED GUIDED LEFT BREAST LUMPECTOMY, LEFT AXILLARY SENTINEL LYMPH NODE BIOPSY;  Surgeon: Jovita Kussmaul, MD;  Location: [place];  Service: General;  Laterality: Left;  . CHOLECYSTECTOMY  2004  . CYSTOSCOPY N/A 11/23/2014   Procedure: CYSTOSCOPY;  Surgeon: Nunzio Cobbs, MD;  Location: [place] [facility];  Service: Gynecology;  Laterality: N/A;  . LYSIS OF ADHESION N/A 11/23/2014   Procedure: LYSIS OF ADHESION;  Surgeon: Nunzio Cobbs, MD;  Location: [place] [facility];  Service: Gynecology;  Laterality: N/A;  . RE-EXCISION OF BREAST LUMPECTOMY Left 08/05/2017   Procedure: RE-EXCISION OF BREAST CANCER LATERAL MARGINS;  Surgeon: Jovita Kussmaul, MD;  Location: [place];  Service: General;  Laterality: Left;  . SALPINGOOPHORECTOMY Bilateral 11/23/2014   Procedure: BILATERAL SALPINGO OOPHORECTOMY;  Surgeon: Nunzio Cobbs,  MD;  Location: [place] [facility];  Service: Gynecology;  Laterality: Bilateral;  . TUBAL LIGATION       FAMILY HISTORY:  Family History  Problem Relation Age of Onset  . Stroke Father   . Alcohol abuse Father   . Cancer Brother        bladder cancer  . Alcohol abuse Other   . Arthritis Other   . Hypertension Other   . Stroke Other   . Breast cancer  Maternal Grandmother 44  . Breast cancer Paternal Grandmother 67     SOCIAL HISTORY:  reports that she has never smoked. She has never used smokeless tobacco. She reports that she does not drink alcohol or use drugs. The patient is married and lives in [place]. She's originally from [place], [place]. 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   ALLERGIES: Diflucan [fluconazole]; Hydrocodone; Tramadol; and Oxycodone   MEDICATIONS:  Current Outpatient Medications  Medication Sig Dispense Refill  . Calcium Carbonate-Vitamin D (CALCIUM-VITAMIN D) 600-200 MG-UNIT CAPS Take 1 tablet by mouth 2 (two) times daily.     . ciprofloxacin (CIPRO) 500 MG tablet Take 1 tablet (500 mg total) by mouth 2 (two) times daily. 14 tablet 0  . lisinopril (PRINIVIL,ZESTRIL) 20 MG tablet Take 1 tablet (20 mg total) by mouth daily. (Patient taking differently: Take 20 mg by mouth every evening. ) 90 tablet 3  . pravastatin (PRAVACHOL) 40 MG tablet TAKE 1 TABLET BY MOUTH DAILY (Patient taking differently: TAKE 1 TABLET BY MOUTH IN THE EVENING) 90 tablet 3  . vitamin C (ASCORBIC ACID) 500 MG tablet Take 1,000 mg by mouth daily.      No current facility-administered medications for this encounter.      REVIEW OF SYSTEMS: On review of systems, the patient reports that she is doing well overall.  She feels slightly tired since her surgery, and reports that she did have some discomfort when she underwent her seed localization prior to the original surgery.  This has improved.  She denies any chest pain, shortness of breath, cough, fevers, chills, night sweats, unintended weight changes. She denies any bowel or bladder disturbances, and denies abdominal pain, nausea or vomiting. She denies any new musculoskeletal or joint aches or pains. A complete review of systems is obtained and is otherwise negative.     PHYSICAL EXAM:  Wt Readings from Last 3 Encounters:  08/27/17 131 lb 6.4 oz (59.6  kg)  08/20/17 131 lb (59.4 kg)  08/05/17 128 lb 4 oz (58.2 kg)   Temp Readings from Last 3 Encounters:  08/27/17 97.8 F (36.6 C) (Oral)  08/20/17 98.3 F (36.8 C) (Oral)  08/05/17 97.9 F (36.6 C)   BP Readings from Last 3 Encounters:  08/27/17 (!) 149/55  08/20/17 118/62  08/05/17 125/65   Pulse Readings from Last 3 Encounters:  08/27/17 63  08/20/17 72  08/05/17 63     In general this is a well appearing caucasian female in no acute distress. She is alert and oriented x4 and appropriate throughout the examination. HEENT reveals that the patient is normocephalic, atraumatic. EOMs are intact. PERRLA. Skin is intact without any evidence of gross lesions.Cardiopulmonary assessment is negative for acute distress and she exhibits normal effort. The left breast has a well-healed lumpectomy site.  Of note her nipple areolar complex was resected at the time of her surgery.  No evidence of erythema or fullness is noted.   ECOG = 0  0 - Asymptomatic (  Fully active, able to carry on all predisease activities without restriction)  1 - Symptomatic but completely ambulatory (Restricted in physically strenuous activity but ambulatory and able to carry out work of a light or sedentary nature. For example, light housework, office work)  2 - Symptomatic, <50% in bed during the day (Ambulatory and capable of all self care but unable to carry out any work activities. Up and about more than 50% of waking hours)  3 - Symptomatic, >50% in bed, but not bedbound (Capable of only limited self-care, confined to bed or chair 50% or more of waking hours)  4 - Bedbound (Completely disabled. Cannot carry on any self-care. Totally confined to bed or chair)  5 - Death   Eustace Pen MM, Creech RH, Tormey DC, et al. [phone]). "Toxicity and response criteria of the [facility]". [place] Oncol. 5 (6): 649-55    LABORATORY DATA:  Lab Results  Component Value Date   WBC 4.8 06/26/2017    HGB 12.9 02/18/2017   HCT 36.8 06/26/2017   MCV 93.4 06/26/2017   PLT 246 06/26/2017   Lab Results  Component Value Date   NA 142 06/26/2017   K 4.4 06/26/2017   CL 108 06/26/2017   CO2 26 06/26/2017   Lab Results  Component Value Date   ALT 14 06/26/2017   AST 11 06/26/2017   ALKPHOS 87 06/26/2017   BILITOT 0.5 06/26/2017      RADIOGRAPHY: No results found.     IMPRESSION/PLAN: 1. High grade ER/PR negative DCIS of the left breast. Dr. Lisbeth Renshaw reviews her pathology findings and  the nature of non invasive breast disease.  She is done well since her surgery, and is appropriate to move forward with adjuvant therapy.  We again discussed the role of this.  We discussed the risks, benefits, short, and long term effects of radiotherapy, and the patient is interested in proceeding. Dr. Lisbeth Renshaw discusses the delivery and logistics of radiotherapy and recommendsa a course of 4 weeks with deep inspiration breath hold technique. Written consent is obtained and placed in the chart, a copy was provided to the patient. She will return later this week on Thursday for simulation.   In a visit lasting 25 minutes, greater than 50% of the time was spent face to face discussing her case, and coordinating the patient's care.   The above documentation reflects my direct findings during this shared patient visit. Please see the separate note by Dr. Lisbeth Renshaw on this date for the remainder of the patient's plan of care.    Carola Rhine, PAC

## 2017-08-28 ENCOUNTER — Ambulatory Visit
Admission: RE | Admit: 2017-08-28 | Discharge: 2017-08-28 | Disposition: A | Discharge status: Home / Self Care | Payer: Medicare Other | Source: Ambulatory Visit | Attending: Radiation Oncology | Admitting: Radiation Oncology

## 2017-08-28 DIAGNOSIS — D0512 Intraductal carcinoma in situ of left breast: Secondary | ICD-10-CM | POA: Insufficient documentation

## 2017-08-28 DIAGNOSIS — Z510 Encounter for antineoplastic radiation therapy: Secondary | ICD-10-CM | POA: Diagnosis not present

## 2017-08-28 DIAGNOSIS — Z170 Estrogen receptor positive status [ER+]: Secondary | ICD-10-CM | POA: Insufficient documentation

## 2017-08-28 MED ORDER — ALRA NON-METALLIC DEODORANT (RAD-ONC)
1.0000 "application " | Freq: Once | TOPICAL | Status: AC
  Administered 2017-08-28: 1 via TOPICAL

## 2017-08-28 MED ORDER — RADIAPLEXRX EX GEL
Freq: Once | CUTANEOUS | Status: AC
  Administered 2017-08-28: 16:00:00 via TOPICAL

## 2017-08-28 NOTE — Progress Notes (Signed)
Pt here for patient teaching.  Pt given Radiation and You booklet, Alra deodorant and Radiaplex gel.  Reviewed areas of pertinence such as fatigue, hair loss and skin changes . Pt able to give teach back of to pat skin and use unscented/gentle soap,apply Radiaplex bid, avoid applying anything to skin within 4 hours of treatment and to use an electric razor if they must shave. Pt verbalizes understanding of information given and will contact nursing with any questions or concerns.     Cori Razor, RN

## 2017-08-29 ENCOUNTER — Other Ambulatory Visit: Payer: Self-pay

## 2017-08-29 ENCOUNTER — Ambulatory Visit: Payer: Medicare Other | Admitting: Obstetrics and Gynecology

## 2017-08-29 ENCOUNTER — Encounter: Payer: Self-pay | Admitting: Obstetrics and Gynecology

## 2017-08-29 VITALS — BP 120/70 | HR 76 | Temp 97.6°F | Resp 16 | Ht 64.0 in | Wt 130.0 lb

## 2017-08-29 DIAGNOSIS — R319 Hematuria, unspecified: Secondary | ICD-10-CM | POA: Diagnosis not present

## 2017-08-29 DIAGNOSIS — R8290 Unspecified abnormal findings in urine: Secondary | ICD-10-CM

## 2017-08-29 DIAGNOSIS — N390 Urinary tract infection, site not specified: Secondary | ICD-10-CM | POA: Diagnosis not present

## 2017-08-29 LAB — POCT URINALYSIS DIPSTICK
Bilirubin, UA: NEGATIVE
Glucose, UA: NEGATIVE
KETONES UA: NEGATIVE
Leukocytes, UA: NEGATIVE
NITRITE UA: NEGATIVE
PROTEIN UA: NEGATIVE
RBC UA: NEGATIVE
UROBILINOGEN UA: 0.2 U/dL
pH, UA: 7 (ref 5.0–8.0)

## 2017-08-29 NOTE — Progress Notes (Signed)
GYNECOLOGY  VISIT   HPI: 67 y.o.   Married  Caucasian  female   [phone] with Patient's last menstrual period was 05/14/1996.   here for 10 day recheck; patient states that she is feeling much better and has completed the course of antibiotics.   Had discomfort for about 4 hours.  Now feeling better again.   No urinary burning or urgency.  No vaginal burning or discharge.   Recent hemorrhagic cystitis. Tx with Ciprofloxacin 500 mg po bid x 1 week. UC positive for E Coli on 08/20/17, sensitive to Cipro.  Urine - negative.   GYNECOLOGIC HISTORY: Patient's last menstrual period was 05/14/1996. Contraception:  Hysterectomy Menopausal hormone therapy:  none Last mammogram: see Epic for details, patient has had breast biopsies Last pap smear:   06/14/17 Negative        OB History    Gravida  4   Para  3   Term  3   Preterm      AB  1   Living  3     SAB  1   TAB      Ectopic      Multiple      Live Births                 Patient Active Problem List   Diagnosis Date Noted  . Ductal carcinoma in situ (DCIS) of left breast 06/19/2017  . Status post surgery 11/23/2014  . Vaginal vault prolapse after hysterectomy 11/09/2013  . BACTERIAL PNEUMONIA 06/13/2010  . FEVER UNSPECIFIED 06/09/2010  . HYPERLIPIDEMIA 02/09/2009  . HYPERTENSION 02/09/2009    Past Medical History:  Diagnosis Date  . Broken humerus 10-24-13  . Cancer [facility])    breast cancer  . Chicken pox   . Complication of anesthesia 1998   Developed bigeminy post op hysterectomy  . Hyperlipidemia   . Hypertension     Past Surgical History:  Procedure Laterality Date  . ABDOMINAL HYSTERECTOMY  1998   TVH--ovaries remain  . ABDOMINAL SACROCOLPOPEXY N/A 11/23/2014   Procedure: ABDOMINAL SACROCOLPOPEXY/HALBAN'S CULDOPLASTY;  Surgeon: Nunzio Cobbs, MD;  Location: [place] [facility];  Service: Gynecology;  Laterality: N/A;  . ANTERIOR AND POSTERIOR REPAIR N/A 11/23/2014   Procedure: ANTERIOR (CYSTOCELE)  ;  Surgeon: Nunzio Cobbs, MD;  Location: [place] [facility];  Service: Gynecology;  Laterality: N/A;  . BLADDER SUSPENSION N/A 11/23/2014   Procedure: EXACT MIDURETERAL SLING;  Surgeon: Nunzio Cobbs, MD;  Location: [place] [facility];  Service: Gynecology;  Laterality: N/A;  . BREAST LUMPECTOMY WITH RADIOACTIVE SEED AND SENTINEL LYMPH NODE BIOPSY Left 07/04/2017   Procedure: RADIOACTIVE SEED GUIDED LEFT BREAST LUMPECTOMY, LEFT AXILLARY SENTINEL LYMPH NODE BIOPSY;  Surgeon: Jovita Kussmaul, MD;  Location: [place];  Service: General;  Laterality: Left;  . CHOLECYSTECTOMY  2004  . CYSTOSCOPY N/A 11/23/2014   Procedure: CYSTOSCOPY;  Surgeon: Nunzio Cobbs, MD;  Location: [place] [facility];  Service: Gynecology;  Laterality: N/A;  . LYSIS OF ADHESION N/A 11/23/2014   Procedure: LYSIS OF ADHESION;  Surgeon: Nunzio Cobbs, MD;  Location: [place] [facility];  Service: Gynecology;  Laterality: N/A;  . RE-EXCISION OF BREAST LUMPECTOMY Left 08/05/2017   Procedure: RE-EXCISION OF BREAST CANCER LATERAL MARGINS;  Surgeon: Jovita Kussmaul, MD;  Location: [place];  Service: General;  Laterality: Left;  . SALPINGOOPHORECTOMY Bilateral 11/23/2014   Procedure: BILATERAL SALPINGO OOPHORECTOMY;  Surgeon: Nunzio Cobbs, MD;  Location: [place] [facility];  Service: Gynecology;  Laterality: Bilateral;  . TUBAL LIGATION      Current Outpatient Medications  Medication Sig Dispense Refill  . Calcium Carbonate-Vitamin D (CALCIUM-VITAMIN D) 600-200 MG-UNIT CAPS Take 1 tablet by mouth 2 (two) times daily.     Marland Kitchen lisinopril (PRINIVIL,ZESTRIL) 20 MG tablet Take 1 tablet (20 mg total) by mouth daily. (Patient taking differently: Take 20 mg by mouth every evening. ) 90 tablet 3  . pravastatin (PRAVACHOL) 40 MG tablet TAKE 1 TABLET BY MOUTH DAILY (Patient taking differently: TAKE 1 TABLET BY MOUTH IN THE EVENING) 90 tablet 3  . vitamin C (ASCORBIC ACID) 500 MG tablet Take 1,000 mg by mouth daily.      No current  facility-administered medications for this visit.      ALLERGIES: Diflucan [fluconazole]; Hydrocodone; Tramadol; and Oxycodone  Family History  Problem Relation Age of Onset  . Stroke Father   . Alcohol abuse Father   . Cancer Brother        bladder cancer  . Alcohol abuse Other   . Arthritis Other   . Hypertension Other   . Stroke Other   . Breast cancer Maternal Grandmother 28  . Breast cancer Paternal Grandmother 84    Social History   Socioeconomic History  . Marital status: Married    Spouse name: Not on file  . Number of children: Not on file  . Years of education: Not on file  . Highest education level: Not on file  Occupational History  . Not on file  Social Needs  . Financial resource strain: Not on file  . Food insecurity:    Worry: Not on file    Inability: Not on file  . Transportation needs:    Medical: Not on file    Non-medical: Not on file  Tobacco Use  . Smoking status: Never Smoker  . Smokeless tobacco: Never Used  Substance and Sexual Activity  . Alcohol use: No    Alcohol/week: 0.0 oz  . Drug use: No  . Sexual activity: Yes    Partners: Male    Birth control/protection: Surgical    Comment: TVH-ovaries remain  Lifestyle  . Physical activity:    Days per week: Not on file    Minutes per session: Not on file  . Stress: Not on file  Relationships  . Social connections:    Talks on phone: Not on file    Gets together: Not on file    Attends religious service: Not on file    Active member of club or organization: Not on file    Attends meetings of clubs or organizations: Not on file    Relationship status: Not on file  . Intimate partner violence:    Fear of current or ex partner: Not on file    Emotionally abused: Not on file    Physically abused: Not on file    Forced sexual activity: Not on file  Other Topics Concern  . Not on file  Social History Narrative  . Not on file    ROS:  Pertinent items are noted in HPI.  PHYSICAL  EXAMINATION:    BP 120/70 (BP Location: Right Arm, Patient Position: Sitting, Cuff Size: Normal)   Pulse 76   Temp 97.6 F (36.4 C) (Oral)   Resp 16   Ht 5\' 4"  (1.626 m)   Wt 130 lb (59 kg)   LMP 05/14/1996   BMI 22.31 kg/m     General  appearance: alert, cooperative and appears stated age  ASSESSMENT  Hemorrhagic UTI.  E Coli.  Treated. Left breast cancer.   PLAN  Call for recurrence of symptoms.  Not a candidate for local vaginal estrogen due to new dx of breast cancer.  FU for annual exams and prn.   An After Visit Summary was printed and given to the patient.  _____15_ minutes face to face time of which over 50% was spent in counseling.

## 2017-08-30 ENCOUNTER — Telehealth: Payer: Self-pay | Admitting: Adult Health

## 2017-08-30 DIAGNOSIS — Z170 Estrogen receptor positive status [ER+]: Secondary | ICD-10-CM | POA: Diagnosis not present

## 2017-08-30 DIAGNOSIS — Z510 Encounter for antineoplastic radiation therapy: Secondary | ICD-10-CM | POA: Diagnosis not present

## 2017-08-30 DIAGNOSIS — D0512 Intraductal carcinoma in situ of left breast: Secondary | ICD-10-CM | POA: Diagnosis not present

## 2017-08-30 NOTE — Telephone Encounter (Signed)
Patient called to reschedule she will be out of town °

## 2017-09-02 ENCOUNTER — Ambulatory Visit
Admission: RE | Admit: 2017-09-02 | Discharge: 2017-09-02 | Disposition: A | Discharge status: Home / Self Care | Payer: Medicare Other | Source: Ambulatory Visit | Attending: Radiation Oncology | Admitting: Radiation Oncology

## 2017-09-02 DIAGNOSIS — D0512 Intraductal carcinoma in situ of left breast: Secondary | ICD-10-CM | POA: Diagnosis not present

## 2017-09-02 DIAGNOSIS — Z510 Encounter for antineoplastic radiation therapy: Secondary | ICD-10-CM | POA: Diagnosis not present

## 2017-09-02 DIAGNOSIS — Z170 Estrogen receptor positive status [ER+]: Secondary | ICD-10-CM | POA: Diagnosis not present

## 2017-09-03 ENCOUNTER — Ambulatory Visit: Payer: Medicare Other

## 2017-09-03 ENCOUNTER — Ambulatory Visit
Admission: RE | Admit: 2017-09-03 | Discharge: 2017-09-03 | Disposition: A | Discharge status: Home / Self Care | Payer: Medicare Other | Source: Ambulatory Visit | Attending: Radiation Oncology | Admitting: Radiation Oncology

## 2017-09-03 DIAGNOSIS — D0512 Intraductal carcinoma in situ of left breast: Secondary | ICD-10-CM | POA: Diagnosis not present

## 2017-09-03 DIAGNOSIS — Z170 Estrogen receptor positive status [ER+]: Secondary | ICD-10-CM | POA: Diagnosis not present

## 2017-09-03 DIAGNOSIS — Z510 Encounter for antineoplastic radiation therapy: Secondary | ICD-10-CM | POA: Diagnosis not present

## 2017-09-04 ENCOUNTER — Ambulatory Visit
Admission: RE | Admit: 2017-09-04 | Discharge: 2017-09-04 | Disposition: A | Discharge status: Home / Self Care | Payer: Medicare Other | Source: Ambulatory Visit | Attending: Radiation Oncology | Admitting: Radiation Oncology

## 2017-09-04 ENCOUNTER — Ambulatory Visit: Payer: Medicare Other | Admitting: Radiation Oncology

## 2017-09-04 DIAGNOSIS — Z170 Estrogen receptor positive status [ER+]: Secondary | ICD-10-CM | POA: Diagnosis not present

## 2017-09-04 DIAGNOSIS — D0512 Intraductal carcinoma in situ of left breast: Secondary | ICD-10-CM | POA: Diagnosis not present

## 2017-09-04 DIAGNOSIS — Z510 Encounter for antineoplastic radiation therapy: Secondary | ICD-10-CM | POA: Diagnosis not present

## 2017-09-05 ENCOUNTER — Encounter: Payer: Medicare Other | Admitting: Physical Therapy

## 2017-09-05 ENCOUNTER — Ambulatory Visit: Payer: Medicare Other

## 2017-09-05 ENCOUNTER — Ambulatory Visit
Admission: RE | Admit: 2017-09-05 | Discharge: 2017-09-05 | Disposition: A | Discharge status: Home / Self Care | Payer: Medicare Other | Source: Ambulatory Visit | Attending: Radiation Oncology | Admitting: Radiation Oncology

## 2017-09-05 DIAGNOSIS — Z510 Encounter for antineoplastic radiation therapy: Secondary | ICD-10-CM | POA: Diagnosis not present

## 2017-09-05 DIAGNOSIS — D0512 Intraductal carcinoma in situ of left breast: Secondary | ICD-10-CM | POA: Diagnosis not present

## 2017-09-05 DIAGNOSIS — Z170 Estrogen receptor positive status [ER+]: Secondary | ICD-10-CM | POA: Diagnosis not present

## 2017-09-06 ENCOUNTER — Ambulatory Visit
Admission: RE | Admit: 2017-09-06 | Discharge: 2017-09-06 | Disposition: A | Discharge status: Home / Self Care | Payer: Medicare Other | Source: Ambulatory Visit | Attending: Radiation Oncology | Admitting: Radiation Oncology

## 2017-09-06 DIAGNOSIS — D0512 Intraductal carcinoma in situ of left breast: Secondary | ICD-10-CM | POA: Diagnosis not present

## 2017-09-06 DIAGNOSIS — Z170 Estrogen receptor positive status [ER+]: Secondary | ICD-10-CM | POA: Diagnosis not present

## 2017-09-06 DIAGNOSIS — Z510 Encounter for antineoplastic radiation therapy: Secondary | ICD-10-CM | POA: Diagnosis not present

## 2017-09-06 MED ORDER — RADIAPLEXRX EX GEL
Freq: Two times a day (BID) | CUTANEOUS | Status: DC
  Administered 2017-09-06: 17:00:00 via TOPICAL

## 2017-09-06 MED ORDER — ALRA NON-METALLIC DEODORANT (RAD-ONC)
1.0000 "application " | Freq: Once | TOPICAL | Status: AC
  Administered 2017-09-06: 1 via TOPICAL

## 2017-09-08 NOTE — Progress Notes (Signed)
  Radiation Oncology         (336) [phone] ________________________________  Name: Bridget Strong MRN: [id number]  Date: 08/28/2017  DOB: 1950-10-16   DIAGNOSIS:     ICD-10-CM   1. Ductal carcinoma in situ (DCIS) of left breast D05.12 hyaluronate sodium (RADIAPLEXRX) gel    non-metallic deodorant (ALRA) 1 application    SIMULATION AND TREATMENT PLANNING NOTE  The patient presented for simulation prior to beginning her course of radiation treatment for her diagnosis of left-sided breast cancer. The patient was placed in a supine position on a breast board. A customized vac-lock bag was constructed and this complex treatment device will be used on a daily basis during her treatment. In this fashion, a CT scan was obtained through the chest area and an isocenter was placed near the chest wall within the breast.  The patient will be planned to receive a course of radiation initially to a dose of 42.5 Gy. This will consist of a whole breast radiotherapy technique. To accomplish this, 2 customized blocks have been designed which will correspond to medial and lateral whole breast tangent fields. This treatment will be accomplished at 2.5 Gy per fraction. A forward planning technique will also be evaluated to determine if this approach improves the plan. It is anticipated that the patient will then receive a 7.5 Gy boost to the seroma cavity which has been contoured. This will be accomplished at 2.5 Gy per fraction.   This initial treatment will consist of a 3-D conformal technique. The seroma has been contoured as the primary target structure. Additionally, dose volume histograms of both this target as well as the lungs and heart will also be evaluated. Such an approach is necessary to ensure that the target area is adequately covered while the nearby critical  normal structures are adequately spared.  Plan:  The final anticipated total dose therefore will correspond to 50 Gy.   Special treatment  procedure was performed today due to the extra time and effort required by myself to plan and prepare this patient for deep inspiration breath hold technique.  I have determined cardiac sparing to be of benefit to this patient to prevent long term cardiac damage due to radiation of the heart.  Bellows were placed on the patient's abdomen. To facilitate cardiac sparing, the patient was coached by the radiation therapists on breath hold techniques and breathing practice was performed. Practice waveforms were obtained. The patient was then scanned while maintaining breath hold in the treatment position.  This image was then transferred over to the imaging specialist. The imaging specialist then created a fusion of the free breathing and breath hold scans using the chest wall as the stable structure. I personally reviewed the fusion in axial, coronal and sagittal image planes.  Excellent cardiac sparing was obtained.  I felt the patient is an appropriate candidate for breath hold and the patient will be treated as such.  The image fusion was then reviewed with the patient to reinforce the necessity of reproducible breath hold.  _______________________________   Jodelle Gross, MD, PhD

## 2017-09-08 NOTE — Progress Notes (Signed)
  Radiation Oncology         ([phone]) [phone] ________________________________  Name: ENZA SHONE MRN: [id number]  Date: 08/28/2017  DOB: 02/20/1951  Optical Surface Tracking Plan:  Since intensity modulated radiotherapy (IMRT) and 3D conformal radiation treatment methods are predicated on accurate and precise positioning for treatment, intrafraction motion monitoring is medically necessary to ensure accurate and safe treatment delivery.  The ability to quantify intrafraction motion without excessive ionizing radiation dose can only be performed with optical surface tracking. Accordingly, surface imaging offers the opportunity to obtain 3D measurements of patient position throughout IMRT and 3D treatments without excessive radiation exposure.  I am ordering optical surface tracking for this patient's upcoming course of radiotherapy. ________________________________  Kyung Rudd, MD 09/08/2017 12:57 PM    Reference:   Ursula Alert, J, et al. Surface imaging-based analysis of intrafraction motion for breast radiotherapy patients.Journal of [place], n. 6, nov. 2014. ISSN [id number].   Available at: <http://www.jacmp.org/index.php/jacmp/article/view/4957>.

## 2017-09-09 ENCOUNTER — Ambulatory Visit
Admission: RE | Admit: 2017-09-09 | Discharge: 2017-09-09 | Disposition: A | Discharge status: Home / Self Care | Payer: Medicare Other | Source: Ambulatory Visit | Attending: Radiation Oncology | Admitting: Radiation Oncology

## 2017-09-09 DIAGNOSIS — D0512 Intraductal carcinoma in situ of left breast: Secondary | ICD-10-CM | POA: Diagnosis not present

## 2017-09-09 DIAGNOSIS — Z170 Estrogen receptor positive status [ER+]: Secondary | ICD-10-CM | POA: Diagnosis not present

## 2017-09-09 DIAGNOSIS — Z510 Encounter for antineoplastic radiation therapy: Secondary | ICD-10-CM | POA: Diagnosis not present

## 2017-09-10 ENCOUNTER — Ambulatory Visit
Admission: RE | Admit: 2017-09-10 | Discharge: 2017-09-10 | Disposition: A | Discharge status: Home / Self Care | Payer: Medicare Other | Source: Ambulatory Visit | Attending: Radiation Oncology | Admitting: Radiation Oncology

## 2017-09-10 DIAGNOSIS — D0512 Intraductal carcinoma in situ of left breast: Secondary | ICD-10-CM | POA: Diagnosis not present

## 2017-09-10 DIAGNOSIS — Z510 Encounter for antineoplastic radiation therapy: Secondary | ICD-10-CM | POA: Diagnosis not present

## 2017-09-10 DIAGNOSIS — Z170 Estrogen receptor positive status [ER+]: Secondary | ICD-10-CM | POA: Diagnosis not present

## 2017-09-11 ENCOUNTER — Ambulatory Visit
Admission: RE | Admit: 2017-09-11 | Discharge: 2017-09-11 | Disposition: A | Discharge status: Home / Self Care | Payer: Medicare Other | Source: Ambulatory Visit | Attending: Radiation Oncology | Admitting: Radiation Oncology

## 2017-09-11 DIAGNOSIS — D0512 Intraductal carcinoma in situ of left breast: Secondary | ICD-10-CM | POA: Insufficient documentation

## 2017-09-11 DIAGNOSIS — Z510 Encounter for antineoplastic radiation therapy: Secondary | ICD-10-CM | POA: Diagnosis not present

## 2017-09-11 DIAGNOSIS — Z171 Estrogen receptor negative status [ER-]: Secondary | ICD-10-CM | POA: Diagnosis not present

## 2017-09-12 ENCOUNTER — Ambulatory Visit
Admission: RE | Admit: 2017-09-12 | Discharge: 2017-09-12 | Disposition: A | Discharge status: Home / Self Care | Payer: Medicare Other | Source: Ambulatory Visit | Attending: Radiation Oncology | Admitting: Radiation Oncology

## 2017-09-12 DIAGNOSIS — Z171 Estrogen receptor negative status [ER-]: Secondary | ICD-10-CM | POA: Diagnosis not present

## 2017-09-12 DIAGNOSIS — D0512 Intraductal carcinoma in situ of left breast: Secondary | ICD-10-CM | POA: Diagnosis not present

## 2017-09-12 DIAGNOSIS — Z510 Encounter for antineoplastic radiation therapy: Secondary | ICD-10-CM | POA: Diagnosis not present

## 2017-09-13 ENCOUNTER — Ambulatory Visit
Admission: RE | Admit: 2017-09-13 | Discharge: 2017-09-13 | Disposition: A | Discharge status: Home / Self Care | Payer: Medicare Other | Source: Ambulatory Visit | Attending: Radiation Oncology | Admitting: Radiation Oncology

## 2017-09-13 DIAGNOSIS — D0512 Intraductal carcinoma in situ of left breast: Secondary | ICD-10-CM | POA: Diagnosis not present

## 2017-09-13 DIAGNOSIS — Z510 Encounter for antineoplastic radiation therapy: Secondary | ICD-10-CM | POA: Diagnosis not present

## 2017-09-13 DIAGNOSIS — Z171 Estrogen receptor negative status [ER-]: Secondary | ICD-10-CM | POA: Diagnosis not present

## 2017-09-16 ENCOUNTER — Ambulatory Visit
Admission: RE | Admit: 2017-09-16 | Discharge: 2017-09-16 | Disposition: A | Discharge status: Home / Self Care | Payer: Medicare Other | Source: Ambulatory Visit | Attending: Radiation Oncology | Admitting: Radiation Oncology

## 2017-09-16 DIAGNOSIS — Z510 Encounter for antineoplastic radiation therapy: Secondary | ICD-10-CM | POA: Diagnosis not present

## 2017-09-16 DIAGNOSIS — Z171 Estrogen receptor negative status [ER-]: Secondary | ICD-10-CM | POA: Diagnosis not present

## 2017-09-16 DIAGNOSIS — D0512 Intraductal carcinoma in situ of left breast: Secondary | ICD-10-CM | POA: Diagnosis not present

## 2017-09-17 ENCOUNTER — Ambulatory Visit
Admission: RE | Admit: 2017-09-17 | Discharge: 2017-09-17 | Disposition: A | Discharge status: Home / Self Care | Payer: Medicare Other | Source: Ambulatory Visit | Attending: Radiation Oncology | Admitting: Radiation Oncology

## 2017-09-17 DIAGNOSIS — Z171 Estrogen receptor negative status [ER-]: Secondary | ICD-10-CM | POA: Diagnosis not present

## 2017-09-17 DIAGNOSIS — D0512 Intraductal carcinoma in situ of left breast: Secondary | ICD-10-CM | POA: Diagnosis not present

## 2017-09-17 DIAGNOSIS — Z510 Encounter for antineoplastic radiation therapy: Secondary | ICD-10-CM | POA: Diagnosis not present

## 2017-09-18 ENCOUNTER — Ambulatory Visit
Admission: RE | Admit: 2017-09-18 | Discharge: 2017-09-18 | Disposition: A | Discharge status: Home / Self Care | Payer: Medicare Other | Source: Ambulatory Visit | Attending: Radiation Oncology | Admitting: Radiation Oncology

## 2017-09-18 DIAGNOSIS — Z171 Estrogen receptor negative status [ER-]: Secondary | ICD-10-CM | POA: Diagnosis not present

## 2017-09-18 DIAGNOSIS — D0512 Intraductal carcinoma in situ of left breast: Secondary | ICD-10-CM | POA: Diagnosis not present

## 2017-09-18 DIAGNOSIS — Z510 Encounter for antineoplastic radiation therapy: Secondary | ICD-10-CM | POA: Diagnosis not present

## 2017-09-19 ENCOUNTER — Ambulatory Visit
Admission: RE | Admit: 2017-09-19 | Discharge: 2017-09-19 | Disposition: A | Discharge status: Home / Self Care | Payer: Medicare Other | Source: Ambulatory Visit | Attending: Radiation Oncology | Admitting: Radiation Oncology

## 2017-09-19 DIAGNOSIS — Z171 Estrogen receptor negative status [ER-]: Secondary | ICD-10-CM | POA: Diagnosis not present

## 2017-09-19 DIAGNOSIS — Z510 Encounter for antineoplastic radiation therapy: Secondary | ICD-10-CM | POA: Diagnosis not present

## 2017-09-19 DIAGNOSIS — D0512 Intraductal carcinoma in situ of left breast: Secondary | ICD-10-CM | POA: Diagnosis not present

## 2017-09-20 ENCOUNTER — Ambulatory Visit
Admission: RE | Admit: 2017-09-20 | Discharge: 2017-09-20 | Disposition: A | Discharge status: Home / Self Care | Payer: Medicare Other | Source: Ambulatory Visit | Attending: Radiation Oncology | Admitting: Radiation Oncology

## 2017-09-20 DIAGNOSIS — Z171 Estrogen receptor negative status [ER-]: Secondary | ICD-10-CM | POA: Diagnosis not present

## 2017-09-20 DIAGNOSIS — Z510 Encounter for antineoplastic radiation therapy: Secondary | ICD-10-CM | POA: Diagnosis not present

## 2017-09-20 DIAGNOSIS — D0512 Intraductal carcinoma in situ of left breast: Secondary | ICD-10-CM | POA: Diagnosis not present

## 2017-09-23 ENCOUNTER — Ambulatory Visit
Admission: RE | Admit: 2017-09-23 | Discharge: 2017-09-23 | Disposition: A | Discharge status: Home / Self Care | Payer: Medicare Other | Source: Ambulatory Visit | Attending: Radiation Oncology | Admitting: Radiation Oncology

## 2017-09-23 DIAGNOSIS — Z171 Estrogen receptor negative status [ER-]: Secondary | ICD-10-CM | POA: Diagnosis not present

## 2017-09-23 DIAGNOSIS — Z510 Encounter for antineoplastic radiation therapy: Secondary | ICD-10-CM | POA: Diagnosis not present

## 2017-09-23 DIAGNOSIS — D0512 Intraductal carcinoma in situ of left breast: Secondary | ICD-10-CM | POA: Diagnosis not present

## 2017-09-24 ENCOUNTER — Ambulatory Visit
Admission: RE | Admit: 2017-09-24 | Discharge: 2017-09-24 | Disposition: A | Discharge status: Home / Self Care | Payer: Medicare Other | Source: Ambulatory Visit | Attending: Radiation Oncology | Admitting: Radiation Oncology

## 2017-09-24 DIAGNOSIS — Z171 Estrogen receptor negative status [ER-]: Secondary | ICD-10-CM | POA: Diagnosis not present

## 2017-09-24 DIAGNOSIS — Z510 Encounter for antineoplastic radiation therapy: Secondary | ICD-10-CM | POA: Diagnosis not present

## 2017-09-24 DIAGNOSIS — D0512 Intraductal carcinoma in situ of left breast: Secondary | ICD-10-CM | POA: Diagnosis not present

## 2017-09-25 ENCOUNTER — Ambulatory Visit
Admission: RE | Admit: 2017-09-25 | Discharge: 2017-09-25 | Disposition: A | Discharge status: Home / Self Care | Payer: Medicare Other | Source: Ambulatory Visit | Attending: Radiation Oncology | Admitting: Radiation Oncology

## 2017-09-25 ENCOUNTER — Ambulatory Visit: Payer: Medicare Other | Admitting: Radiation Oncology

## 2017-09-25 DIAGNOSIS — D0512 Intraductal carcinoma in situ of left breast: Secondary | ICD-10-CM | POA: Diagnosis not present

## 2017-09-25 DIAGNOSIS — Z510 Encounter for antineoplastic radiation therapy: Secondary | ICD-10-CM | POA: Diagnosis not present

## 2017-09-25 DIAGNOSIS — Z171 Estrogen receptor negative status [ER-]: Secondary | ICD-10-CM | POA: Diagnosis not present

## 2017-09-26 ENCOUNTER — Ambulatory Visit
Admission: RE | Admit: 2017-09-26 | Discharge: 2017-09-26 | Disposition: A | Discharge status: Home / Self Care | Payer: Medicare Other | Source: Ambulatory Visit | Attending: Radiation Oncology | Admitting: Radiation Oncology

## 2017-09-26 ENCOUNTER — Ambulatory Visit: Payer: Medicare Other

## 2017-09-26 DIAGNOSIS — Z171 Estrogen receptor negative status [ER-]: Secondary | ICD-10-CM | POA: Diagnosis not present

## 2017-09-26 DIAGNOSIS — Z510 Encounter for antineoplastic radiation therapy: Secondary | ICD-10-CM | POA: Diagnosis not present

## 2017-09-26 DIAGNOSIS — D0512 Intraductal carcinoma in situ of left breast: Secondary | ICD-10-CM | POA: Diagnosis not present

## 2017-09-27 ENCOUNTER — Ambulatory Visit
Admission: RE | Admit: 2017-09-27 | Discharge: 2017-09-27 | Disposition: A | Discharge status: Home / Self Care | Payer: Medicare Other | Source: Ambulatory Visit | Attending: Radiation Oncology | Admitting: Radiation Oncology

## 2017-09-27 DIAGNOSIS — Z171 Estrogen receptor negative status [ER-]: Secondary | ICD-10-CM | POA: Diagnosis not present

## 2017-09-27 DIAGNOSIS — D0512 Intraductal carcinoma in situ of left breast: Secondary | ICD-10-CM | POA: Diagnosis not present

## 2017-09-27 DIAGNOSIS — Z510 Encounter for antineoplastic radiation therapy: Secondary | ICD-10-CM | POA: Diagnosis not present

## 2017-09-30 ENCOUNTER — Ambulatory Visit: Payer: Medicare Other

## 2017-09-30 ENCOUNTER — Ambulatory Visit
Admission: RE | Admit: 2017-09-30 | Discharge: 2017-09-30 | Disposition: A | Discharge status: Home / Self Care | Payer: Medicare Other | Source: Ambulatory Visit | Attending: Radiation Oncology | Admitting: Radiation Oncology

## 2017-09-30 ENCOUNTER — Encounter: Payer: Self-pay | Admitting: Radiation Oncology

## 2017-09-30 DIAGNOSIS — Z171 Estrogen receptor negative status [ER-]: Secondary | ICD-10-CM | POA: Diagnosis not present

## 2017-09-30 DIAGNOSIS — Z510 Encounter for antineoplastic radiation therapy: Secondary | ICD-10-CM | POA: Diagnosis not present

## 2017-09-30 DIAGNOSIS — D0512 Intraductal carcinoma in situ of left breast: Secondary | ICD-10-CM | POA: Diagnosis not present

## 2017-10-01 ENCOUNTER — Ambulatory Visit: Payer: Medicare Other

## 2017-10-02 ENCOUNTER — Ambulatory Visit: Payer: Medicare Other

## 2017-10-10 NOTE — Progress Notes (Signed)
  Radiation Oncology         ([phone]) [phone] ________________________________  Name: Bridget Strong MRN: [id number]  Date: 09/30/2017  DOB: 11/30/1950  End of Treatment Note  Diagnosis:   67 y.o. 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 This was delivered using a 3-D conformal technique. The patient then received a boost to the seroma. This delivered an additional 7.5 Gy in 3 fractions using a 3 field photon technique due to the depth of the seroma. The total dose was 50 Gy.  Narrative: The patient tolerated radiation treatment relatively well.   The patient had some expected skin irritation as she progressed during treatment. Moist desquamation was not present at the end of treatment.  Plan: The patient has completed radiation treatment. The patient will return to radiation oncology clinic for routine followup in one month. I advised the patient to call or return sooner if they have any questions or concerns related to their recovery or treatment. ________________________________  Jodelle Gross, MD, PhD  This document serves as a record of services personally performed by Kyung Rudd, MD. It was created on his behalf by Rae Lips, a trained medical scribe. The creation of this record is based on the scribe's personal observations and the provider's statements to them. This document has been checked and approved by the attending provider.

## 2017-10-11 ENCOUNTER — Telehealth: Payer: Self-pay | Admitting: Adult Health

## 2017-10-11 NOTE — Telephone Encounter (Signed)
Called pt.  Resch SCP appt from 6/24 ([facility]) to 6/25.  Mailed calendar.

## 2017-10-23 ENCOUNTER — Telehealth: Payer: Self-pay

## 2017-10-23 NOTE — Telephone Encounter (Signed)
Spoke with patient reminding of SCP visit on 11/05/17 at 2 pm with NP.  Pt said she will come to appt.

## 2017-10-28 DIAGNOSIS — N3091 Cystitis, unspecified with hematuria: Secondary | ICD-10-CM | POA: Diagnosis not present

## 2017-10-31 ENCOUNTER — Encounter: Payer: Medicare Other | Admitting: Adult Health

## 2017-11-04 ENCOUNTER — Encounter: Payer: Medicare Other | Admitting: Adult Health

## 2017-11-05 ENCOUNTER — Ambulatory Visit
Admission: RE | Admit: 2017-11-05 | Discharge: 2017-11-05 | Disposition: A | Discharge status: Home / Self Care | Payer: Medicare Other | Source: Ambulatory Visit | Attending: Radiation Oncology | Admitting: Radiation Oncology

## 2017-11-05 ENCOUNTER — Other Ambulatory Visit: Payer: Self-pay

## 2017-11-05 ENCOUNTER — Encounter: Payer: Self-pay | Admitting: Radiation Oncology

## 2017-11-05 ENCOUNTER — Inpatient Hospital Stay: Payer: Medicare Other | Attending: Adult Health | Admitting: Adult Health

## 2017-11-05 VITALS — BP 142/76 | HR 75 | Temp 98.1°F | Resp 18 | Ht 64.0 in | Wt 131.0 lb

## 2017-11-05 DIAGNOSIS — Z885 Allergy status to narcotic agent status: Secondary | ICD-10-CM | POA: Diagnosis not present

## 2017-11-05 DIAGNOSIS — D0512 Intraductal carcinoma in situ of left breast: Secondary | ICD-10-CM | POA: Diagnosis not present

## 2017-11-05 DIAGNOSIS — Z888 Allergy status to other drugs, medicaments and biological substances status: Secondary | ICD-10-CM | POA: Insufficient documentation

## 2017-11-05 DIAGNOSIS — Z923 Personal history of irradiation: Secondary | ICD-10-CM | POA: Diagnosis not present

## 2017-11-05 DIAGNOSIS — Z171 Estrogen receptor negative status [ER-]: Secondary | ICD-10-CM | POA: Diagnosis not present

## 2017-11-05 DIAGNOSIS — Z79899 Other long term (current) drug therapy: Secondary | ICD-10-CM | POA: Insufficient documentation

## 2017-11-05 DIAGNOSIS — Z883 Allergy status to other anti-infective agents status: Secondary | ICD-10-CM | POA: Diagnosis not present

## 2017-11-05 NOTE — Progress Notes (Signed)
  Radiation Oncology         [phone] ________________________________  Name: Bridget Strong MRN: [id number]  Date of Service: 11/05/2017  DOB: 1951/03/03  Post Treatment Note  CC: Eulas Post, MD  Jovita Kussmaul, MD  Diagnosis:  High grade, ER/PR negative DCIS of the left breast    Interval Since Last Radiation:  5 weeks   09/03/2017 - 09/30/2017: The patient initially received a dose of 42.5 Gy in 17 fractions to the left breast using whole-breast tangent fields. This was delivered using a 3-D conformal technique. The patient then received a boost to the seroma. This delivered an additional 7.5 Gy in 3 fractions using a 3 field photon technique due to the depth of the seroma. The total dose was 50 Gy.    Narrative:  The patient returns today for routine follow-up. During treatment she did very well with radiotherapy and did not have significant desquamation.                             On review of systems, the patient states she's doing well without skin concerns. No other concerns are verbalized.  ALLERGIES:  is allergic to diflucan [fluconazole]; hydrocodone; tramadol; and oxycodone.  Meds: Current Outpatient Medications  Medication Sig Dispense Refill  . Calcium Carbonate-Vitamin D (CALCIUM-VITAMIN D) 600-200 MG-UNIT CAPS Take 1 tablet by mouth 2 (two) times daily.     Marland Kitchen lisinopril (PRINIVIL,ZESTRIL) 20 MG tablet Take 1 tablet (20 mg total) by mouth daily. (Patient taking differently: Take 20 mg by mouth every evening. ) 90 tablet 3  . vitamin C (ASCORBIC ACID) 500 MG tablet Take 1,000 mg by mouth daily.     . pravastatin (PRAVACHOL) 40 MG tablet TAKE 1 TABLET BY MOUTH DAILY (Patient taking differently: TAKE 1 TABLET BY MOUTH IN THE EVENING) 90 tablet 3   No current facility-administered medications for this encounter.     Physical Findings:  height is 5\' 4"  (1.626 m) and weight is 131 lb (59.4 kg). Her oral temperature is 98.1 F (36.7 C). Her blood pressure  is 142/76 (abnormal) and her pulse is 75. Her respiration is 18 and oxygen saturation is 98%.  Pain Assessment Pain Score: 0-No pain/10 In general this is a well appearing caucasian female in no acute distress. She's alert and oriented x4 and appropriate throughout the examination. Cardiopulmonary assessment is negative for acute distress and she exhibits normal effort. The left breast was examined and reveals mild hyperpigmentation of the inframammary fold. No lesions were otherwise noted and her lumpectomy scar is well healed.   Lab Findings: Lab Results  Component Value Date   WBC 4.8 06/26/2017   HGB 12.2 06/26/2017   HCT 36.8 06/26/2017   MCV 93.4 06/26/2017   PLT 246 06/26/2017     Radiographic Findings: No results found.  Impression/Plan: 1. High grade, ER/PR negative DCIS of the left breast. The patient has been doing well since completion of radiotherapy. We discussed that we would be happy to continue to follow her as needed, but she will also continue to follow up with Dr. Burr Medico in medical oncology. She was counseled on skin care as well as measures to avoid sun exposure to this area.  2. Survivorship. We discussed the importance of survivorship evaluation and she is  currently scheduled for this today after this visit.     Carola Rhine, PAC

## 2017-11-05 NOTE — Progress Notes (Deleted)
Patient delayed from radiation delays, and unable to complete appointment.  Explained that we need the full thirty minutes of the appointment to review the information and examine the patient.  Offered reschedule, but patient declined.  Will mail SCP information.    Wilber Bihari, NP

## 2018-01-16 DIAGNOSIS — H2513 Age-related nuclear cataract, bilateral: Secondary | ICD-10-CM | POA: Diagnosis not present

## 2018-01-16 DIAGNOSIS — H04123 Dry eye syndrome of bilateral lacrimal glands: Secondary | ICD-10-CM | POA: Diagnosis not present

## 2018-01-28 ENCOUNTER — Other Ambulatory Visit: Payer: Self-pay | Admitting: Family Medicine

## 2018-02-21 ENCOUNTER — Other Ambulatory Visit: Payer: Self-pay

## 2018-02-21 ENCOUNTER — Ambulatory Visit (INDEPENDENT_AMBULATORY_CARE_PROVIDER_SITE_OTHER): Payer: Medicare Other | Admitting: Family Medicine

## 2018-02-21 ENCOUNTER — Encounter: Payer: Self-pay | Admitting: Family Medicine

## 2018-02-21 VITALS — BP 108/64 | HR 73 | Temp 98.1°F | Ht 64.0 in | Wt 132.5 lb

## 2018-02-21 DIAGNOSIS — Z23 Encounter for immunization: Secondary | ICD-10-CM

## 2018-02-21 DIAGNOSIS — Z0000 Encounter for general adult medical examination without abnormal findings: Secondary | ICD-10-CM

## 2018-02-21 LAB — BASIC METABOLIC PANEL
BUN: 19 mg/dL (ref 6–23)
CALCIUM: 9.8 mg/dL (ref 8.4–10.5)
CO2: 30 meq/L (ref 19–32)
CREATININE: 0.75 mg/dL (ref 0.40–1.20)
Chloride: 105 mEq/L (ref 96–112)
GFR: 81.47 mL/min (ref >60.00)
Glucose, Bld: 99 mg/dL (ref 70–99)
Potassium: 4.4 mEq/L (ref 3.5–5.1)
Sodium: 142 mEq/L (ref 135–145)

## 2018-02-21 LAB — HEPATIC FUNCTION PANEL
ALBUMIN: 4.4 g/dL (ref 3.5–5.2)
ALT: 22 U/L (ref 0–35)
AST: 16 U/L (ref 0–37)
Alkaline Phosphatase: 96 U/L (ref 39–117)
Bilirubin, Direct: 0.1 mg/dL (ref 0.0–0.3)
Total Bilirubin: 0.5 mg/dL (ref 0.2–1.2)
Total Protein: 7.2 g/dL (ref 6.0–8.3)

## 2018-02-21 LAB — CBC WITH DIFFERENTIAL/PLATELET
Basophils Absolute: 0 10*3/uL (ref 0.0–0.1)
Basophils Relative: 0.6 % (ref 0.0–3.0)
EOS PCT: 1.3 % (ref 0.0–5.0)
Eosinophils Absolute: 0.1 10*3/uL (ref 0.0–0.7)
HEMATOCRIT: 39.1 % (ref 36.0–46.0)
HEMOGLOBIN: 13 g/dL (ref 12.0–15.0)
LYMPHS PCT: 33.6 % (ref 12.0–46.0)
Lymphs Abs: 1.4 10*3/uL (ref 0.7–4.0)
MCHC: 33.4 g/dL (ref 30.0–36.0)
MCV: 92.3 fl (ref 78.0–100.0)
MONOS PCT: 10.4 % (ref 3.0–12.0)
Monocytes Absolute: 0.4 10*3/uL (ref 0.1–1.0)
Neutro Abs: 2.3 10*3/uL (ref 1.4–7.7)
Neutrophils Relative %: 54.1 % (ref 43.0–77.0)
Platelets: 248 10*3/uL (ref 150.0–400.0)
RBC: 4.23 Mil/uL (ref 3.87–5.11)
RDW: 13.3 % (ref 11.5–15.5)
WBC: 4.3 10*3/uL (ref 4.0–10.5)

## 2018-02-21 LAB — LIPID PANEL
Cholesterol: 231 mg/dL — ABNORMAL HIGH (ref 0–200)
HDL: 50.9 mg/dL (ref >39.00)
NONHDL: 179.9
TRIGLYCERIDES: 299 mg/dL — AB (ref 0.0–149.0)
Total CHOL/HDL Ratio: 5
VLDL: 59.8 mg/dL — ABNORMAL HIGH (ref 0.0–40.0)

## 2018-02-21 LAB — LDL CHOLESTEROL, DIRECT: Direct LDL: 137 mg/dL

## 2018-02-21 LAB — TSH: TSH: 3.05 u[IU]/mL (ref 0.35–4.50)

## 2018-02-21 MED ORDER — PRAVASTATIN SODIUM 40 MG PO TABS: 40.0000 mg | ORAL_TABLET | Freq: Every day | ORAL | 3 refills | Status: DC

## 2018-02-21 MED ORDER — LISINOPRIL 20 MG PO TABS: 20.0000 mg | ORAL_TABLET | Freq: Every day | ORAL | 3 refills | Status: DC

## 2018-02-21 NOTE — Progress Notes (Signed)
Subjective:     Patient ID: Bridget Strong, female   DOB: 03-18-1951, 67 y.o.   MRN: [id number]  HPI Patient seen for physical exam.  She was diagnosed with left ductal carcinoma in situ last year and had surgery with radiation therapy.  She had to go back for second surgery, unfortunately.  She is doing well at this time.  She has completed her radiation therapy.  She has scheduled follow-up with GYN early next year.  Patient had Cologuard last October which was normal.  She has had one shingles vaccine and plans to get a second 1 soon.  Flu vaccine given today.  Pneumonia vaccines completed.  Previous hepatitis C screening negative.  She has resumed some exercise.  No longer running.  Past Medical History:  Diagnosis Date  . Broken humerus 10-24-13  . Cancer [facility])    breast cancer  . Chicken pox   . Complication of anesthesia 1998   Developed bigeminy post op hysterectomy  . Hyperlipidemia   . Hypertension    Past Surgical History:  Procedure Laterality Date  . ABDOMINAL HYSTERECTOMY  1998   TVH--ovaries remain  . ABDOMINAL SACROCOLPOPEXY N/A 11/23/2014   Procedure: ABDOMINAL SACROCOLPOPEXY/HALBAN'S CULDOPLASTY;  Surgeon: Nunzio Cobbs, MD;  Location: [place] [facility];  Service: Gynecology;  Laterality: N/A;  . ANTERIOR AND POSTERIOR REPAIR N/A 11/23/2014   Procedure: ANTERIOR (CYSTOCELE) ;  Surgeon: Nunzio Cobbs, MD;  Location: [place] [facility];  Service: Gynecology;  Laterality: N/A;  . BLADDER SUSPENSION N/A 11/23/2014   Procedure: EXACT MIDURETERAL SLING;  Surgeon: Nunzio Cobbs, MD;  Location: [place] [facility];  Service: Gynecology;  Laterality: N/A;  . BREAST LUMPECTOMY WITH RADIOACTIVE SEED AND SENTINEL LYMPH NODE BIOPSY Left 07/04/2017   Procedure: RADIOACTIVE SEED GUIDED LEFT BREAST LUMPECTOMY, LEFT AXILLARY SENTINEL LYMPH NODE BIOPSY;  Surgeon: Jovita Kussmaul, MD;  Location: [place];  Service: General;  Laterality: Left;  . CHOLECYSTECTOMY  2004  . CYSTOSCOPY N/A  11/23/2014   Procedure: CYSTOSCOPY;  Surgeon: Nunzio Cobbs, MD;  Location: [place] [facility];  Service: Gynecology;  Laterality: N/A;  . LYSIS OF ADHESION N/A 11/23/2014   Procedure: LYSIS OF ADHESION;  Surgeon: Nunzio Cobbs, MD;  Location: [place] [facility];  Service: Gynecology;  Laterality: N/A;  . RE-EXCISION OF BREAST LUMPECTOMY Left 08/05/2017   Procedure: RE-EXCISION OF BREAST CANCER LATERAL MARGINS;  Surgeon: Jovita Kussmaul, MD;  Location: [place];  Service: General;  Laterality: Left;  . SALPINGOOPHORECTOMY Bilateral 11/23/2014   Procedure: BILATERAL SALPINGO OOPHORECTOMY;  Surgeon: Nunzio Cobbs, MD;  Location: [facility];  Service: Gynecology;  Laterality: Bilateral;  . TUBAL LIGATION      reports that she has never smoked. She has never used smokeless tobacco. She reports that she does not drink alcohol or use drugs. family history includes Alcohol abuse in her father and other; Arthritis in her other; Breast cancer (age of onset: 91) in her paternal grandmother; Breast cancer (age of onset: 19) in her maternal grandmother; Cancer in her brother; Hypertension in her other; Stroke in her father and other. Allergies  Allergen Reactions  . Diflucan [Fluconazole] Hives, Itching and Swelling  . Hydrocodone Diarrhea and Nausea And Vomiting  . Tramadol Other (See Comments)    Makes her sick and feel weird  . Oxycodone Other (See Comments)    Dizziness     Review of Systems  Constitutional: Negative for activity change, appetite  change, fatigue, fever and unexpected weight change.  HENT: Negative for ear pain, hearing loss, sore throat and trouble swallowing.   Eyes: Negative for visual disturbance.  Respiratory: Negative for cough and shortness of breath.   Cardiovascular: Negative for chest pain and palpitations.  Gastrointestinal: Negative for abdominal pain, blood in stool, constipation and diarrhea.  Genitourinary: Negative for dysuria and hematuria.   Musculoskeletal: Negative for arthralgias, back pain and myalgias.  Skin: Negative for rash.  Neurological: Negative for dizziness, syncope and headaches.  Hematological: Negative for adenopathy.  Psychiatric/Behavioral: Negative for confusion and dysphoric mood.       Objective:   Physical Exam  Constitutional: She is oriented to person, place, and time. She appears well-developed and well-nourished.  HENT:  Head: Normocephalic and atraumatic.  Eyes: Pupils are equal, round, and reactive to light. EOM are normal.  Neck: Normal range of motion. Neck supple. No thyromegaly present.  Cardiovascular: Normal rate, regular rhythm and normal heart sounds.  No murmur heard. Pulmonary/Chest: Breath sounds normal. No respiratory distress. She has no wheezes. She has no rales.  Abdominal: Soft. Bowel sounds are normal. She exhibits no distension and no mass. There is no tenderness. There is no rebound and no guarding.  Genitourinary:  Genitourinary Comments: Per gyn   Musculoskeletal: Normal range of motion. She exhibits no edema.  Lymphadenopathy:    She has no cervical adenopathy.  Neurological: She is alert and oriented to person, place, and time. She displays normal reflexes. No cranial nerve deficit.  Skin: No rash noted.  Psychiatric: She has a normal mood and affect. Her behavior is normal. Judgment and thought content normal.       Assessment:     Physical exam.  Several issues addressed as below.  Patient had left breast cancer last year with ductal carcinoma in situ and has completed her surgery and radiation therapy    Plan:     -Flu vaccine given -Patient will complete shingles vaccine at pharmacy.  Other vaccines up-to-date -Continue regular weightbearing exercise -Obtain screening lab work  Eulas Post MD [facility] at [facility]

## 2018-02-21 NOTE — Addendum Note (Signed)
Addended by: Anibal Henderson on: 02/21/2018 10:31 AM   Modules accepted: Orders

## 2018-05-29 ENCOUNTER — Other Ambulatory Visit: Payer: Self-pay | Admitting: Obstetrics and Gynecology

## 2018-05-29 DIAGNOSIS — Z853 Personal history of malignant neoplasm of breast: Secondary | ICD-10-CM

## 2018-05-30 ENCOUNTER — Ambulatory Visit (INDEPENDENT_AMBULATORY_CARE_PROVIDER_SITE_OTHER): Payer: Medicare Other | Admitting: Family Medicine

## 2018-05-30 ENCOUNTER — Encounter: Payer: Self-pay | Admitting: Family Medicine

## 2018-05-30 VITALS — BP 140/70 | HR 90 | Temp 97.8°F | Wt 130.6 lb

## 2018-05-30 DIAGNOSIS — R350 Frequency of micturition: Secondary | ICD-10-CM | POA: Diagnosis not present

## 2018-05-30 DIAGNOSIS — N3001 Acute cystitis with hematuria: Secondary | ICD-10-CM

## 2018-05-30 LAB — POCT URINALYSIS DIPSTICK
BILIRUBIN UA: NEGATIVE
GLUCOSE UA: NEGATIVE
Ketones, UA: NEGATIVE
Nitrite, UA: POSITIVE
Protein, UA: NEGATIVE
Spec Grav, UA: 1.01 (ref 1.010–1.025)
Urobilinogen, UA: 1 E.U./dL
pH, UA: 6.5 (ref 5.0–8.0)

## 2018-05-30 MED ORDER — NITROFURANTOIN MONOHYD MACRO 100 MG PO CAPS: 100.0000 mg | ORAL_CAPSULE | Freq: Two times a day (BID) | ORAL | 0 refills | Status: DC

## 2018-05-30 NOTE — Progress Notes (Signed)
  ABCDE ONEIL DOB: 09-Jul-1950 Encounter date: 05/30/2018  This is a 68 y.o. female who presents with Chief Complaint  Patient presents with  . Dysuria    dysuria, frequency, using AZO, urinary pressure, UA complete, sent for culture    History of present illness:  Having symptoms for a few days now. Not getting relief with the AZO. Today is worst day so far. No fevers. No back pain. No belly pain. Pressure.     Allergies  Allergen Reactions  . Diflucan [Fluconazole] Hives, Itching and Swelling  . Hydrocodone Diarrhea and Nausea And Vomiting  . Tramadol Other (See Comments)    Makes her sick and feel weird  . Oxycodone Other (See Comments)    Dizziness   Current Meds  Medication Sig  . Calcium Carbonate-Vitamin D (CALCIUM-VITAMIN D) 600-200 MG-UNIT CAPS Take 1 tablet by mouth 2 (two) times daily.   Marland Kitchen lisinopril (PRINIVIL,ZESTRIL) 20 MG tablet Take 1 tablet (20 mg total) by mouth daily.  . pravastatin (PRAVACHOL) 40 MG tablet Take 1 tablet (40 mg total) by mouth daily.  . vitamin C (ASCORBIC ACID) 500 MG tablet Take 1,000 mg by mouth daily.     Review of Systems  Constitutional: Negative for chills, fatigue and fever.  Respiratory: Negative for cough, chest tightness, shortness of breath and wheezing.   Cardiovascular: Negative for chest pain, palpitations and leg swelling.  Gastrointestinal: Negative for abdominal pain.  Genitourinary: Positive for dysuria, frequency and hematuria.    Objective:  BP 140/70 (BP Location: Left Arm, Patient Position: Sitting, Cuff Size: Normal)   Pulse 90   Temp 97.8 F (36.6 C) (Oral)   Wt 130 lb 9.6 oz (59.2 kg)   LMP 05/14/1996   SpO2 98%   BMI 22.42 kg/m   Weight: 130 lb 9.6 oz (59.2 kg)   BP Readings from Last 3 Encounters:  05/30/18 140/70  02/21/18 108/64  11/05/17 (!) 142/76   Wt Readings from Last 3 Encounters:  05/30/18 130 lb 9.6 oz (59.2 kg)  02/21/18 132 lb 8 oz (60.1 kg)  11/05/17 131 lb (59.4 kg)     Physical Exam Constitutional:      General: She is not in acute distress.    Appearance: She is well-developed.  Cardiovascular:     Rate and Rhythm: Normal rate and regular rhythm.     Heart sounds: Normal heart sounds. No murmur. No friction rub.  Pulmonary:     Effort: Pulmonary effort is normal. No respiratory distress.     Breath sounds: Normal breath sounds. No wheezing or rales.  Abdominal:     General: Abdomen is flat. Bowel sounds are normal. There is no distension.     Tenderness: There is abdominal tenderness (mild suprapubic pressure). There is no right CVA tenderness or left CVA tenderness.  Musculoskeletal:     Right lower leg: No edema.     Left lower leg: No edema.  Neurological:     Mental Status: She is alert and oriented to person, place, and time.  Psychiatric:        Behavior: Behavior normal.     Assessment/Plan  1. Urinary frequency - POCT urinalysis dipstick - Urine Culture   2. UTI Did well with macrobid for last infection and feels comfortable with this medication. Culture has been ordered; will call w results.    Return if symptoms worsen or fail to improve.     Micheline Rough, MD

## 2018-06-02 LAB — URINE CULTURE
MICRO NUMBER:: 71418
SPECIMEN QUALITY:: ADEQUATE

## 2018-06-19 NOTE — Progress Notes (Signed)
68 y.o. G52P3013 Married Caucasian female here for annual exam.   Treated for UTI 3 weeks ago by PCP.  She has had 3 total in her lifetime, and they have been in this year.   Thinks the bladder has dropped a little bit more.  Not protruding outside the vaginal opening.  Feels like she is voiding well.  Does double void in the am with her first void only.  Does have frequency and night urination.  Denies urinary incontinence.  Drinks a lot of water.   Normal bowel function.   Having flashes at night.  Loosing sleep.   Has chronic pain in her right leg for years.   Denies depression, anxiety, and worry.   PCP: Carolann Littler, MD  Patient's last menstrual period was 05/14/1996.           Sexually active: No.  The current method of family planning is status post hysterectomy.    Exercising: Yes.    biking, walking and light weights Smoker:  no  Health Maintenance: Pap: 04-23-12 Neg History of abnormal Pap:  no MMG: 05-23-17 3D Lt.Br.calcifications w/nipple inversion;Rt.Br.neg--Lt.Br.Diag.&US-Neg but rec.WNI:OEVOJJ0.MRI highly suspicious for ductal carcinoma in situ--BX Lt.Br.pathology reveals HIGH GRADE DUCTAL CARCINOMA IN SITU WITH NECROSIS.  [facility] for [facility]/09[facility]18 at [facility].  Colonoscopy: 02/2017 Cologuard negative per patient BMD:  02-22-15  Result :Osteopenia:[place] TDaP: Td  02-21-10  Gardasil:   no HIV:no Hep C: 02-09-16 Neg Screening Labs:  Hb today: PCP   reports that she has never smoked. She has never used smokeless tobacco. She reports that she does not drink alcohol or use drugs.  Past Medical History:  Diagnosis Date  . Broken humerus 10-24-13  . Cancer [facility])    breast cancer  . Chicken pox   . Complication of anesthesia 1998   Developed bigeminy post op hysterectomy  . Hyperlipidemia   . Hypertension     Past Surgical History:  Procedure Laterality Date  . ABDOMINAL HYSTERECTOMY  1998   TVH--ovaries remain  . ABDOMINAL SACROCOLPOPEXY N/A  11/23/2014   Procedure: ABDOMINAL SACROCOLPOPEXY/HALBAN'S CULDOPLASTY;  Surgeon: Nunzio Cobbs, MD;  Location: [place] [facility];  Service: Gynecology;  Laterality: N/A;  . ANTERIOR AND POSTERIOR REPAIR N/A 11/23/2014   Procedure: ANTERIOR (CYSTOCELE) ;  Surgeon: Nunzio Cobbs, MD;  Location: [place] [facility];  Service: Gynecology;  Laterality: N/A;  . BLADDER SUSPENSION N/A 11/23/2014   Procedure: EXACT MIDURETERAL SLING;  Surgeon: Nunzio Cobbs, MD;  Location: [place] [facility];  Service: Gynecology;  Laterality: N/A;  . BREAST LUMPECTOMY WITH RADIOACTIVE SEED AND SENTINEL LYMPH NODE BIOPSY Left 07/04/2017   Procedure: RADIOACTIVE SEED GUIDED LEFT BREAST LUMPECTOMY, LEFT AXILLARY SENTINEL LYMPH NODE BIOPSY;  Surgeon: Jovita Kussmaul, MD;  Location: [place];  Service: General;  Laterality: Left;  . CHOLECYSTECTOMY  2004  . CYSTOSCOPY N/A 11/23/2014   Procedure: CYSTOSCOPY;  Surgeon: Nunzio Cobbs, MD;  Location: [place] [facility];  Service: Gynecology;  Laterality: N/A;  . LYSIS OF ADHESION N/A 11/23/2014   Procedure: LYSIS OF ADHESION;  Surgeon: Nunzio Cobbs, MD;  Location: [place] [facility];  Service: Gynecology;  Laterality: N/A;  . RE-EXCISION OF BREAST LUMPECTOMY Left 08/05/2017   Procedure: RE-EXCISION OF BREAST CANCER LATERAL MARGINS;  Surgeon: Jovita Kussmaul, MD;  Location: [place];  Service: General;  Laterality: Left;  . SALPINGOOPHORECTOMY Bilateral 11/23/2014   Procedure: BILATERAL SALPINGO OOPHORECTOMY;  Surgeon: Nunzio Cobbs, MD;  Location: [place] [facility];  Service: Gynecology;  Laterality: Bilateral;  . TUBAL LIGATION      Current Outpatient Medications  Medication Sig Dispense Refill  . Calcium Carbonate-Vitamin D (CALCIUM-VITAMIN D) 600-200 MG-UNIT CAPS Take 1 tablet by mouth 2 (two) times daily.     Marland Kitchen lisinopril (PRINIVIL,ZESTRIL) 20 MG tablet Take 1 tablet (20 mg total) by mouth daily. 90 tablet 3  . pravastatin (PRAVACHOL) 40 MG tablet Take 1 tablet (40 mg  total) by mouth daily. 90 tablet 3  . vitamin C (ASCORBIC ACID) 500 MG tablet Take 1,000 mg by mouth daily.      No current facility-administered medications for this visit.     Family History  Problem Relation Age of Onset  . Stroke Father   . Alcohol abuse Father   . Cancer Brother        bladder cancer  . Alcohol abuse Other   . Arthritis Other   . Hypertension Other   . Stroke Other   . Breast cancer Maternal Grandmother 18  . Breast cancer Paternal Grandmother 79    Review of Systems  All other systems reviewed and are negative.   Exam:   BP 100/60 (BP Location: Right Arm, Patient Position: Sitting, Cuff Size: Normal)   Pulse 80   Resp 16   Ht 5\' 4"  (1.626 m)   Wt 131 lb (59.4 kg)   LMP 05/14/1996   BMI 22.49 kg/m     General appearance: alert, cooperative and appears stated age Head: Normocephalic, without obvious abnormality, atraumatic Neck: no adenopathy, supple, symmetrical, trachea midline and thyroid normal to inspection and palpation Lungs: clear to auscultation bilaterally Breasts: normal appearance, no masses or tenderness, No nipple retraction or dimpling, No nipple discharge or bleeding, No axillary or supraclavicular adenopathy Heart: regular rate and rhythm Abdomen: soft, non-tender; no masses, no organomegaly Extremities: extremities normal, atraumatic, no cyanosis or edema Skin: Skin color, texture, turgor normal. No rashes or lesions Lymph nodes: Cervical, supraclavicular, and axillary nodes normal. No abnormal inguinal nodes palpated Neurologic: Grossly normal  Pelvic: External genitalia:  no lesions              Urethra:  normal appearing urethra with no masses, tenderness or lesions. Well supported.               Bartholins and Skenes: normal                 Vagina: normal appearing vagina with normal color and discharge, no lesions.  Almost second degree cystocele.                Cervix:  Absent.  Well supported.               Pap taken:  Yes.   Bimanual Exam:  Uterus: absent.              Adnexa: no mass, fullness, tenderness              Rectal exam: Yes.  .  Confirms.              Anus:  normal sphincter tone, no lesions  Chaperone was present for exam.  Assessment:   Well woman visit with normal exam. Recurrent cystocele.  Status post hysterectomy with sacrocolpopexy.   Vaginal mucosal thickening below the urethra.  Scar form surgery? Left breast DCIS.  Excision, re-excision, and XRT. Osteopenia.  Hot flashes at night.  UTIs.  Her discontinuation of vaginal estrogen and recurrent cystocele  are likely contributing to this.   Plan: Mammogram dx yearly.  Recommended self breast awareness. Pap and HR HPV as above. Guidelines for Calcium, Vitamin D, regular exercise program including cardiovascular and weight bearing exercise. BMD due through [place].  Start gabapentin 100 mg at hs.  Increase by 100 mg weekly to reach a total of 300 mg nightly. Dispense:  90, RF one.  Follow up annually and in 6 weeks for recheck.   After visit summary provided.

## 2018-06-20 ENCOUNTER — Ambulatory Visit (INDEPENDENT_AMBULATORY_CARE_PROVIDER_SITE_OTHER): Payer: Medicare Other | Admitting: Obstetrics and Gynecology

## 2018-06-20 ENCOUNTER — Encounter: Payer: Self-pay | Admitting: Obstetrics and Gynecology

## 2018-06-20 ENCOUNTER — Other Ambulatory Visit: Payer: Self-pay

## 2018-06-20 VITALS — BP 100/60 | HR 80 | Resp 16 | Ht 64.0 in | Wt 131.0 lb

## 2018-06-20 DIAGNOSIS — N951 Menopausal and female climacteric states: Secondary | ICD-10-CM | POA: Diagnosis not present

## 2018-06-20 DIAGNOSIS — Z01419 Encounter for gynecological examination (general) (routine) without abnormal findings: Secondary | ICD-10-CM | POA: Diagnosis not present

## 2018-06-20 MED ORDER — GABAPENTIN 100 MG PO CAPS: 100.0000 mg | ORAL_CAPSULE | Freq: Every day | ORAL | 2 refills | Status: DC

## 2018-06-20 NOTE — Patient Instructions (Signed)
EXERCISE AND DIET:  We recommended that you start or continue a regular exercise program for good health. Regular exercise means any activity that makes your heart beat faster and makes you sweat.  We recommend exercising at least 30 minutes per day at least 3 days a week, preferably 4 or 5.  We also recommend a diet low in fat and sugar.  Inactivity, poor dietary choices and obesity can cause diabetes, heart attack, stroke, and kidney damage, among others.    ALCOHOL AND SMOKING:  Women should limit their alcohol intake to no more than 7 drinks/beers/glasses of wine (combined, not each!) per week. Moderation of alcohol intake to this level decreases your risk of breast cancer and liver damage. And of course, no recreational drugs are part of a healthy lifestyle.  And absolutely no smoking or even second hand smoke. Most people know smoking can cause heart and lung diseases, but did you know it also contributes to weakening of your bones? Aging of your skin?  Yellowing of your teeth and nails?  CALCIUM AND VITAMIN D:  Adequate intake of calcium and Vitamin D are recommended.  The recommendations for exact amounts of these supplements seem to change often, but generally speaking 600 mg of calcium (either carbonate or citrate) and 800 units of Vitamin D per day seems prudent. Certain women may benefit from higher intake of Vitamin D.  If you are among these women, your doctor will have told you during your visit.    PAP SMEARS:  Pap smears, to check for cervical cancer or precancers,  have traditionally been done yearly, although recent scientific advances have shown that most women can have pap smears less often.  However, every woman still should have a physical exam from her gynecologist every year. It will include a breast check, inspection of the vulva and vagina to check for abnormal growths or skin changes, a visual exam of the cervix, and then an exam to evaluate the size and shape of the uterus and  ovaries.  And after 70 years of age, a rectal exam is indicated to check for rectal cancers. We will also provide age appropriate advice regarding health maintenance, like when you should have certain vaccines, screening for sexually transmitted diseases, bone density testing, colonoscopy, mammograms, etc.   MAMMOGRAMS:  All women over 40 years old should have a yearly mammogram. Many facilities now offer a "3D" mammogram, which may cost around $50 extra out of pocket. If possible,  we recommend you accept the option to have the 3D mammogram performed.  It both reduces the number of women who will be called back for extra views which then turn out to be normal, and it is better than the routine mammogram at detecting truly abnormal areas.    COLONOSCOPY:  Colonoscopy to screen for colon cancer is recommended for all women at age 50.  We know, you hate the idea of the prep.  We agree, BUT, having colon cancer and not knowing it is worse!!  Colon cancer so often starts as a polyp that can be seen and removed at colonscopy, which can quite literally save your life!  And if your first colonoscopy is normal and you have no family history of colon cancer, most women don't have to have it again for 10 years.  Once every ten years, you can do something that may end up saving your life, right?  We will be happy to help you get it scheduled when you are ready.    Be sure to check your insurance coverage so you understand how much it will cost.  It may be covered as a preventative service at no cost, but you should check your particular policy.     Gabapentin capsules or tablets What is this medicine? GABAPENTIN (GA ba pen tin) is used to control seizures in certain types of epilepsy. It is also used to treat certain types of nerve pain. This medicine may be used for other purposes; ask your health care provider or pharmacist if you have questions. COMMON BRAND NAME(S): Active-PAC with Gabapentin, Gabarone,  Neurontin What should I tell my health care provider before I take this medicine? They need to know if you have any of these conditions: -kidney disease -suicidal thoughts, plans, or attempt; a previous suicide attempt by you or a family member -an unusual or allergic reaction to gabapentin, other medicines, foods, dyes, or preservatives -pregnant or trying to get pregnant -breast-feeding How should I use this medicine? Take this medicine by mouth with a glass of water. Follow the directions on the prescription label. You can take it with or without food. If it upsets your stomach, take it with food. Take your medicine at regular intervals. Do not take it more often than directed. Do not stop taking except on your doctor's advice. If you are directed to break the 600 or 800 mg tablets in half as part of your dose, the extra half tablet should be used for the next dose. If you have not used the extra half tablet within 28 days, it should be thrown away. A special MedGuide will be given to you by the pharmacist with each prescription and refill. Be sure to read this information carefully each time. Talk to your pediatrician regarding the use of this medicine in children. While this drug may be prescribed for children as young as 3 years for selected conditions, precautions do apply. Overdosage: If you think you have taken too much of this medicine contact a poison control center or emergency room at once. NOTE: This medicine is only for you. Do not share this medicine with others. What if I miss a dose? If you miss a dose, take it as soon as you can. If it is almost time for your next dose, take only that dose. Do not take double or extra doses. What may interact with this medicine? Do not take this medicine with any of the following medications: -other gabapentin products This medicine may also interact with the following medications: -alcohol -antacids -antihistamines for allergy, cough and  cold -certain medicines for anxiety or sleep -certain medicines for depression or psychotic disturbances -homatropine; hydrocodone -naproxen -narcotic medicines (opiates) for pain -phenothiazines like chlorpromazine, mesoridazine, prochlorperazine, thioridazine This list may not describe all possible interactions. Give your health care provider a list of all the medicines, herbs, non-prescription drugs, or dietary supplements you use. Also tell them if you smoke, drink alcohol, or use illegal drugs. Some items may interact with your medicine. What should I watch for while using this medicine? Visit your doctor or health care professional for regular checks on your progress. You may want to keep a record at home of how you feel your condition is responding to treatment. You may want to share this information with your doctor or health care professional at each visit. You should contact your doctor or health care professional if your seizures get worse or if you have any new types of seizures. Do not stop taking this medicine or  any of your seizure medicines unless instructed by your doctor or health care professional. Stopping your medicine suddenly can increase your seizures or their severity. Wear a medical identification bracelet or chain if you are taking this medicine for seizures, and carry a card that lists all your medications. You may get drowsy, dizzy, or have blurred vision. Do not drive, use machinery, or do anything that needs mental alertness until you know how this medicine affects you. To reduce dizzy or fainting spells, do not sit or stand up quickly, especially if you are an older patient. Alcohol can increase drowsiness and dizziness. Avoid alcoholic drinks. Your mouth may get dry. Chewing sugarless gum or sucking hard candy, and drinking plenty of water will help. The use of this medicine may increase the chance of suicidal thoughts or actions. Pay special attention to how you are  responding while on this medicine. Any worsening of mood, or thoughts of suicide or dying should be reported to your health care professional right away. Women who become pregnant while using this medicine may enroll in the [place] Pregnancy Registry by calling [phone]. This registry collects information about the safety of antiepileptic drug use during pregnancy. What side effects may I notice from receiving this medicine? Side effects that you should report to your doctor or health care professional as soon as possible: -allergic reactions like skin rash, itching or hives, swelling of the face, lips, or tongue -worsening of mood, thoughts or actions of suicide or dying Side effects that usually do not require medical attention (report to your doctor or health care professional if they continue or are bothersome): -constipation -difficulty walking or controlling muscle movements -dizziness -nausea -slurred speech -tiredness -tremors -weight gain This list may not describe all possible side effects. Call your doctor for medical advice about side effects. You may report side effects to FDA at 1-800-FDA-1088. Where should I keep my medicine? Keep out of reach of children. This medicine may cause accidental overdose and death if it taken by other adults, children, or pets. Mix any unused medicine with a substance like cat litter or coffee grounds. Then throw the medicine away in a sealed container like a sealed bag or a coffee can with a lid. Do not use the medicine after the expiration date. Store at room temperature between 15 and 30 degrees C (59 and 86 degrees F). NOTE: This sheet is a summary. It may not cover all possible information. If you have questions about this medicine, talk to your doctor, pharmacist, or health care provider.  2019 Elsevier/Gold Standard (2017-10-03 13:21:44)

## 2018-08-01 ENCOUNTER — Ambulatory Visit: Payer: Medicare Other | Admitting: Obstetrics and Gynecology

## 2018-08-11 ENCOUNTER — Other Ambulatory Visit: Payer: Self-pay

## 2018-08-11 ENCOUNTER — Encounter: Payer: Self-pay | Admitting: Obstetrics and Gynecology

## 2018-08-11 ENCOUNTER — Ambulatory Visit: Payer: Medicare Other | Admitting: Obstetrics and Gynecology

## 2018-08-11 VITALS — BP 112/60 | HR 76 | Temp 98.3°F | Resp 12 | Ht 64.0 in | Wt 132.0 lb

## 2018-08-11 DIAGNOSIS — N951 Menopausal and female climacteric states: Secondary | ICD-10-CM | POA: Diagnosis not present

## 2018-08-11 DIAGNOSIS — R61 Generalized hyperhidrosis: Secondary | ICD-10-CM | POA: Diagnosis not present

## 2018-08-11 MED ORDER — GABAPENTIN 300 MG PO CAPS: 300.0000 mg | ORAL_CAPSULE | Freq: Every day | ORAL | 3 refills | Status: DC

## 2018-08-11 NOTE — Progress Notes (Signed)
GYNECOLOGY  VISIT   HPI: 68 y.o.   Married  Caucasian  female   [phone] with Patient's last menstrual period was 05/14/1996.   here for 6 week med recheck    She has night sweats and started Gabapentin in February.  She is taking 300 mg at hs.  Has a night sweat once in a while only.  Cut back on hot flashes by 80%.  Able to sleep now.  No dizziness.  She states she has no concerns about the medication.   She cannot take estrogen due to left breast DCIS.   GYNECOLOGIC HISTORY: Patient's last menstrual period was 05/14/1996. Contraception:  Hysterectomy Menopausal hormone therapy:  Gabapentin Last mammogram:  05-23-17 3D Lt.Br.calcifications w/nipple inversion;Rt.Br.neg--Lt.Br.Diag.&US-Neg but rec.AVW:UJWJXB1.MRI highly suspicious for ductal carcinoma in situ--BX Lt.Br.pathology reveals HIGH GRADE DUCTAL CARCINOMA IN SITU WITH NECROSIS.  [facility] at [facility].  Last pap smear:   06/14/17 Pap smear Negative        OB History    Gravida  4   Para  3   Term  3   Preterm      AB  1   Living  3     SAB  1   TAB      Ectopic      Multiple      Live Births                 Patient Active Problem List   Diagnosis Date Noted  . Ductal carcinoma in situ (DCIS) of left breast 06/19/2017  . Status post surgery 11/23/2014  . Vaginal vault prolapse after hysterectomy 11/09/2013  . BACTERIAL PNEUMONIA 06/13/2010  . FEVER UNSPECIFIED 06/09/2010  . HYPERLIPIDEMIA 02/09/2009  . HYPERTENSION 02/09/2009    Past Medical History:  Diagnosis Date  . Broken humerus 10-24-13  . Cancer [facility])    breast cancer  . Chicken pox   . Complication of anesthesia 1998   Developed bigeminy post op hysterectomy  . Hyperlipidemia   . Hypertension     Past Surgical History:  Procedure Laterality Date  . ABDOMINAL HYSTERECTOMY  1998   TVH--ovaries remain  . ABDOMINAL SACROCOLPOPEXY N/A 11/23/2014   Procedure: ABDOMINAL SACROCOLPOPEXY/HALBAN'S CULDOPLASTY;  Surgeon:  Nunzio Cobbs, MD;  Location: [facility];  Service: Gynecology;  Laterality: N/A;  . ANTERIOR AND POSTERIOR REPAIR N/A 11/23/2014   Procedure: ANTERIOR (CYSTOCELE) ;  Surgeon: Nunzio Cobbs, MD;  Location: [facility];  Service: Gynecology;  Laterality: N/A;  . BLADDER SUSPENSION N/A 11/23/2014   Procedure: EXACT MIDURETERAL SLING;  Surgeon: Nunzio Cobbs, MD;  Location: [facility] [facility];  Service: Gynecology;  Laterality: N/A;  . BREAST LUMPECTOMY WITH RADIOACTIVE SEED AND SENTINEL LYMPH NODE BIOPSY Left 07/04/2017   Procedure: RADIOACTIVE SEED GUIDED LEFT BREAST LUMPECTOMY, LEFT AXILLARY SENTINEL LYMPH NODE BIOPSY;  Surgeon: Jovita Kussmaul, MD;  Location: [place];  Service: General;  Laterality: Left;  . CHOLECYSTECTOMY  2004  . CYSTOSCOPY N/A 11/23/2014   Procedure: CYSTOSCOPY;  Surgeon: Nunzio Cobbs, MD;  Location: [facility];  Service: Gynecology;  Laterality: N/A;  . LYSIS OF ADHESION N/A 11/23/2014   Procedure: LYSIS OF ADHESION;  Surgeon: Nunzio Cobbs, MD;  Location: [facility];  Service: Gynecology;  Laterality: N/A;  . RE-EXCISION OF BREAST LUMPECTOMY Left 08/05/2017   Procedure: RE-EXCISION OF BREAST CANCER LATERAL MARGINS;  Surgeon: Jovita Kussmaul, MD;  Location: [place];  Service: General;  Laterality: Left;  . SALPINGOOPHORECTOMY Bilateral 11/23/2014   Procedure: BILATERAL SALPINGO OOPHORECTOMY;  Surgeon: Nunzio Cobbs, MD;  Location: [facility];  Service: Gynecology;  Laterality: Bilateral;  . TUBAL LIGATION      Current Outpatient Medications  Medication Sig Dispense Refill  . Calcium Carbonate-Vitamin D (CALCIUM-VITAMIN D) 600-200 MG-UNIT CAPS Take 1 tablet by mouth 2 (two) times daily.     Marland Kitchen gabapentin (NEURONTIN) 100 MG capsule Take 1 capsule (100 mg total) by mouth at bedtime. Increase by adding an additional 100 mg weekly for 3 weeks to reach a total of 300 mg at bedtime. 90 capsule 2  . lisinopril (PRINIVIL,ZESTRIL) 20 MG  tablet Take 1 tablet (20 mg total) by mouth daily. 90 tablet 3  . pravastatin (PRAVACHOL) 40 MG tablet Take 1 tablet (40 mg total) by mouth daily. 90 tablet 3  . vitamin C (ASCORBIC ACID) 500 MG tablet Take 1,000 mg by mouth daily.      No current facility-administered medications for this visit.      ALLERGIES: Diflucan [fluconazole]; Hydrocodone; Tramadol; and Oxycodone  Family History  Problem Relation Age of Onset  . Stroke Father   . Alcohol abuse Father   . Cancer Brother        bladder cancer  . Alcohol abuse Other   . Arthritis Other   . Hypertension Other   . Stroke Other   . Breast cancer Maternal Grandmother 55  . Breast cancer Paternal Grandmother 45    Social History   Socioeconomic History  . Marital status: Married    Spouse name: Not on file  . Number of children: Not on file  . Years of education: Not on file  . Highest education level: Not on file  Occupational History  . Not on file  Social Needs  . Financial resource strain: Not on file  . Food insecurity:    Worry: Not on file    Inability: Not on file  . Transportation needs:    Medical: Not on file    Non-medical: Not on file  Tobacco Use  . Smoking status: Never Smoker  . Smokeless tobacco: Never Used  Substance and Sexual Activity  . Alcohol use: No    Alcohol/week: 0.0 standard drinks  . Drug use: No  . Sexual activity: Not Currently    Partners: Male    Birth control/protection: Surgical    Comment: TVH-ovaries remain  Lifestyle  . Physical activity:    Days per week: Not on file    Minutes per session: Not on file  . Stress: Not on file  Relationships  . Social connections:    Talks on phone: Not on file    Gets together: Not on file    Attends religious service: Not on file    Active member of club or organization: Not on file    Attends meetings of clubs or organizations: Not on file    Relationship status: Not on file  . Intimate partner violence:    Fear of current or  ex partner: Not on file    Emotionally abused: Not on file    Physically abused: Not on file    Forced sexual activity: Not on file  Other Topics Concern  . Not on file  Social History Narrative  . Not on file    Review of Systems  Constitutional: Negative.   HENT: Negative.   Eyes: Negative.   Respiratory: Negative.   Cardiovascular: Negative.  Gastrointestinal: Negative.   Endocrine: Negative.   Genitourinary: Negative.   Musculoskeletal: Negative.   Skin: Negative.   Allergic/Immunologic: Negative.   Neurological: Negative.   Hematological: Negative.   Psychiatric/Behavioral: Negative.     PHYSICAL EXAMINATION:    BP 112/60 (BP Location: Right Arm, Patient Position: Sitting, Cuff Size: Normal)   Pulse 76   Temp 98.3 F (36.8 C) (Oral)   Resp 12   Ht 5\' 4"  (1.626 m)   Wt 132 lb (59.9 kg)   LMP 05/14/1996   BMI 22.66 kg/m     General appearance: alert, cooperative and appears stated age   ASSESSMENT  Menopausal symptoms.  Night sweats controlled on Gabapentin.  DCIS.  Not estrogen candidate.  PLAN  Continue Gabapentin 300 mg po q hs.  #90. RF 3.  Follow up for annual exam and prn.    An After Visit Summary was printed and given to the patient.  ___15___ minutes face to face time of which over 50% was spent in counseling.

## 2018-08-20 ENCOUNTER — Ambulatory Visit: Payer: Medicare Other | Admitting: Obstetrics and Gynecology

## 2018-08-22 ENCOUNTER — Other Ambulatory Visit: Payer: Self-pay

## 2018-08-22 ENCOUNTER — Ambulatory Visit
Admission: RE | Admit: 2018-08-22 | Discharge: 2018-08-22 | Disposition: A | Discharge status: Home / Self Care | Payer: Medicare Other | Source: Ambulatory Visit | Attending: Obstetrics and Gynecology | Admitting: Obstetrics and Gynecology

## 2018-08-22 DIAGNOSIS — Z853 Personal history of malignant neoplasm of breast: Secondary | ICD-10-CM | POA: Diagnosis not present

## 2018-09-09 ENCOUNTER — Inpatient Hospital Stay: Admission: RE | Admit: 2018-09-09 | Payer: Medicare Other | Source: Ambulatory Visit

## 2018-10-07 ENCOUNTER — Other Ambulatory Visit: Payer: Self-pay

## 2018-10-07 ENCOUNTER — Ambulatory Visit (INDEPENDENT_AMBULATORY_CARE_PROVIDER_SITE_OTHER): Payer: Medicare Other | Admitting: Family Medicine

## 2018-10-07 DIAGNOSIS — R300 Dysuria: Secondary | ICD-10-CM

## 2018-10-07 MED ORDER — NITROFURANTOIN MONOHYD MACRO 100 MG PO CAPS: 100.0000 mg | ORAL_CAPSULE | Freq: Two times a day (BID) | ORAL | 0 refills | Status: DC

## 2018-10-07 NOTE — Progress Notes (Signed)
Patient ID: Bridget Strong, female   DOB: 13-Feb-1951, 68 y.o.   MRN: [id number]  This visit type was conducted due to national recommendations for restrictions regarding the COVID-19 pandemic in an effort to limit this patient's exposure and mitigate transmission in our community.   Virtual Visit via Video Note  I connected with Theodoro Kos on 10/07/18 at  1:30 PM EDT by a video enabled telemedicine application and verified that I am speaking with the correct person using two identifiers.  Location patient: home Location provider:work or home office Persons participating in the virtual visit: patient, provider  I discussed the limitations of evaluation and management by telemedicine and the availability of in person appointments. The patient expressed understanding and agreed to proceed.   HPI: Patient just returned from the beach.  She developed symptoms there of some frequency and burning yesterday.  She has had UTIs in the past and the symptoms are exactly the same.  She had E. coli UTI back in January.  This was resistant to Keflex.  She was treated with Macrobid and seems to have tolerated Macrobid well in the past.  Denies any fever.  No chills.  No nausea or vomiting.   ROS: See pertinent positives and negatives per HPI.  Past Medical History:  Diagnosis Date  . Broken humerus 10-24-13  . Cancer [facility])    breast cancer  . Chicken pox   . Complication of anesthesia 1998   Developed bigeminy post op hysterectomy  . Hyperlipidemia   . Hypertension     Past Surgical History:  Procedure Laterality Date  . ABDOMINAL HYSTERECTOMY  1998   TVH--ovaries remain  . ABDOMINAL SACROCOLPOPEXY N/A 11/23/2014   Procedure: ABDOMINAL SACROCOLPOPEXY/HALBAN'S CULDOPLASTY;  Surgeon: Nunzio Cobbs, MD;  Location: [facility] [facility];  Service: Gynecology;  Laterality: N/A;  . ANTERIOR AND POSTERIOR REPAIR N/A 11/23/2014   Procedure: ANTERIOR (CYSTOCELE) ;  Surgeon: Nunzio Cobbs, MD;   Location: [facility];  Service: Gynecology;  Laterality: N/A;  . BLADDER SUSPENSION N/A 11/23/2014   Procedure: EXACT MIDURETERAL SLING;  Surgeon: Nunzio Cobbs, MD;  Location: [facility];  Service: Gynecology;  Laterality: N/A;  . BREAST LUMPECTOMY Left 2019  . BREAST LUMPECTOMY WITH RADIOACTIVE SEED AND SENTINEL LYMPH NODE BIOPSY Left 07/04/2017   Procedure: RADIOACTIVE SEED GUIDED LEFT BREAST LUMPECTOMY, LEFT AXILLARY SENTINEL LYMPH NODE BIOPSY;  Surgeon: Jovita Kussmaul, MD;  Location: [place];  Service: General;  Laterality: Left;  . CHOLECYSTECTOMY  2004  . CYSTOSCOPY N/A 11/23/2014   Procedure: CYSTOSCOPY;  Surgeon: Nunzio Cobbs, MD;  Location: [facility];  Service: Gynecology;  Laterality: N/A;  . LYSIS OF ADHESION N/A 11/23/2014   Procedure: LYSIS OF ADHESION;  Surgeon: Nunzio Cobbs, MD;  Location: [facility];  Service: Gynecology;  Laterality: N/A;  . RE-EXCISION OF BREAST LUMPECTOMY Left 08/05/2017   Procedure: RE-EXCISION OF BREAST CANCER LATERAL MARGINS;  Surgeon: Jovita Kussmaul, MD;  Location: [place];  Service: General;  Laterality: Left;  . SALPINGOOPHORECTOMY Bilateral 11/23/2014   Procedure: BILATERAL SALPINGO OOPHORECTOMY;  Surgeon: Nunzio Cobbs, MD;  Location: [facility];  Service: Gynecology;  Laterality: Bilateral;  . TUBAL LIGATION      Family History  Problem Relation Age of Onset  . Stroke Father   . Alcohol abuse Father   . Cancer Brother        bladder cancer  . Alcohol abuse Other   .  Arthritis Other   . Hypertension Other   . Stroke Other   . Breast cancer Maternal Grandmother 69  . Breast cancer Paternal Grandmother 59    SOCIAL HX: Non-smoker   Current Outpatient Medications:  .  Calcium Carbonate-Vitamin D (CALCIUM-VITAMIN D) 600-200 MG-UNIT CAPS, Take 1 tablet by mouth 2 (two) times daily. , Disp: , Rfl:  .  gabapentin (NEURONTIN) 300 MG capsule, Take 1 capsule (300 mg total) by mouth at bedtime., Disp: 90  capsule, Rfl: 3 .  lisinopril (PRINIVIL,ZESTRIL) 20 MG tablet, Take 1 tablet (20 mg total) by mouth daily., Disp: 90 tablet, Rfl: 3 .  pravastatin (PRAVACHOL) 40 MG tablet, Take 1 tablet (40 mg total) by mouth daily., Disp: 90 tablet, Rfl: 3 .  vitamin C (ASCORBIC ACID) 500 MG tablet, Take 1,000 mg by mouth daily. , Disp: , Rfl:   EXAM:  VITALS per patient if applicable:  GENERAL: alert, oriented, appears well and in no acute distress  HEENT: atraumatic, conjunttiva clear, no obvious abnormalities on inspection of external nose and ears  NECK: normal movements of the head and neck  LUNGS: on inspection no signs of respiratory distress, breathing rate appears normal, no obvious gross SOB, gasping or wheezing  CV: no obvious cyanosis  MS: moves all visible extremities without noticeable abnormality  PSYCH/NEURO: pleasant and cooperative, no obvious depression or anxiety, speech and thought processing grossly intact  ASSESSMENT AND PLAN:  Discussed the following assessment and plan:  Probable uncomplicated cystitis  -Agreed to start Macrobid 1 twice daily for 5 days -Plenty of fluids and follow-up for any persistent or worsening symptoms   I discussed the assessment and treatment plan with the patient. The patient was provided an opportunity to ask questions and all were answered. The patient agreed with the plan and demonstrated an understanding of the instructions.   The patient was advised to call back or seek an in-person evaluation if the symptoms worsen or if the condition fails to improve as anticipated.   Carolann Littler, MD

## 2019-02-02 ENCOUNTER — Telehealth: Payer: Self-pay | Admitting: *Deleted

## 2019-02-02 NOTE — Telephone Encounter (Signed)
Copied from [place] [id number]. Topic: General - Other >> Feb 02, 2019 10:35 AM Ivar Drape wrote: Reason for CRM:   Patient and her spouse, Dorma Eichelberger 8[birth year]29.46, would like to come in for their flu shots during your Saturday, Sep 26,2020 Flu clinic. Please return call with times of appts.

## 2019-02-07 ENCOUNTER — Ambulatory Visit (INDEPENDENT_AMBULATORY_CARE_PROVIDER_SITE_OTHER): Payer: Medicare Other

## 2019-02-07 ENCOUNTER — Ambulatory Visit: Payer: Medicare Other

## 2019-02-07 ENCOUNTER — Other Ambulatory Visit: Payer: Self-pay

## 2019-02-07 DIAGNOSIS — Z23 Encounter for immunization: Secondary | ICD-10-CM | POA: Diagnosis not present

## 2019-02-27 ENCOUNTER — Encounter: Payer: Self-pay | Admitting: Family Medicine

## 2019-02-27 ENCOUNTER — Other Ambulatory Visit: Payer: Self-pay

## 2019-02-27 ENCOUNTER — Ambulatory Visit (INDEPENDENT_AMBULATORY_CARE_PROVIDER_SITE_OTHER): Payer: Medicare Other | Admitting: Family Medicine

## 2019-02-27 VITALS — BP 102/68 | HR 64 | Temp 98.7°F | Ht 64.0 in | Wt 128.0 lb

## 2019-02-27 DIAGNOSIS — Z0000 Encounter for general adult medical examination without abnormal findings: Secondary | ICD-10-CM

## 2019-02-27 DIAGNOSIS — M8580 Other specified disorders of bone density and structure, unspecified site: Secondary | ICD-10-CM

## 2019-02-27 LAB — CBC WITH DIFFERENTIAL/PLATELET
Basophils Absolute: 0 10*3/uL (ref 0.0–0.1)
Basophils Relative: 0.6 % (ref 0.0–3.0)
Eosinophils Absolute: 0.1 10*3/uL (ref 0.0–0.7)
Eosinophils Relative: 1.4 % (ref 0.0–5.0)
HCT: 39.3 % (ref 36.0–46.0)
Hemoglobin: 13.4 g/dL (ref 12.0–15.0)
Lymphocytes Relative: 33.9 % (ref 12.0–46.0)
Lymphs Abs: 1.7 10*3/uL (ref 0.7–4.0)
MCHC: 34.1 g/dL (ref 30.0–36.0)
MCV: 92 fl (ref 78.0–100.0)
Monocytes Absolute: 0.5 10*3/uL (ref 0.1–1.0)
Monocytes Relative: 8.9 % (ref 3.0–12.0)
Neutro Abs: 2.8 10*3/uL (ref 1.4–7.7)
Neutrophils Relative %: 55.2 % (ref 43.0–77.0)
Platelets: 243 10*3/uL (ref 150.0–400.0)
RBC: 4.27 Mil/uL (ref 3.87–5.11)
RDW: 13.3 % (ref 11.5–15.5)
WBC: 5 10*3/uL (ref 4.0–10.5)

## 2019-02-27 LAB — TSH: TSH: 2.43 u[IU]/mL (ref 0.35–4.50)

## 2019-02-27 LAB — HEPATIC FUNCTION PANEL
ALT: 16 U/L (ref 0–35)
AST: 13 U/L (ref 0–37)
Albumin: 4.4 g/dL (ref 3.5–5.2)
Alkaline Phosphatase: 98 U/L (ref 39–117)
Bilirubin, Direct: 0.1 mg/dL (ref 0.0–0.3)
Total Bilirubin: 0.5 mg/dL (ref 0.2–1.2)
Total Protein: 7.1 g/dL (ref 6.0–8.3)

## 2019-02-27 LAB — LIPID PANEL
Cholesterol: 239 mg/dL — ABNORMAL HIGH (ref 0–200)
HDL: 45.5 mg/dL (ref >39.00)
NonHDL: 193.39
Total CHOL/HDL Ratio: 5
Triglycerides: 347 mg/dL — ABNORMAL HIGH (ref 0.0–149.0)
VLDL: 69.4 mg/dL — ABNORMAL HIGH (ref 0.0–40.0)

## 2019-02-27 LAB — BASIC METABOLIC PANEL
BUN: 18 mg/dL (ref 6–23)
CO2: 29 mEq/L (ref 19–32)
Calcium: 9.7 mg/dL (ref 8.4–10.5)
Chloride: 104 mEq/L (ref 96–112)
Creatinine, Ser: 0.79 mg/dL (ref 0.40–1.20)
GFR: 71.98 mL/min (ref >60.00)
Glucose, Bld: 95 mg/dL (ref 70–99)
Potassium: 4.6 mEq/L (ref 3.5–5.1)
Sodium: 142 mEq/L (ref 135–145)

## 2019-02-27 LAB — LDL CHOLESTEROL, DIRECT: Direct LDL: 138 mg/dL

## 2019-02-27 MED ORDER — LISINOPRIL 20 MG PO TABS: 20.0000 mg | ORAL_TABLET | Freq: Every day | ORAL | 3 refills | Status: DC

## 2019-02-27 MED ORDER — PRAVASTATIN SODIUM 40 MG PO TABS: 40.0000 mg | ORAL_TABLET | Freq: Every day | ORAL | 3 refills | Status: DC

## 2019-02-27 NOTE — Progress Notes (Signed)
Subjective:     Patient ID: Bridget Strong, female   DOB: Jan 29, 1951, 68 y.o.   MRN: [id number]  HPI Patient seen for physical exam.  She has history of ductal carcinoma in situ left breast which was treated with radiation therapy.  She sees gynecologist yearly.  She is getting yearly mammograms at this point.  She has had osteopenia by previous DEXA scan.  This was about 4 years ago.  She has hypertension and hyperlipidemia.  She is treated with pravastatin and lisinopril.  Her father died of alcohol and stroke complications.  She had a brother died of bladder cancer.  Her mother is 80 and alive.  She has some mild dementia.  She denies any recent falls.  Flu vaccine already given.  Immunizations up-to-date.  She had Cologuard 2 years ago which was negative.  She stays very active with walking and yard maintenance and general home maintenance  Past Medical History:  Diagnosis Date  . Broken humerus 10-24-13  . Cancer [facility])    breast cancer  . Chicken pox   . Complication of anesthesia 1998   Developed bigeminy post op hysterectomy  . Hyperlipidemia   . Hypertension    Past Surgical History:  Procedure Laterality Date  . ABDOMINAL HYSTERECTOMY  1998   TVH--ovaries remain  . ABDOMINAL SACROCOLPOPEXY N/A 11/23/2014   Procedure: ABDOMINAL SACROCOLPOPEXY/HALBAN'S CULDOPLASTY;  Surgeon: Nunzio Cobbs, MD;  Location: [facility];  Service: Gynecology;  Laterality: N/A;  . ANTERIOR AND POSTERIOR REPAIR N/A 11/23/2014   Procedure: ANTERIOR (CYSTOCELE) ;  Surgeon: Nunzio Cobbs, MD;  Location: [facility];  Service: Gynecology;  Laterality: N/A;  . BLADDER SUSPENSION N/A 11/23/2014   Procedure: EXACT MIDURETERAL SLING;  Surgeon: Nunzio Cobbs, MD;  Location: [facility];  Service: Gynecology;  Laterality: N/A;  . BREAST LUMPECTOMY Left 2019  . BREAST LUMPECTOMY WITH RADIOACTIVE SEED AND SENTINEL LYMPH NODE BIOPSY Left 07/04/2017   Procedure: RADIOACTIVE SEED GUIDED LEFT BREAST  LUMPECTOMY, LEFT AXILLARY SENTINEL LYMPH NODE BIOPSY;  Surgeon: Jovita Kussmaul, MD;  Location: [place];  Service: General;  Laterality: Left;  . CHOLECYSTECTOMY  2004  . CYSTOSCOPY N/A 11/23/2014   Procedure: CYSTOSCOPY;  Surgeon: Nunzio Cobbs, MD;  Location: [facility];  Service: Gynecology;  Laterality: N/A;  . LYSIS OF ADHESION N/A 11/23/2014   Procedure: LYSIS OF ADHESION;  Surgeon: Nunzio Cobbs, MD;  Location: [facility];  Service: Gynecology;  Laterality: N/A;  . RE-EXCISION OF BREAST LUMPECTOMY Left 08/05/2017   Procedure: RE-EXCISION OF BREAST CANCER LATERAL MARGINS;  Surgeon: Jovita Kussmaul, MD;  Location: [place];  Service: General;  Laterality: Left;  . SALPINGOOPHORECTOMY Bilateral 11/23/2014   Procedure: BILATERAL SALPINGO OOPHORECTOMY;  Surgeon: Nunzio Cobbs, MD;  Location: [facility];  Service: Gynecology;  Laterality: Bilateral;  . TUBAL LIGATION      reports that she has never smoked. She has never used smokeless tobacco. She reports that she does not drink alcohol or use drugs. family history includes Alcohol abuse in her father and another family member; Arthritis in an other family member; Breast cancer (age of onset: 66) in her paternal grandmother; Breast cancer (age of onset: 66) in her maternal grandmother; Cancer in her brother; Hypertension in an other family member; Stroke in her father and another family member. Allergies  Allergen Reactions  . Diflucan [Fluconazole] Hives, Itching and Swelling  . Hydrocodone Diarrhea and  Nausea And Vomiting  . Tramadol Other (See Comments)    Makes her sick and feel weird  . Oxycodone Other (See Comments)    Dizziness     Review of Systems  Constitutional: Negative for activity change, appetite change, fatigue, fever and unexpected weight change.  HENT: Negative for ear pain, hearing loss, sore throat and trouble swallowing.   Eyes: Negative for visual disturbance.  Respiratory: Negative  for cough and shortness of breath.   Cardiovascular: Negative for chest pain and palpitations.  Gastrointestinal: Negative for abdominal pain, blood in stool, constipation and diarrhea.  Endocrine: Negative for polydipsia and polyuria.  Genitourinary: Negative for dysuria and hematuria.  Musculoskeletal: Negative for arthralgias, back pain and myalgias.  Skin: Negative for rash.  Neurological: Negative for dizziness, syncope and headaches.  Hematological: Negative for adenopathy.  Psychiatric/Behavioral: Negative for confusion and dysphoric mood.       Objective:   Physical Exam Constitutional:      Appearance: She is well-developed.  HENT:     Head: Normocephalic and atraumatic.     Right Ear: Tympanic membrane and ear canal normal.     Left Ear: Tympanic membrane and ear canal normal.  Eyes:     Pupils: Pupils are equal, round, and reactive to light.  Neck:     Musculoskeletal: Normal range of motion and neck supple.     Thyroid: No thyromegaly.  Cardiovascular:     Rate and Rhythm: Normal rate and regular rhythm.     Heart sounds: Normal heart sounds. No murmur.  Pulmonary:     Effort: No respiratory distress.     Breath sounds: Normal breath sounds. No wheezing or rales.  Abdominal:     General: Bowel sounds are normal. There is no distension.     Palpations: Abdomen is soft. There is no mass.     Tenderness: There is no abdominal tenderness. There is no guarding or rebound.  Genitourinary:    Comments: Per gyn Musculoskeletal: Normal range of motion.  Lymphadenopathy:     Cervical: No cervical adenopathy.  Skin:    Findings: No rash.  Neurological:     Mental Status: She is alert and oriented to person, place, and time.     Cranial Nerves: No cranial nerve deficit.  Psychiatric:        Behavior: Behavior normal.        Thought Content: Thought content normal.        Judgment: Judgment normal.        Assessment:     Physical exam.  She has history of ductal  carcinoma in situ left breast, hypertension, hyperlipidemia.  Past history of osteopenia by DEXA scan 4 years ago.  Discussed the following health maintenance issues    Plan:     -Flu vaccine already given -Other immunizations up-to-date -Obtain follow-up lab work -Set up repeat DEXA scan -Continue with yearly mammogram -Consider repeat Cologuard by next year -Continue regular weightbearing exercise along with adequate calcium and vitamin D  Eulas Post MD [facility]

## 2019-02-27 NOTE — Patient Instructions (Signed)
Preventive Care 38 Years and Older, Female Preventive care refers to lifestyle choices and visits with your health care provider that can promote health and wellness. This includes:  A yearly physical exam. This is also called an annual well check.  Regular dental and eye exams.  Immunizations.  Screening for certain conditions.  Healthy lifestyle choices, such as diet and exercise. What can I expect for my preventive care visit? Physical exam Your health care provider will check:  Height and weight. These may be used to calculate body mass index (BMI), which is a measurement that tells if you are at a healthy weight.  Heart rate and blood pressure.  Your skin for abnormal spots. Counseling Your health care provider may ask you questions about:  Alcohol, tobacco, and drug use.  Emotional well-being.  Home and relationship well-being.  Sexual activity.  Eating habits.  History of falls.  Memory and ability to understand (cognition).  Work and work Statistician.  Pregnancy and menstrual history. What immunizations do I need?  Influenza (flu) vaccine  This is recommended every year. Tetanus, diphtheria, and pertussis (Tdap) vaccine  You may need a Td booster every 10 years. Varicella (chickenpox) vaccine  You may need this vaccine if you have not already been vaccinated. Zoster (shingles) vaccine  You may need this after age 33. Pneumococcal conjugate (PCV13) vaccine  One dose is recommended after age 33. Pneumococcal polysaccharide (PPSV23) vaccine  One dose is recommended after age 72. Measles, mumps, and rubella (MMR) vaccine  You may need at least one dose of MMR if you were born in 1957 or later. You may also need a second dose. Meningococcal conjugate (MenACWY) vaccine  You may need this if you have certain conditions. Hepatitis A vaccine  You may need this if you have certain conditions or if you travel or work in places where you may be exposed  to hepatitis A. Hepatitis B vaccine  You may need this if you have certain conditions or if you travel or work in places where you may be exposed to hepatitis B. Haemophilus influenzae type b (Hib) vaccine  You may need this if you have certain conditions. You may receive vaccines as individual doses or as more than one vaccine together in one shot (combination vaccines). Talk with your health care provider about the risks and benefits of combination vaccines. What tests do I need? Blood tests  Lipid and cholesterol levels. These may be checked every 5 years, or more frequently depending on your overall health.  Hepatitis C test.  Hepatitis B test. Screening  Lung cancer screening. You may have this screening every year starting at age 39 if you have a 30-pack-year history of smoking and currently smoke or have quit within the past 15 years.  Colorectal cancer screening. All adults should have this screening starting at age 36 and continuing until age 15. Your health care provider may recommend screening at age 23 if you are at increased risk. You will have tests every 1-10 years, depending on your results and the type of screening test.  Diabetes screening. This is done by checking your blood sugar (glucose) after you have not eaten for a while (fasting). You may have this done every 1-3 years.  Mammogram. This may be done every 1-2 years. Talk with your health care provider about how often you should have regular mammograms.  BRCA-related cancer screening. This may be done if you have a family history of breast, ovarian, tubal, or peritoneal cancers.  Other tests  Sexually transmitted disease (STD) testing.  Bone density scan. This is done to screen for osteoporosis. You may have this done starting at age 76. Follow these instructions at home: Eating and drinking  Eat a diet that includes fresh fruits and vegetables, whole grains, lean protein, and low-fat dairy products. Limit  your intake of foods with high amounts of sugar, saturated fats, and salt.  Take vitamin and mineral supplements as recommended by your health care provider.  Do not drink alcohol if your health care provider tells you not to drink.  If you drink alcohol: ? Limit how much you have to 0-1 drink a day. ? Be aware of how much alcohol is in your drink. In the [place] one drink equals one 12 oz bottle of beer (355 mL), one 5 oz glass of wine (148 mL), or one 1 oz glass of hard liquor (44 mL). Lifestyle  Take daily care of your teeth and gums.  Stay active. Exercise for at least 30 minutes on 5 or more days each week.  Do not use any products that contain nicotine or tobacco, such as cigarettes, e-cigarettes, and chewing tobacco. If you need help quitting, ask your health care provider.  If you are sexually active, practice safe sex. Use a condom or other form of protection in order to prevent STIs (sexually transmitted infections).  Talk with your health care provider about taking a low-dose aspirin or statin. What's next?  Go to your health care provider once a year for a well check visit.  Ask your health care provider how often you should have your eyes and teeth checked.  Stay up to date on all vaccines. This information is not intended to replace advice given to you by your health care provider. Make sure you discuss any questions you have with your health care provider. Document Released: 05/27/2015 Document Revised: 04/24/2018 Document Reviewed: 04/24/2018 Elsevier Patient Education  2020 [employer].

## 2019-03-01 ENCOUNTER — Encounter: Payer: Self-pay | Admitting: Family Medicine

## 2019-03-02 ENCOUNTER — Ambulatory Visit (INDEPENDENT_AMBULATORY_CARE_PROVIDER_SITE_OTHER)
Admission: RE | Admit: 2019-03-02 | Discharge: 2019-03-02 | Disposition: A | Discharge status: Home / Self Care | Payer: Medicare Other | Source: Ambulatory Visit | Attending: Family Medicine | Admitting: Family Medicine

## 2019-03-02 ENCOUNTER — Other Ambulatory Visit: Payer: Self-pay

## 2019-03-02 ENCOUNTER — Encounter: Payer: Self-pay | Admitting: Family Medicine

## 2019-03-02 DIAGNOSIS — M8580 Other specified disorders of bone density and structure, unspecified site: Secondary | ICD-10-CM

## 2019-03-02 DIAGNOSIS — Z780 Asymptomatic menopausal state: Secondary | ICD-10-CM

## 2019-03-02 DIAGNOSIS — Z8739 Personal history of other diseases of the musculoskeletal system and connective tissue: Secondary | ICD-10-CM | POA: Diagnosis not present

## 2019-06-08 ENCOUNTER — Other Ambulatory Visit: Payer: Self-pay | Admitting: Obstetrics and Gynecology

## 2019-06-08 DIAGNOSIS — Z9889 Other specified postprocedural states: Secondary | ICD-10-CM

## 2019-06-08 DIAGNOSIS — Z853 Personal history of malignant neoplasm of breast: Secondary | ICD-10-CM

## 2019-06-18 NOTE — Progress Notes (Signed)
69 y.o. G58P3013 Married Caucasian female here for annual exam.    Having good bladder control. No leakage.  Voiding well.  Last UTI was May, 2020.   No problems with bowel function.   Gabapentin is cutting hot flashes by about 80%. No dizziness.   Had her first Covid vaccine.   PCP: Carolann Littler, MD   Patient's last menstrual period was 05/14/1996.           Sexually active: Yes.    occ The current method of family planning is status post hysterectomy.    Exercising: Yes.    walking and stretching Smoker:  no  Health Maintenance: Pap: 06-14-17 Neg, 04-23-12 Neg History of abnormal Pap:  no MMG: 08-22-18 Hx Lt.lumpectomy for Br.Ca/Diag.Bil./neg/density C/Diag.MMG 24yr./BiRads2 Colonoscopy: 02/2017 Cologuard negative per patient BMD:  03-02-19  Result :Osteopenia with PCP.  Hx humeral fracture.  TDaP: PCP Gardasil:   no HIV:no Hep C: 02-09-16 Neg Screening Labs:  PCP. Flu vaccine:  Completed.    reports that she has never smoked. She has never used smokeless tobacco. She reports that she does not drink alcohol or use drugs.  Past Medical History:  Diagnosis Date  . Broken humerus 10-24-13  . Cancer [facility])    breast cancer  . Chicken pox   . Complication of anesthesia 1998   Developed bigeminy post op hysterectomy  . Hyperlipidemia   . Hypertension     Past Surgical History:  Procedure Laterality Date  . ABDOMINAL HYSTERECTOMY  1998   TVH--ovaries remain  . ABDOMINAL SACROCOLPOPEXY N/A 11/23/2014   Procedure: ABDOMINAL SACROCOLPOPEXY/HALBAN'S CULDOPLASTY;  Surgeon: Nunzio Cobbs, MD;  Location: [facility];  Service: Gynecology;  Laterality: N/A;  . ANTERIOR AND POSTERIOR REPAIR N/A 11/23/2014   Procedure: ANTERIOR (CYSTOCELE) ;  Surgeon: Nunzio Cobbs, MD;  Location: [facility];  Service: Gynecology;  Laterality: N/A;  . BLADDER SUSPENSION N/A 11/23/2014   Procedure: EXACT MIDURETERAL SLING;  Surgeon: Nunzio Cobbs, MD;  Location: [facility];   Service: Gynecology;  Laterality: N/A;  . BREAST LUMPECTOMY Left 2019  . BREAST LUMPECTOMY WITH RADIOACTIVE SEED AND SENTINEL LYMPH NODE BIOPSY Left 07/04/2017   Procedure: RADIOACTIVE SEED GUIDED LEFT BREAST LUMPECTOMY, LEFT AXILLARY SENTINEL LYMPH NODE BIOPSY;  Surgeon: Jovita Kussmaul, MD;  Location: [place];  Service: General;  Laterality: Left;  . CHOLECYSTECTOMY  2004  . CYSTOSCOPY N/A 11/23/2014   Procedure: CYSTOSCOPY;  Surgeon: Nunzio Cobbs, MD;  Location: [facility];  Service: Gynecology;  Laterality: N/A;  . LYSIS OF ADHESION N/A 11/23/2014   Procedure: LYSIS OF ADHESION;  Surgeon: Nunzio Cobbs, MD;  Location: [facility];  Service: Gynecology;  Laterality: N/A;  . RE-EXCISION OF BREAST LUMPECTOMY Left 08/05/2017   Procedure: RE-EXCISION OF BREAST CANCER LATERAL MARGINS;  Surgeon: Jovita Kussmaul, MD;  Location: [place];  Service: General;  Laterality: Left;  . SALPINGOOPHORECTOMY Bilateral 11/23/2014   Procedure: BILATERAL SALPINGO OOPHORECTOMY;  Surgeon: Nunzio Cobbs, MD;  Location: [facility];  Service: Gynecology;  Laterality: Bilateral;  . TUBAL LIGATION      Current Outpatient Medications  Medication Sig Dispense Refill  . Calcium Carbonate-Vitamin D (CALCIUM-VITAMIN D) 600-200 MG-UNIT CAPS Take 1 tablet by mouth 2 (two) times daily.     Marland Kitchen gabapentin (NEURONTIN) 300 MG capsule Take 1 capsule (300 mg total) by mouth at bedtime. 90 capsule 3  . lisinopril (ZESTRIL) 20 MG tablet Take  1 tablet (20 mg total) by mouth daily. 90 tablet 3  . pravastatin (PRAVACHOL) 40 MG tablet Take 1 tablet (40 mg total) by mouth daily. 90 tablet 3  . vitamin C (ASCORBIC ACID) 500 MG tablet Take 1,000 mg by mouth daily.      No current facility-administered medications for this visit.    Family History  Problem Relation Age of Onset  . Stroke Father   . Alcohol abuse Father   . Cancer Brother        bladder cancer  . Alcohol abuse Other   . Arthritis Other    . Hypertension Other   . Stroke Other   . Breast cancer Maternal Grandmother 69  . Breast cancer Paternal Grandmother 42    Review of Systems  All other systems reviewed and are negative.   Exam:   BP 140/78   Pulse 64   Temp (!) 97.3 F (36.3 C) (Temporal)   Resp 18   Ht 5\' 4"  (1.626 m)   Wt 128 lb 3.2 oz (58.2 kg)   LMP 05/14/1996   BMI 22.01 kg/m     General appearance: alert, cooperative and appears stated age Head: normocephalic, without obvious abnormality, atraumatic Neck: no adenopathy, supple, symmetrical, trachea midline and thyroid normal to inspection and palpation Lungs: clear to auscultation bilaterally Breasts: right - normal appearance, no masses or tenderness, No nipple retraction or dimpling, No nipple discharge or bleeding, No axillary adenopathy Left - scar across left nipple region.  no masses or tenderness, No axillary adenopathy Heart: regular rate and rhythm Abdomen: soft, non-tender; no masses, no organomegaly Extremities: extremities normal, atraumatic, no cyanosis or edema Skin: skin color, texture, turgor normal. No rashes or lesions Lymph nodes: cervical, supraclavicular, and axillary nodes normal. Neurologic: grossly normal  Pelvic: External genitalia:  no lesions              No abnormal inguinal nodes palpated.              Urethra:  normal appearing urethra with no masses, tenderness or lesions              Bartholins and Skenes: normal                 Vagina: normal appearing vagina with normal color and discharge, no lesions.  Second degree cystocele.               Cervix: absent              Pap taken: No. Bimanual Exam:  Uterus:  absent              Adnexa: no mass, fullness, tenderness              Rectal exam: Yes.  .  Confirms.              Anus:  normal sphincter tone, no lesions  Chaperone was present for exam.  Assessment:   Well woman visit with normal exam. Recurrent cystocele.  Status post hysterectomy. Status post  abdominal sacrocolpopexy, Halban's culdoplasty, BSO, lysis of adhesions, TVT exact midurethral sling, cystoscopy, anterior colporrhaphy. Left breast DCIS.  Excision, re-excision, and XRT. Osteopenia.  Hot flashes at night.  UTIs.  Resolved.   Plan: Mammogram screening discussed. Self breast awareness reviewed. Pap and HR HPV as above. Guidelines for Calcium, Vitamin D, regular exercise program including cardiovascular and weight bearing exercise. Refill of Gabapentin 300 mg.  I recommend vaginal hydration with [place], [place],  [place].  Labs with PCP. Follow up annually and prn.   After visit summary provided.

## 2019-06-19 ENCOUNTER — Other Ambulatory Visit: Payer: Self-pay

## 2019-06-21 ENCOUNTER — Ambulatory Visit: Payer: Medicare Other

## 2019-06-22 ENCOUNTER — Encounter: Payer: Self-pay | Admitting: Obstetrics and Gynecology

## 2019-06-22 ENCOUNTER — Other Ambulatory Visit: Payer: Self-pay

## 2019-06-22 ENCOUNTER — Ambulatory Visit (INDEPENDENT_AMBULATORY_CARE_PROVIDER_SITE_OTHER): Payer: Medicare Other | Admitting: Obstetrics and Gynecology

## 2019-06-22 VITALS — BP 140/78 | HR 64 | Temp 97.3°F | Resp 18 | Ht 64.0 in | Wt 128.2 lb

## 2019-06-22 DIAGNOSIS — Z01419 Encounter for gynecological examination (general) (routine) without abnormal findings: Secondary | ICD-10-CM | POA: Diagnosis not present

## 2019-06-22 MED ORDER — GABAPENTIN 300 MG PO CAPS: 300.0000 mg | ORAL_CAPSULE | Freq: Every day | ORAL | 3 refills | Status: DC

## 2019-06-22 NOTE — Patient Instructions (Signed)

## 2019-06-26 ENCOUNTER — Ambulatory Visit: Payer: Medicare Other | Admitting: Obstetrics and Gynecology

## 2019-07-07 ENCOUNTER — Ambulatory Visit: Payer: Medicare Other

## 2019-08-24 ENCOUNTER — Ambulatory Visit
Admission: RE | Admit: 2019-08-24 | Discharge: 2019-08-24 | Disposition: A | Discharge status: Home / Self Care | Payer: Medicare Other | Source: Ambulatory Visit | Attending: Obstetrics and Gynecology | Admitting: Obstetrics and Gynecology

## 2019-08-24 ENCOUNTER — Other Ambulatory Visit: Payer: Self-pay

## 2019-08-24 DIAGNOSIS — R928 Other abnormal and inconclusive findings on diagnostic imaging of breast: Secondary | ICD-10-CM | POA: Diagnosis not present

## 2019-08-24 DIAGNOSIS — Z853 Personal history of malignant neoplasm of breast: Secondary | ICD-10-CM

## 2019-08-24 DIAGNOSIS — Z9889 Other specified postprocedural states: Secondary | ICD-10-CM

## 2020-01-21 DIAGNOSIS — H2513 Age-related nuclear cataract, bilateral: Secondary | ICD-10-CM | POA: Diagnosis not present

## 2020-01-21 DIAGNOSIS — H02055 Trichiasis without entropian left lower eyelid: Secondary | ICD-10-CM | POA: Diagnosis not present

## 2020-01-21 DIAGNOSIS — H04123 Dry eye syndrome of bilateral lacrimal glands: Secondary | ICD-10-CM | POA: Diagnosis not present

## 2020-01-21 DIAGNOSIS — H0102B Squamous blepharitis left eye, upper and lower eyelids: Secondary | ICD-10-CM | POA: Diagnosis not present

## 2020-01-21 DIAGNOSIS — H0102A Squamous blepharitis right eye, upper and lower eyelids: Secondary | ICD-10-CM | POA: Diagnosis not present

## 2020-02-12 ENCOUNTER — Other Ambulatory Visit: Payer: Self-pay | Admitting: Family Medicine

## 2020-02-12 NOTE — Telephone Encounter (Signed)
Labs 02/27/19 OV 02/27/19 CPE scheduled for 02/29/20

## 2020-02-29 ENCOUNTER — Ambulatory Visit (INDEPENDENT_AMBULATORY_CARE_PROVIDER_SITE_OTHER): Payer: Medicare Other | Admitting: Family Medicine

## 2020-02-29 ENCOUNTER — Encounter: Payer: Self-pay | Admitting: Family Medicine

## 2020-02-29 ENCOUNTER — Other Ambulatory Visit: Payer: Self-pay

## 2020-02-29 VITALS — BP 110/72 | HR 58 | Ht 64.0 in | Wt 129.3 lb

## 2020-02-29 DIAGNOSIS — Z0000 Encounter for general adult medical examination without abnormal findings: Secondary | ICD-10-CM

## 2020-02-29 DIAGNOSIS — Z23 Encounter for immunization: Secondary | ICD-10-CM | POA: Diagnosis not present

## 2020-02-29 DIAGNOSIS — Z13220 Encounter for screening for lipoid disorders: Secondary | ICD-10-CM | POA: Diagnosis not present

## 2020-02-29 NOTE — Progress Notes (Signed)
Established Patient Office Visit  Subjective:  Patient ID: Bridget Strong, female    DOB: Aug 31, 1950  Age: 69 y.o. MRN: [id number]  CC:  Chief Complaint  Patient presents with  . Annual Exam    HPI [name] presents for physical exam.  She has history of hypertension, osteopenia, hyperlipidemia.  She remains on pravastatin and lisinopril.  Blood pressures been stable.  Due for follow-up labs.  She also has history of ductal carcinoma in situ of the left breast.  She is getting regular mammograms. Flu vaccine given today.  Other vaccines up-to-date.  She did Cologuard 3 years ago would like to repeat Cologuard at this time.  She walks regularly for exercise.  Had DEXA scan last year which showed osteopenia with T score -1.5.   Reviewed with no changes: Past Medical History:  Diagnosis Date  . Breast cancer ([place]) 2019   Left Breast Cancer  . Broken humerus 10-24-13  . Cancer [facility])    breast cancer  . Chicken pox   . Complication of anesthesia 1998   Developed bigeminy post op hysterectomy  . Hyperlipidemia   . Hypertension   . Personal history of radiation therapy 2019   Left Breast Cancer    Past Surgical History:  Procedure Laterality Date  . ABDOMINAL HYSTERECTOMY  1998   TVH--ovaries remain  . ABDOMINAL SACROCOLPOPEXY N/A 11/23/2014   Procedure: ABDOMINAL SACROCOLPOPEXY/HALBAN'S CULDOPLASTY;  Surgeon: Nunzio Cobbs, MD;  Location: [place] [facility];  Service: Gynecology;  Laterality: N/A;  . ANTERIOR AND POSTERIOR REPAIR N/A 11/23/2014   Procedure: ANTERIOR (CYSTOCELE) ;  Surgeon: Nunzio Cobbs, MD;  Location: [place] [facility];  Service: Gynecology;  Laterality: N/A;  . BLADDER SUSPENSION N/A 11/23/2014   Procedure: EXACT MIDURETERAL SLING;  Surgeon: Nunzio Cobbs, MD;  Location: [place] [facility];  Service: Gynecology;  Laterality: N/A;  . BREAST LUMPECTOMY Left 2019  . BREAST LUMPECTOMY WITH RADIOACTIVE SEED AND SENTINEL LYMPH NODE BIOPSY Left 07/04/2017    Procedure: RADIOACTIVE SEED GUIDED LEFT BREAST LUMPECTOMY, LEFT AXILLARY SENTINEL LYMPH NODE BIOPSY;  Surgeon: Jovita Kussmaul, MD;  Location: [place];  Service: General;  Laterality: Left;  . CHOLECYSTECTOMY  2004  . CYSTOSCOPY N/A 11/23/2014   Procedure: CYSTOSCOPY;  Surgeon: Nunzio Cobbs, MD;  Location: [place] [facility];  Service: Gynecology;  Laterality: N/A;  . LYSIS OF ADHESION N/A 11/23/2014   Procedure: LYSIS OF ADHESION;  Surgeon: Nunzio Cobbs, MD;  Location: [place] [facility];  Service: Gynecology;  Laterality: N/A;  . RE-EXCISION OF BREAST LUMPECTOMY Left 08/05/2017   Procedure: RE-EXCISION OF BREAST CANCER LATERAL MARGINS;  Surgeon: Jovita Kussmaul, MD;  Location: [place];  Service: General;  Laterality: Left;  . SALPINGOOPHORECTOMY Bilateral 11/23/2014   Procedure: BILATERAL SALPINGO OOPHORECTOMY;  Surgeon: Nunzio Cobbs, MD;  Location: [place] [facility];  Service: Gynecology;  Laterality: Bilateral;  . TUBAL LIGATION      Family History  Problem Relation Age of Onset  . Stroke Father   . Alcohol abuse Father   . Cancer Brother        bladder cancer  . Alcohol abuse Other   . Arthritis Other   . Hypertension Other   . Stroke Other   . Breast cancer Maternal Grandmother 36  . Breast cancer Paternal Grandmother 75    Social History   Socioeconomic History  . Marital status: Married    Spouse name: Not on  file  . Number of children: Not on file  . Years of education: Not on file  . Highest education level: Not on file  Occupational History  . Not on file  Tobacco Use  . Smoking status: Never Smoker  . Smokeless tobacco: Never Used  Vaping Use  . Vaping Use: Never used  Substance and Sexual Activity  . Alcohol use: No    Alcohol/week: 0.0 standard drinks  . Drug use: No  . Sexual activity: Yes    Partners: Male    Birth control/protection: Surgical    Comment: TVH-ovaries remain  Other Topics Concern  . Not on file  Social History Narrative    . Not on file   Social Determinants of Health   Financial Resource Strain:   . Difficulty of Paying Living Expenses: Not on file  Food Insecurity:   . Worried About Charity fundraiser in the Last Year: Not on file  . Ran Out of Food in the Last Year: Not on file  Transportation Needs:   . Lack of Transportation (Medical): Not on file  . Lack of Transportation (Non-Medical): Not on file  Physical Activity:   . Days of Exercise per Week: Not on file  . Minutes of Exercise per Session: Not on file  Stress:   . Feeling of Stress : Not on file  Social Connections:   . Frequency of Communication with Friends and Family: Not on file  . Frequency of Social Gatherings with Friends and Family: Not on file  . Attends Religious Services: Not on file  . Active Member of Clubs or Organizations: Not on file  . Attends Archivist Meetings: Not on file  . Marital Status: Not on file  Intimate Partner Violence:   . Fear of Current or Ex-Partner: Not on file  . Emotionally Abused: Not on file  . Physically Abused: Not on file  . Sexually Abused: Not on file    Outpatient Medications Prior to Visit  Medication Sig Dispense Refill  . Calcium Carbonate-Vitamin D (CALCIUM-VITAMIN D) 600-200 MG-UNIT CAPS Take 1 tablet by mouth 2 (two) times daily.     Marland Kitchen gabapentin (NEURONTIN) 300 MG capsule Take 1 capsule (300 mg total) by mouth at bedtime. 90 capsule 3  . lisinopril (ZESTRIL) 20 MG tablet TAKE 1 TABLET BY MOUTH EVERY DAY 90 tablet 3  . pravastatin (PRAVACHOL) 40 MG tablet TAKE 1 TABLET BY MOUTH EVERY DAY 90 tablet 3  . vitamin C (ASCORBIC ACID) 500 MG tablet Take 1,000 mg by mouth daily.      No facility-administered medications prior to visit.    Allergies  Allergen Reactions  . Diflucan [Fluconazole] Hives, Itching and Swelling  . Hydrocodone Diarrhea and Nausea And Vomiting  . Tramadol Other (See Comments)    Makes her sick and feel weird  . Oxycodone Other (See Comments)     Dizziness    ROS Review of Systems  Constitutional: Negative for activity change, appetite change, fatigue, fever and unexpected weight change.  HENT: Negative for ear pain, hearing loss, sore throat and trouble swallowing.   Eyes: Negative for visual disturbance.  Respiratory: Negative for cough and shortness of breath.   Cardiovascular: Negative for chest pain and palpitations.  Gastrointestinal: Negative for abdominal pain, blood in stool, constipation and diarrhea.  Genitourinary: Negative for dysuria and hematuria.  Musculoskeletal: Negative for arthralgias, back pain and myalgias.  Skin: Negative for rash.  Neurological: Negative for dizziness, syncope and headaches.  Hematological:  Negative for adenopathy.  Psychiatric/Behavioral: Negative for confusion and dysphoric mood.      Objective:    Physical Exam Constitutional:      Appearance: She is well-developed.  HENT:     Head: Normocephalic and atraumatic.  Eyes:     Pupils: Pupils are equal, round, and reactive to light.  Neck:     Thyroid: No thyromegaly.  Cardiovascular:     Rate and Rhythm: Normal rate and regular rhythm.     Heart sounds: Normal heart sounds. No murmur heard.   Pulmonary:     Effort: No respiratory distress.     Breath sounds: Normal breath sounds. No wheezing or rales.  Abdominal:     General: Bowel sounds are normal. There is no distension.     Palpations: Abdomen is soft. There is no mass.     Tenderness: There is no abdominal tenderness. There is no guarding or rebound.  Musculoskeletal:        General: Normal range of motion.     Cervical back: Normal range of motion and neck supple.  Lymphadenopathy:     Cervical: No cervical adenopathy.  Skin:    Findings: No rash.  Neurological:     Mental Status: She is alert and oriented to person, place, and time.     Cranial Nerves: No cranial nerve deficit.  Psychiatric:        Behavior: Behavior normal.        Thought Content: Thought  content normal.        Judgment: Judgment normal.     BP 110/72 (BP Location: Left Arm, Cuff Size: Normal)   Pulse (!) 58   Ht 5' 4" (1.626 m)   Wt 129 lb 4.8 oz (58.7 kg)   LMP 05/14/1996   BMI 22.19 kg/m  Wt Readings from Last 3 Encounters:  02/29/20 129 lb 4.8 oz (58.7 kg)  06/22/19 128 lb 3.2 oz (58.2 kg)  02/27/19 128 lb (58.1 kg)     Health Maintenance Due  Topic Date Due  . COLONOSCOPY  05/14/2016  . INFLUENZA VACCINE  12/13/2019    There are no preventive care reminders to display for this patient.  Lab Results  Component Value Date   TSH 2.43 02/27/2019   Lab Results  Component Value Date   WBC 5.0 02/27/2019   HGB 13.4 02/27/2019   HCT 39.3 02/27/2019   MCV 92.0 02/27/2019   PLT 243.0 02/27/2019   Lab Results  Component Value Date   NA 142 02/27/2019   K 4.6 02/27/2019   CO2 29 02/27/2019   GLUCOSE 95 02/27/2019   BUN 18 02/27/2019   CREATININE 0.79 02/27/2019   BILITOT 0.5 02/27/2019   ALKPHOS 98 02/27/2019   AST 13 02/27/2019   ALT 16 02/27/2019   PROT 7.1 02/27/2019   ALBUMIN 4.4 02/27/2019   CALCIUM 9.7 02/27/2019   ANIONGAP 8 06/26/2017   GFR 71.98 02/27/2019   Lab Results  Component Value Date   CHOL 239 (H) 02/27/2019   Lab Results  Component Value Date   HDL 45.50 02/27/2019   Lab Results  Component Value Date   LDLCALC 113 (H) 03/03/2013   Lab Results  Component Value Date   TRIG 347.0 (H) 02/27/2019   Lab Results  Component Value Date   CHOLHDL 5 02/27/2019   No results found for: HGBA1C    Assessment & Plan:   Problem List Items Addressed This Visit    None    Visit  Diagnoses    Physical exam    -  Primary   Relevant Orders   Flu Vaccine QUAD High Dose(Fluad)   Lipid panel   CMP14+EGFR    -Flu vaccine given -Order repeat Cologuard -Continue regular weightbearing exercise -Obtain labs including chemistries and lipids -She will continue with yearly mammograms  No orders of the defined types were  placed in this encounter.   Follow-up: No follow-ups on file.    Carolann Littler, MD

## 2020-02-29 NOTE — Patient Instructions (Signed)
Preventive Care 71 Years and Older, Female Preventive care refers to lifestyle choices and visits with your health care provider that can promote health and wellness. This includes:  A yearly physical exam. This is also called an annual well check.  Regular dental and eye exams.  Immunizations.  Screening for certain conditions.  Healthy lifestyle choices, such as diet and exercise. What can I expect for my preventive care visit? Physical exam Your health care provider will check:  Height and weight. These may be used to calculate body mass index (BMI), which is a measurement that tells if you are at a healthy weight.  Heart rate and blood pressure.  Your skin for abnormal spots. Counseling Your health care provider may ask you questions about:  Alcohol, tobacco, and drug use.  Emotional well-being.  Home and relationship well-being.  Sexual activity.  Eating habits.  History of falls.  Memory and ability to understand (cognition).  Work and work Statistician.  Pregnancy and menstrual history. What immunizations do I need?  Influenza (flu) vaccine  This is recommended every year. Tetanus, diphtheria, and pertussis (Tdap) vaccine  You may need a Td booster every 10 years. Varicella (chickenpox) vaccine  You may need this vaccine if you have not already been vaccinated. Zoster (shingles) vaccine  You may need this after age 71. Pneumococcal conjugate (PCV13) vaccine  One dose is recommended after age 71. Pneumococcal polysaccharide (PPSV23) vaccine  One dose is recommended after age 71. Measles, mumps, and rubella (MMR) vaccine  You may need at least one dose of MMR if you were born in 1957 or later. You may also need a second dose. Meningococcal conjugate (MenACWY) vaccine  You may need this if you have certain conditions. Hepatitis A vaccine  You may need this if you have certain conditions or if you travel or work in places where you may be exposed  to hepatitis A. Hepatitis B vaccine  You may need this if you have certain conditions or if you travel or work in places where you may be exposed to hepatitis B. Haemophilus influenzae type b (Hib) vaccine  You may need this if you have certain conditions. You may receive vaccines as individual doses or as more than one vaccine together in one shot (combination vaccines). Talk with your health care provider about the risks and benefits of combination vaccines. What tests do I need? Blood tests  Lipid and cholesterol levels. These may be checked every 5 years, or more frequently depending on your overall health.  Hepatitis C test.  Hepatitis B test. Screening  Lung cancer screening. You may have this screening every year starting at age 71 if you have a 30-pack-year history of smoking and currently smoke or have quit within the past 15 years.  Colorectal cancer screening. All adults should have this screening starting at age 71 and continuing until age 15. Your health care provider may recommend screening at age 71 if you are at increased risk. You will have tests every 1-10 years, depending on your results and the type of screening test.  Diabetes screening. This is done by checking your blood sugar (glucose) after you have not eaten for a while (fasting). You may have this done every 1-3 years.  Mammogram. This may be done every 1-2 years. Talk with your health care provider about how often you should have regular mammograms.  BRCA-related cancer screening. This may be done if you have a family history of breast, ovarian, tubal, or peritoneal cancers.  Other tests  Sexually transmitted disease (STD) testing.  Bone density scan. This is done to screen for osteoporosis. You may have this done starting at age 71. Follow these instructions at home: Eating and drinking  Eat a diet that includes fresh fruits and vegetables, whole grains, lean protein, and low-fat dairy products. Limit  your intake of foods with high amounts of sugar, saturated fats, and salt.  Take vitamin and mineral supplements as recommended by your health care provider.  Do not drink alcohol if your health care provider tells you not to drink.  If you drink alcohol: ? Limit how much you have to 0-1 drink a day. ? Be aware of how much alcohol is in your drink. In the [place] one drink equals one 12 oz bottle of beer (355 mL), one 5 oz glass of wine (148 mL), or one 1 oz glass of hard liquor (44 mL). Lifestyle  Take daily care of your teeth and gums.  Stay active. Exercise for at least 30 minutes on 5 or more days each week.  Do not use any products that contain nicotine or tobacco, such as cigarettes, e-cigarettes, and chewing tobacco. If you need help quitting, ask your health care provider.  If you are sexually active, practice safe sex. Use a condom or other form of protection in order to prevent STIs (sexually transmitted infections).  Talk with your health care provider about taking a low-dose aspirin or statin. What's next?  Go to your health care provider once a year for a well check visit.  Ask your health care provider how often you should have your eyes and teeth checked.  Stay up to date on all vaccines. This information is not intended to replace advice given to you by your health care provider. Make sure you discuss any questions you have with your health care provider. Document Revised: 04/24/2018 Document Reviewed: 04/24/2018 Elsevier Patient Education  2020 [employer].

## 2020-03-01 LAB — COMPLETE METABOLIC PANEL WITH GFR
AG Ratio: 1.7 (calc) (ref 1.0–2.5)
ALT: 12 U/L (ref 6–29)
AST: 12 U/L (ref 10–35)
Albumin: 4.3 g/dL (ref 3.6–5.1)
Alkaline phosphatase (APISO): 82 U/L (ref 37–153)
BUN: 19 mg/dL (ref 7–25)
CO2: 27 mmol/L (ref 20–32)
Calcium: 9.7 mg/dL (ref 8.6–10.4)
Chloride: 106 mmol/L (ref 98–110)
Creat: 0.73 mg/dL (ref 0.60–0.93)
GFR, Est African American: 97 mL/min/{1.73_m2} (ref >=60)
GFR, Est Non African American: 83 mL/min/{1.73_m2} (ref >=60)
Globulin: 2.6 g/dL (calc) (ref 1.9–3.7)
Glucose, Bld: 88 mg/dL (ref 65–99)
Potassium: 4.1 mmol/L (ref 3.5–5.3)
Sodium: 142 mmol/L (ref 135–146)
Total Bilirubin: 0.4 mg/dL (ref 0.2–1.2)
Total Protein: 6.9 g/dL (ref 6.1–8.1)

## 2020-03-01 LAB — LIPID PANEL
Cholesterol: 240 mg/dL — ABNORMAL HIGH (ref <200)
HDL: 49 mg/dL — ABNORMAL LOW (ref >=50)
Non-HDL Cholesterol (Calc): 191 mg/dL (calc) — ABNORMAL HIGH (ref <130)
Total CHOL/HDL Ratio: 4.9 (calc) (ref <5.0)
Triglycerides: 444 mg/dL — ABNORMAL HIGH (ref <150)

## 2020-03-02 ENCOUNTER — Other Ambulatory Visit: Payer: Self-pay | Admitting: Family Medicine

## 2020-03-02 DIAGNOSIS — E785 Hyperlipidemia, unspecified: Secondary | ICD-10-CM

## 2020-03-02 MED ORDER — ROSUVASTATIN CALCIUM 20 MG PO TABS
20.0000 mg | ORAL_TABLET | Freq: Every day | ORAL | 0 refills | Status: DC
Start: 2020-03-02 — End: 2020-05-17

## 2020-03-02 NOTE — Telephone Encounter (Signed)
-----   Message from Eulas Post, MD sent at 03/01/2020  8:40 PM EDT ----- Electrolytes stable.  Lipids are very high.  If she is willing, I suggest stop the Pravachol and start Crestor 20 mg po qd (more potent statin) and repeat lipids and hepatic in about 2 months.

## 2020-03-02 NOTE — Telephone Encounter (Signed)
SA-Pt agrees to Bridget Strong/C Pravachol/Start Crestor/created new order and pended/would you please send this in for me? thx dmf  Pt is also scheduled for lab visit for Lipid & HFP on 12.20.21 at 9:20am/future orders created/thx dmf

## 2020-03-11 DIAGNOSIS — Z1211 Encounter for screening for malignant neoplasm of colon: Secondary | ICD-10-CM | POA: Diagnosis not present

## 2020-03-24 LAB — COLOGUARD
COLOGUARD: NEGATIVE
Cologuard: NEGATIVE

## 2020-04-12 NOTE — Addendum Note (Signed)
Addended by: Marrion Coy on: 04/12/2020 03:21 PM   Modules accepted: Orders

## 2020-05-02 ENCOUNTER — Other Ambulatory Visit: Payer: Self-pay

## 2020-05-02 ENCOUNTER — Other Ambulatory Visit (INDEPENDENT_AMBULATORY_CARE_PROVIDER_SITE_OTHER): Payer: Medicare Other

## 2020-05-02 DIAGNOSIS — E785 Hyperlipidemia, unspecified: Secondary | ICD-10-CM

## 2020-05-02 LAB — HEPATIC FUNCTION PANEL
ALT: 18 U/L (ref 0–35)
AST: 14 U/L (ref 0–37)
Albumin: 4.4 g/dL (ref 3.5–5.2)
Alkaline Phosphatase: 81 U/L (ref 39–117)
Bilirubin, Direct: 0.1 mg/dL (ref 0.0–0.3)
Total Bilirubin: 0.5 mg/dL (ref 0.2–1.2)
Total Protein: 7.1 g/dL (ref 6.0–8.3)

## 2020-05-02 LAB — LIPID PANEL
Cholesterol: 172 mg/dL (ref 0–200)
HDL: 56.4 mg/dL (ref >39.00)
NonHDL: 115.98
Total CHOL/HDL Ratio: 3
Triglycerides: 213 mg/dL — ABNORMAL HIGH (ref 0.0–149.0)
VLDL: 42.6 mg/dL — ABNORMAL HIGH (ref 0.0–40.0)

## 2020-05-02 LAB — LDL CHOLESTEROL, DIRECT: Direct LDL: 90 mg/dL

## 2020-05-03 NOTE — Progress Notes (Signed)
Mychart message sent: Lipids much improved c/w last year.  Continue Crestor 20 mg daily.

## 2020-05-13 ENCOUNTER — Other Ambulatory Visit: Payer: Self-pay | Admitting: Family Medicine

## 2020-05-26 ENCOUNTER — Other Ambulatory Visit: Payer: Self-pay | Admitting: Obstetrics and Gynecology

## 2020-05-26 DIAGNOSIS — Z9889 Other specified postprocedural states: Secondary | ICD-10-CM

## 2020-07-20 ENCOUNTER — Other Ambulatory Visit: Payer: Self-pay

## 2020-07-20 ENCOUNTER — Encounter: Payer: Self-pay | Admitting: Obstetrics and Gynecology

## 2020-07-20 ENCOUNTER — Ambulatory Visit: Payer: Medicare Other | Admitting: Obstetrics and Gynecology

## 2020-07-20 VITALS — BP 130/68 | HR 76 | Ht 63.75 in | Wt 127.0 lb

## 2020-07-20 DIAGNOSIS — Z01411 Encounter for gynecological examination (general) (routine) with abnormal findings: Secondary | ICD-10-CM

## 2020-07-20 DIAGNOSIS — N8110 Cystocele, unspecified: Secondary | ICD-10-CM | POA: Diagnosis not present

## 2020-07-20 DIAGNOSIS — Z86000 Personal history of in-situ neoplasm of breast: Secondary | ICD-10-CM | POA: Diagnosis not present

## 2020-07-20 DIAGNOSIS — Z1239 Encounter for other screening for malignant neoplasm of breast: Secondary | ICD-10-CM

## 2020-07-20 DIAGNOSIS — N951 Menopausal and female climacteric states: Secondary | ICD-10-CM | POA: Diagnosis not present

## 2020-07-20 MED ORDER — GABAPENTIN 300 MG PO CAPS
300.0000 mg | ORAL_CAPSULE | Freq: Every day | ORAL | 3 refills | Status: DC
Start: 2020-07-20 — End: 2021-04-24

## 2020-07-20 NOTE — Progress Notes (Signed)
70 y.o. G100P3013 Married Caucasian female here for annual exam.    Patient is followed for her menopausal symptoms treated with Gabapentin, left breast DCIS, vaginal atrophy, and recurrent cystocele following her prolapse repair.   Voiding well.  No leaking urine.  Does not feel any vaginal bulge.  Bowel function is perfect per patient.   Gabapentin 300 mg at hs and it is working well.  Has an occasional hot flash at night. No side effects from the Gabapentin.  Sleeping well with it.   Not having vaginal dryness symptoms currently.   PCP: Carolann Littler, MD    Patient's last menstrual period was 05/14/1996.           Sexually active: No.  The current method of family planning is status post hysterectomy.    Exercising: No.  The patient does not participate in regular exercise at present. Smoker:  no  Health Maintenance: Pap:  06/14/17 Neg History of abnormal Pap:  no MMG:  08/24/19 BIRADS 2 benign/density b Colonoscopy:  2021 Cologuard Negative BMD:   03/02/19  Result  Osteopenia TDaP:  02/21/10 Gardasil:   n/a HIV: never Hep C: 02/09/16 Neg Screening Labs: PCP   reports that she has never smoked. She has never used smokeless tobacco. She reports that she does not drink alcohol and does not use drugs.  Past Medical History:  Diagnosis Date  . Breast cancer ([place]) 2019   Left Breast Cancer  . Broken humerus 10-24-13  . Cancer [facility])    breast cancer  . Chicken pox   . Complication of anesthesia 1998   Developed bigeminy post op hysterectomy  . Hyperlipidemia   . Hypertension   . Personal history of radiation therapy 2019   Left Breast Cancer    Past Surgical History:  Procedure Laterality Date  . ABDOMINAL HYSTERECTOMY  1998   TVH--ovaries remain  . ABDOMINAL SACROCOLPOPEXY N/A 11/23/2014   Procedure: ABDOMINAL SACROCOLPOPEXY/HALBAN'S CULDOPLASTY;  Surgeon: Nunzio Cobbs, MD;  Location: [place] [facility];  Service: Gynecology;  Laterality: N/A;  . ANTERIOR AND  POSTERIOR REPAIR N/A 11/23/2014   Procedure: ANTERIOR (CYSTOCELE) ;  Surgeon: Nunzio Cobbs, MD;  Location: [place] [facility];  Service: Gynecology;  Laterality: N/A;  . BLADDER SUSPENSION N/A 11/23/2014   Procedure: EXACT MIDURETERAL SLING;  Surgeon: Nunzio Cobbs, MD;  Location: [place] [facility];  Service: Gynecology;  Laterality: N/A;  . BREAST LUMPECTOMY Left 2019  . BREAST LUMPECTOMY WITH RADIOACTIVE SEED AND SENTINEL LYMPH NODE BIOPSY Left 07/04/2017   Procedure: RADIOACTIVE SEED GUIDED LEFT BREAST LUMPECTOMY, LEFT AXILLARY SENTINEL LYMPH NODE BIOPSY;  Surgeon: Jovita Kussmaul, MD;  Location: [place];  Service: General;  Laterality: Left;  . CHOLECYSTECTOMY  2004  . CYSTOSCOPY N/A 11/23/2014   Procedure: CYSTOSCOPY;  Surgeon: Nunzio Cobbs, MD;  Location: [place] [facility];  Service: Gynecology;  Laterality: N/A;  . LYSIS OF ADHESION N/A 11/23/2014   Procedure: LYSIS OF ADHESION;  Surgeon: Nunzio Cobbs, MD;  Location: [place] [facility];  Service: Gynecology;  Laterality: N/A;  . RE-EXCISION OF BREAST LUMPECTOMY Left 08/05/2017   Procedure: RE-EXCISION OF BREAST CANCER LATERAL MARGINS;  Surgeon: Jovita Kussmaul, MD;  Location: [place];  Service: General;  Laterality: Left;  . SALPINGOOPHORECTOMY Bilateral 11/23/2014   Procedure: BILATERAL SALPINGO OOPHORECTOMY;  Surgeon: Nunzio Cobbs, MD;  Location: [place] [facility];  Service: Gynecology;  Laterality: Bilateral;  . TUBAL LIGATION  Current Outpatient Medications  Medication Sig Dispense Refill  . Calcium Carbonate-Vitamin D (CALCIUM-VITAMIN D) 600-200 MG-UNIT CAPS Take 1 tablet by mouth 2 (two) times daily.    Marland Kitchen gabapentin (NEURONTIN) 300 MG capsule Take 1 capsule (300 mg total) by mouth at bedtime. 90 capsule 3  . lisinopril (ZESTRIL) 20 MG tablet TAKE 1 TABLET BY MOUTH EVERY DAY 90 tablet 3  . rosuvastatin (CRESTOR) 20 MG tablet TAKE 1 TABLET BY MOUTH EVERY DAY 90 tablet 0  . vitamin C (ASCORBIC ACID) 500 MG tablet Take  1,000 mg by mouth daily.     No current facility-administered medications for this visit.    Family History  Problem Relation Age of Onset  . Stroke Father   . Alcohol abuse Father   . Cancer Brother        bladder cancer  . Alcohol abuse Other   . Arthritis Other   . Hypertension Other   . Stroke Other   . Breast cancer Maternal Grandmother 86  . Breast cancer Paternal Grandmother 69    Review of Systems  Constitutional: Negative.   HENT: Negative.   Eyes: Negative.   Respiratory: Negative.   Cardiovascular: Negative.   Gastrointestinal: Negative.   Endocrine: Negative.   Genitourinary: Negative.   Musculoskeletal: Negative.   Skin: Negative.   Allergic/Immunologic: Negative.   Neurological: Negative.   Hematological: Negative.   Psychiatric/Behavioral: Negative.     Exam:   BP 130/68   Pulse 76   Ht 5' 3.75" (1.619 m)   Wt 127 lb (57.6 kg)   LMP 05/14/1996   SpO2 96%   BMI 21.97 kg/m     General appearance: alert, cooperative and appears stated age Head: normocephalic, without obvious abnormality, atraumatic Neck: no adenopathy, supple, symmetrical, trachea midline and thyroid normal to inspection and palpation Lungs: clear to auscultation bilaterally Breasts: right - normal appearance, no masses or tenderness, No nipple retraction or dimpling, No nipple discharge or bleeding, No axillary adenopathy Left - absent nipple.  No masses or tenderness, No axillary adenopathy Heart: regular rate and rhythm Abdomen: soft, non-tender; no masses, no organomegaly Extremities: extremities normal, atraumatic, no cyanosis or edema Skin: skin color, texture, turgor normal. No rashes or lesions Lymph nodes: cervical, supraclavicular, and axillary nodes normal. Neurologic: grossly normal  Pelvic: External genitalia:  no lesions              No abnormal inguinal nodes palpated.              Urethra:  normal appearing urethra with no masses, tenderness or lesions               Bartholins and Skenes: normal                 Vagina: normal appearing vagina with normal color and discharge, no lesions.  Second degree cystocele.   Good apical and posterior vaginal support.  No mesh exposure.              Cervix: absent.               Pap taken: No. Bimanual Exam:  Uterus:  absent              Adnexa: no mass, fullness, tenderness              Rectal exam: Yes.  .  Confirms.              Anus:  normal sphincter tone, no lesions  Chaperone was present for exam.  Assessment:   Well woman visit with abnormal finding of cystocele. Recurrent cystocele.  Status post hysterectomy. Status post abdominal sacrocolpopexy, Halban's culdoplasty, BSO, lysis of adhesions, TVT exact midurethral sling, cystoscopy, anterior colporrhaphy. Hx of left breast DCIS. Excision, re-excision, and XRT. Osteopenia.  Hot flashes at night.  Treated with Gabapentin.  UTIs. Resolved.  Atrophy.  Not symptomatic.  Plan: Mammogram discussed. Self breast awareness reviewed. Guidelines for Calcium, Vitamin D, regular exercise program including cardiovascular and weight bearing exercise. Refill of gabapentin 300 mg q hs.  BMD [facility] fall at [facility].  We discussed heavy lifting and straining as risk factors for progression of her prolapse.  Follow up annually and prn.   30 min  total time was spent for this patient encounter, including preparation, face-to-face counseling with the patient, coordination of care, and documentation of the encounter.

## 2020-07-20 NOTE — Patient Instructions (Signed)

## 2020-08-19 ENCOUNTER — Other Ambulatory Visit: Payer: Self-pay

## 2020-08-19 ENCOUNTER — Encounter: Payer: Self-pay | Admitting: Family Medicine

## 2020-08-19 ENCOUNTER — Ambulatory Visit (INDEPENDENT_AMBULATORY_CARE_PROVIDER_SITE_OTHER): Payer: Medicare Other | Admitting: Family Medicine

## 2020-08-19 VITALS — BP 130/70 | HR 81 | Temp 98.4°F | Wt 129.8 lb

## 2020-08-19 DIAGNOSIS — R21 Rash and other nonspecific skin eruption: Secondary | ICD-10-CM | POA: Diagnosis not present

## 2020-08-19 MED ORDER — ROSUVASTATIN CALCIUM 20 MG PO TABS: 20.0000 mg | ORAL_TABLET | Freq: Every day | ORAL | 1 refills | Status: DC

## 2020-08-19 MED ORDER — TRIAMCINOLONE ACETONIDE 0.1 % EX CREA: 1.0000 "application " | TOPICAL_CREAM | Freq: Two times a day (BID) | CUTANEOUS | 0 refills | Status: DC

## 2020-08-19 NOTE — Progress Notes (Signed)
Established Patient Office Visit  Subjective:  Patient ID: Bridget Strong, female    DOB: 1950-09-26  Age: 70 y.o. MRN: [id number]  CC: No chief complaint on file.   HPI Bridget Strong presents for pruritic area on her right lower anterior leg for about 5 to 6 months.  She has been scratching this occasionally.  She is tried multiple over-the-counter things including Lotrimin, over-the-counter hydrocortisone cream and Neosporin without improvement.  No past history of skin cancer.  No associated bleeding or pain.  This does itch.  She has been keeping this covered with a Band-Aid for the most part  She needs refills of Crestor.  Her lipids were greatly improved after switching over to Crestor.  She is tolerating well with no side effects.  Past Medical History:  Diagnosis Date  . Breast cancer ([place]) 2019   Left Breast Cancer  . Broken humerus 10-24-13  . Cancer [facility])    breast cancer  . Chicken pox   . Complication of anesthesia 1998   Developed bigeminy post op hysterectomy  . Hyperlipidemia   . Hypertension   . Personal history of radiation therapy 2019   Left Breast Cancer    Past Surgical History:  Procedure Laterality Date  . ABDOMINAL HYSTERECTOMY  1998   TVH--ovaries remain  . ABDOMINAL SACROCOLPOPEXY N/A 11/23/2014   Procedure: ABDOMINAL SACROCOLPOPEXY/HALBAN'S CULDOPLASTY;  Surgeon: Nunzio Cobbs, MD;  Location: [place] [facility];  Service: Gynecology;  Laterality: N/A;  . ANTERIOR AND POSTERIOR REPAIR N/A 11/23/2014   Procedure: ANTERIOR (CYSTOCELE) ;  Surgeon: Nunzio Cobbs, MD;  Location: [place] [facility];  Service: Gynecology;  Laterality: N/A;  . BLADDER SUSPENSION N/A 11/23/2014   Procedure: EXACT MIDURETERAL SLING;  Surgeon: Nunzio Cobbs, MD;  Location: [place] [facility];  Service: Gynecology;  Laterality: N/A;  . BREAST LUMPECTOMY Left 2019  . BREAST LUMPECTOMY WITH RADIOACTIVE SEED AND SENTINEL LYMPH NODE BIOPSY Left 07/04/2017   Procedure: RADIOACTIVE  SEED GUIDED LEFT BREAST LUMPECTOMY, LEFT AXILLARY SENTINEL LYMPH NODE BIOPSY;  Surgeon: Jovita Kussmaul, MD;  Location: [place];  Service: General;  Laterality: Left;  . CHOLECYSTECTOMY  2004  . CYSTOSCOPY N/A 11/23/2014   Procedure: CYSTOSCOPY;  Surgeon: Nunzio Cobbs, MD;  Location: [place] [facility];  Service: Gynecology;  Laterality: N/A;  . LYSIS OF ADHESION N/A 11/23/2014   Procedure: LYSIS OF ADHESION;  Surgeon: Nunzio Cobbs, MD;  Location: [place] [facility];  Service: Gynecology;  Laterality: N/A;  . RE-EXCISION OF BREAST LUMPECTOMY Left 08/05/2017   Procedure: RE-EXCISION OF BREAST CANCER LATERAL MARGINS;  Surgeon: Jovita Kussmaul, MD;  Location: [place];  Service: General;  Laterality: Left;  . SALPINGOOPHORECTOMY Bilateral 11/23/2014   Procedure: BILATERAL SALPINGO OOPHORECTOMY;  Surgeon: Nunzio Cobbs, MD;  Location: [place] [facility];  Service: Gynecology;  Laterality: Bilateral;  . TUBAL LIGATION      Family History  Problem Relation Age of Onset  . Stroke Father   . Alcohol abuse Father   . Cancer Brother        bladder cancer  . Alcohol abuse Other   . Arthritis Other   . Hypertension Other   . Stroke Other   . Breast cancer Maternal Grandmother 78  . Breast cancer Paternal Grandmother 12    Social History   Socioeconomic History  . Marital status: Married    Spouse name: Not on file  . Number of children: Not  on file  . Years of education: Not on file  . Highest education level: Not on file  Occupational History  . Not on file  Tobacco Use  . Smoking status: Never Smoker  . Smokeless tobacco: Never Used  Vaping Use  . Vaping Use: Never used  Substance and Sexual Activity  . Alcohol use: No    Alcohol/week: 0.0 standard drinks  . Drug use: No  . Sexual activity: Not Currently    Partners: Male    Birth control/protection: Surgical    Comment: TVH-ovaries remain  Other Topics Concern  . Not on file  Social History Narrative  . Not on file    Social Determinants of Health   Financial Resource Strain: Not on file  Food Insecurity: Not on file  Transportation Needs: Not on file  Physical Activity: Not on file  Stress: Not on file  Social Connections: Not on file  Intimate Partner Violence: Not on file    Outpatient Medications Prior to Visit  Medication Sig Dispense Refill  . Calcium Carbonate-Vitamin D (CALCIUM-VITAMIN D) 600-200 MG-UNIT CAPS Take 1 tablet by mouth 2 (two) times daily.    Marland Kitchen gabapentin (NEURONTIN) 300 MG capsule Take 1 capsule (300 mg total) by mouth at bedtime. 90 capsule 3  . lisinopril (ZESTRIL) 20 MG tablet TAKE 1 TABLET BY MOUTH EVERY DAY 90 tablet 3  . vitamin C (ASCORBIC ACID) 500 MG tablet Take 1,000 mg by mouth daily.    . rosuvastatin (CRESTOR) 20 MG tablet TAKE 1 TABLET BY MOUTH EVERY DAY 90 tablet 0   No facility-administered medications prior to visit.    Allergies  Allergen Reactions  . Diflucan [Fluconazole] Hives, Itching and Swelling  . Hydrocodone Diarrhea and Nausea And Vomiting  . Tramadol Other (See Comments)    Makes her sick and feel weird  . Oxycodone Other (See Comments)    Dizziness    ROS Review of Systems  Constitutional: Negative for appetite change, chills, fever and unexpected weight change.      Objective:    Physical Exam Vitals reviewed.  Constitutional:      Appearance: Normal appearance.  Cardiovascular:     Rate and Rhythm: Normal rate and regular rhythm.  Skin:    Comments: Right anterior leg reveals approximately 1 x 1 cm area of mild erythema.  No nodular changes.  This is completely macular.  No necrosis.  No ulceration.  Neurological:     Mental Status: She is alert.     BP 130/70 (BP Location: Left Arm, Patient Position: Sitting, Cuff Size: Normal)   Pulse 81   Temp 98.4 F (36.9 C) (Oral)   Wt 129 lb 12.8 oz (58.9 kg)   LMP 05/14/1996   SpO2 98%   BMI 22.46 kg/m  Wt Readings from Last 3 Encounters:  08/19/20 129 lb 12.8 oz (58.9  kg)  07/20/20 127 lb (57.6 kg)  02/29/20 129 lb 4.8 oz (58.7 kg)     Health Maintenance Due  Topic Date Due  . COLONOSCOPY (Pts 45-60yrs Insurance coverage will need to be confirmed)  05/14/2016  . COVID-19 Vaccine (4 - Booster for Pfizer series) 08/10/2020    There are no preventive care reminders to display for this patient.  Lab Results  Component Value Date   TSH 2.43 02/27/2019   Lab Results  Component Value Date   WBC 5.0 02/27/2019   HGB 13.4 02/27/2019   HCT 39.3 02/27/2019   MCV 92.0 02/27/2019   PLT 243.0  02/27/2019   Lab Results  Component Value Date   NA 142 02/29/2020   K 4.1 02/29/2020   CO2 27 02/29/2020   GLUCOSE 88 02/29/2020   BUN 19 02/29/2020   CREATININE 0.73 02/29/2020   BILITOT 0.5 05/02/2020   ALKPHOS 81 05/02/2020   AST 14 05/02/2020   ALT 18 05/02/2020   PROT 7.1 05/02/2020   ALBUMIN 4.4 05/02/2020   CALCIUM 9.7 02/29/2020   ANIONGAP 8 06/26/2017   GFR 71.98 02/27/2019   Lab Results  Component Value Date   CHOL 172 05/02/2020   Lab Results  Component Value Date   HDL 56.40 05/02/2020   Lab Results  Component Value Date   [facility]  02/29/2020     Comment:     . LDL cholesterol not calculated. Triglyceride levels greater than 400 mg/dL invalidate calculated LDL results. . Reference range: <100 . Desirable range <100 mg/dL for primary prevention;   <70 mg/dL for patients with CHD or diabetic patients  with > or = 2 CHD risk factors. Marland Kitchen LDL-C is now calculated using the Martin-Hopkins  calculation, which is a validated novel method providing  better accuracy than the Friedewald equation in the  estimation of LDL-C.  Cresenciano Genre et al. Annamaria Helling. 6045;409(81): 2061-2068  (http://education.[employer].com/faq/FAQ164)    Lab Results  Component Value Date   TRIG 213.0 (H) 05/02/2020   Lab Results  Component Value Date   CHOLHDL 3 05/02/2020   No results found for: HGBA1C    Assessment & Plan:   #1 small area of  irritation right anterior leg.  Clinically, does not look like basal cell and no evidence for melanoma.  Hopefully not early squamous cell.  She does not have any significant ulceration or any nodular quality  -Leave off Neosporin and over-the-counter medications -Trial of triamcinolone 0.1% cream twice daily -Be in touch if this is not improving over the next 3 weeks -We will need likely biopsy if not improved over that time  #2 hyperlipidemia improved on Crestor -Refill Crestor for 6 months   Meds ordered this encounter  Medications  . triamcinolone cream (KENALOG) 0.1 %    Sig: Apply 1 application topically 2 (two) times daily.    Dispense:  15 g    Refill:  0  . rosuvastatin (CRESTOR) 20 MG tablet    Sig: Take 1 tablet (20 mg total) by mouth daily.    Dispense:  90 tablet    Refill:  1    Follow-up: No follow-ups on file.    Carolann Littler, MD

## 2020-08-19 NOTE — Patient Instructions (Signed)
Leave off the Neosporin  Try the prescribed cream twice daily  Please let me know if not improved in 3 weeks.

## 2020-08-24 ENCOUNTER — Other Ambulatory Visit: Payer: Self-pay

## 2020-08-24 ENCOUNTER — Ambulatory Visit
Admission: RE | Admit: 2020-08-24 | Discharge: 2020-08-24 | Disposition: A | Discharge status: Home / Self Care | Payer: Medicare Other | Source: Ambulatory Visit | Attending: Obstetrics and Gynecology | Admitting: Obstetrics and Gynecology

## 2020-08-24 DIAGNOSIS — Z9889 Other specified postprocedural states: Secondary | ICD-10-CM

## 2020-08-24 DIAGNOSIS — Z853 Personal history of malignant neoplasm of breast: Secondary | ICD-10-CM | POA: Diagnosis not present

## 2020-08-24 DIAGNOSIS — R922 Inconclusive mammogram: Secondary | ICD-10-CM | POA: Diagnosis not present

## 2020-09-16 ENCOUNTER — Other Ambulatory Visit: Payer: Self-pay

## 2020-09-16 ENCOUNTER — Encounter: Payer: Self-pay | Admitting: Family Medicine

## 2020-09-16 ENCOUNTER — Ambulatory Visit (INDEPENDENT_AMBULATORY_CARE_PROVIDER_SITE_OTHER): Payer: Medicare Other | Admitting: Family Medicine

## 2020-09-16 VITALS — BP 140/72 | HR 87 | Temp 98.1°F | Wt 127.8 lb

## 2020-09-16 DIAGNOSIS — L989 Disorder of the skin and subcutaneous tissue, unspecified: Secondary | ICD-10-CM

## 2020-09-16 NOTE — Progress Notes (Signed)
Established Patient Office Visit  Subjective:  Patient ID: Bridget Strong, female    DOB: 12/23/1950  Age: 70 y.o. MRN: [id number]  CC:  Chief Complaint  Patient presents with  . Follow-up    Spot on leg    HPI Bridget Strong presents for follow-up regarding small nodular area right leg anteriorly.  Refer to previous note for details  Bridget Strong presents for pruritic area on her right lower anterior leg for about 5 to 6 months.  She has been scratching this occasionally.  She is tried multiple over-the-counter things including Lotrimin, over-the-counter hydrocortisone cream and Neosporin without improvement.  No past history of skin cancer.  No associated bleeding or pain.  This does itch.  She has been keeping this covered with a Band-Aid for the most part  She had immediate relief of itching after starting triamcinolone cream.  She does not note any growth whatsoever.  No drainage.  No bleeding.  Past Medical History:  Diagnosis Date  . Breast cancer ([place]) 2019   Left Breast Cancer  . Broken humerus 10-24-13  . Cancer [facility])    breast cancer  . Chicken pox   . Complication of anesthesia 1998   Developed bigeminy post op hysterectomy  . Hyperlipidemia   . Hypertension   . Personal history of radiation therapy 2019   Left Breast Cancer    Past Surgical History:  Procedure Laterality Date  . ABDOMINAL HYSTERECTOMY  1998   TVH--ovaries remain  . ABDOMINAL SACROCOLPOPEXY N/A 11/23/2014   Procedure: ABDOMINAL SACROCOLPOPEXY/HALBAN'S CULDOPLASTY;  Surgeon: Nunzio Cobbs, MD;  Location: [place] [facility];  Service: Gynecology;  Laterality: N/A;  . ANTERIOR AND POSTERIOR REPAIR N/A 11/23/2014   Procedure: ANTERIOR (CYSTOCELE) ;  Surgeon: Nunzio Cobbs, MD;  Location: [place] [facility];  Service: Gynecology;  Laterality: N/A;  . BLADDER SUSPENSION N/A 11/23/2014   Procedure: EXACT MIDURETERAL SLING;  Surgeon: Nunzio Cobbs, MD;  Location: [place] [facility];  Service:  Gynecology;  Laterality: N/A;  . BREAST LUMPECTOMY Left 2019  . BREAST LUMPECTOMY WITH RADIOACTIVE SEED AND SENTINEL LYMPH NODE BIOPSY Left 07/04/2017   Procedure: RADIOACTIVE SEED GUIDED LEFT BREAST LUMPECTOMY, LEFT AXILLARY SENTINEL LYMPH NODE BIOPSY;  Surgeon: Jovita Kussmaul, MD;  Location: [place];  Service: General;  Laterality: Left;  . CHOLECYSTECTOMY  2004  . CYSTOSCOPY N/A 11/23/2014   Procedure: CYSTOSCOPY;  Surgeon: Nunzio Cobbs, MD;  Location: [place] [facility];  Service: Gynecology;  Laterality: N/A;  . LYSIS OF ADHESION N/A 11/23/2014   Procedure: LYSIS OF ADHESION;  Surgeon: Nunzio Cobbs, MD;  Location: [place] [facility];  Service: Gynecology;  Laterality: N/A;  . RE-EXCISION OF BREAST LUMPECTOMY Left 08/05/2017   Procedure: RE-EXCISION OF BREAST CANCER LATERAL MARGINS;  Surgeon: Jovita Kussmaul, MD;  Location: [place];  Service: General;  Laterality: Left;  . SALPINGOOPHORECTOMY Bilateral 11/23/2014   Procedure: BILATERAL SALPINGO OOPHORECTOMY;  Surgeon: Nunzio Cobbs, MD;  Location: [place] [facility];  Service: Gynecology;  Laterality: Bilateral;  . TUBAL LIGATION      Family History  Problem Relation Age of Onset  . Stroke Father   . Alcohol abuse Father   . Cancer Brother        bladder cancer  . Alcohol abuse Other   . Arthritis Other   . Hypertension Other   . Stroke Other   . Breast cancer Maternal Grandmother 20  . Breast  cancer Paternal Grandmother 61    Social History   Socioeconomic History  . Marital status: Married    Spouse name: Not on file  . Number of children: Not on file  . Years of education: Not on file  . Highest education level: Not on file  Occupational History  . Not on file  Tobacco Use  . Smoking status: Never Smoker  . Smokeless tobacco: Never Used  Vaping Use  . Vaping Use: Never used  Substance and Sexual Activity  . Alcohol use: No    Alcohol/week: 0.0 standard drinks  . Drug use: No  . Sexual activity: Not  Currently    Partners: Male    Birth control/protection: Surgical    Comment: TVH-ovaries remain  Other Topics Concern  . Not on file  Social History Narrative  . Not on file   Social Determinants of Health   Financial Resource Strain: Not on file  Food Insecurity: Not on file  Transportation Needs: Not on file  Physical Activity: Not on file  Stress: Not on file  Social Connections: Not on file  Intimate Partner Violence: Not on file    Outpatient Medications Prior to Visit  Medication Sig Dispense Refill  . Calcium Carbonate-Vitamin D (CALCIUM-VITAMIN D) 600-200 MG-UNIT CAPS Take 1 tablet by mouth 2 (two) times daily.    Marland Kitchen gabapentin (NEURONTIN) 300 MG capsule Take 1 capsule (300 mg total) by mouth at bedtime. 90 capsule 3  . lisinopril (ZESTRIL) 20 MG tablet TAKE 1 TABLET BY MOUTH EVERY DAY 90 tablet 3  . rosuvastatin (CRESTOR) 20 MG tablet Take 1 tablet (20 mg total) by mouth daily. 90 tablet 1  . triamcinolone cream (KENALOG) 0.1 % Apply 1 application topically 2 (two) times daily. 15 g 0  . vitamin C (ASCORBIC ACID) 500 MG tablet Take 1,000 mg by mouth daily.     No facility-administered medications prior to visit.    Allergies  Allergen Reactions  . Diflucan [Fluconazole] Hives, Itching and Swelling  . Hydrocodone Diarrhea and Nausea And Vomiting  . Tramadol Other (See Comments)    Makes her sick and feel weird  . Oxycodone Other (See Comments)    Dizziness    ROS Review of Systems  Constitutional: Negative for appetite change and unexpected weight change.  Hematological: Negative for adenopathy.      Objective:    Physical Exam Cardiovascular:     Rate and Rhythm: Normal rate and regular rhythm.  Skin:    Comments: Right anterior leg reveals approximately 4 x 4 mm area of slightly erythematous.  No ulceration or nodular changes.  No telangiectasias.  No pigmentary changes.     BP 140/72 (BP Location: Left Arm, Patient Position: Sitting, Cuff Size:  Normal)   Pulse 87   Temp 98.1 F (36.7 C) (Oral)   Wt 127 lb 12.8 oz (58 kg)   LMP 05/14/1996   SpO2 96%   BMI 22.11 kg/m  Wt Readings from Last 3 Encounters:  09/16/20 127 lb 12.8 oz (58 kg)  08/19/20 129 lb 12.8 oz (58.9 kg)  07/20/20 127 lb (57.6 kg)     Health Maintenance Due  Topic Date Due  . COLONOSCOPY (Pts 45-61yrs Insurance coverage will need to be confirmed)  05/14/2016  . COVID-19 Vaccine (4 - Booster for Pfizer series) 08/10/2020    There are no preventive care reminders to display for this patient.  Lab Results  Component Value Date   TSH 2.43 02/27/2019   Lab  Results  Component Value Date   WBC 5.0 02/27/2019   HGB 13.4 02/27/2019   HCT 39.3 02/27/2019   MCV 92.0 02/27/2019   PLT 243.0 02/27/2019   Lab Results  Component Value Date   NA 142 02/29/2020   K 4.1 02/29/2020   CO2 27 02/29/2020   GLUCOSE 88 02/29/2020   BUN 19 02/29/2020   CREATININE 0.73 02/29/2020   BILITOT 0.5 05/02/2020   ALKPHOS 81 05/02/2020   AST 14 05/02/2020   ALT 18 05/02/2020   PROT 7.1 05/02/2020   ALBUMIN 4.4 05/02/2020   CALCIUM 9.7 02/29/2020   ANIONGAP 8 06/26/2017   GFR 71.98 02/27/2019   Lab Results  Component Value Date   CHOL 172 05/02/2020   Lab Results  Component Value Date   HDL 56.40 05/02/2020   Lab Results  Component Value Date   [facility]  02/29/2020     Comment:     . LDL cholesterol not calculated. Triglyceride levels greater than 400 mg/dL invalidate calculated LDL results. . Reference range: <100 . Desirable range <100 mg/dL for primary prevention;   <70 mg/dL for patients with CHD or diabetic patients  with > or = 2 CHD risk factors. Marland Kitchen LDL-C is now calculated using the Martin-Hopkins  calculation, which is a validated novel method providing  better accuracy than the Friedewald equation in the  estimation of LDL-C.  Cresenciano Genre et al. Annamaria Helling. 8675;449(20): 2061-2068  (http://education.[facility].com/faq/FAQ164)    Lab Results   Component Value Date   TRIG 213.0 (H) 05/02/2020   Lab Results  Component Value Date   CHOLHDL 3 05/02/2020   No results found for: HGBA1C    Assessment & Plan:   Right leg anterior skin abnormality .  This does not look typical for basal cell, squamous cell, or melanoma.  This has improved with triamcinolone cream in terms of itching.  No orders of the defined types were placed in this encounter.   Follow-up: No follow-ups on file.    Carolann Littler, MD

## 2020-09-16 NOTE — Patient Instructions (Signed)
Leave off the triamcinolone unless there is recurrent itching.   Let me know if this is growing or changing in any way.

## 2021-01-31 ENCOUNTER — Other Ambulatory Visit: Payer: Self-pay | Admitting: Family Medicine

## 2021-02-02 ENCOUNTER — Other Ambulatory Visit: Payer: Self-pay | Admitting: Family Medicine

## 2021-03-01 ENCOUNTER — Ambulatory Visit (INDEPENDENT_AMBULATORY_CARE_PROVIDER_SITE_OTHER): Payer: Medicare Other | Admitting: Family Medicine

## 2021-03-01 ENCOUNTER — Encounter: Payer: Self-pay | Admitting: Family Medicine

## 2021-03-01 ENCOUNTER — Other Ambulatory Visit: Payer: Self-pay

## 2021-03-01 VITALS — BP 130/62 | HR 76 | Temp 97.8°F | Ht 60.38 in | Wt 127.1 lb

## 2021-03-01 DIAGNOSIS — E785 Hyperlipidemia, unspecified: Secondary | ICD-10-CM

## 2021-03-01 DIAGNOSIS — I10 Essential (primary) hypertension: Secondary | ICD-10-CM

## 2021-03-01 DIAGNOSIS — Z0000 Encounter for general adult medical examination without abnormal findings: Secondary | ICD-10-CM

## 2021-03-01 DIAGNOSIS — Z23 Encounter for immunization: Secondary | ICD-10-CM | POA: Diagnosis not present

## 2021-03-01 LAB — LIPID PANEL
Cholesterol: 171 mg/dL (ref 0–200)
HDL: 55.9 mg/dL (ref >39.00)
NonHDL: 114.6
Total CHOL/HDL Ratio: 3
Triglycerides: 204 mg/dL — ABNORMAL HIGH (ref 0.0–149.0)
VLDL: 40.8 mg/dL — ABNORMAL HIGH (ref 0.0–40.0)

## 2021-03-01 LAB — CBC WITH DIFFERENTIAL/PLATELET
Basophils Absolute: 0 10*3/uL (ref 0.0–0.1)
Basophils Relative: 0.5 % (ref 0.0–3.0)
Eosinophils Absolute: 0.1 10*3/uL (ref 0.0–0.7)
Eosinophils Relative: 1.2 % (ref 0.0–5.0)
HCT: 38.2 % (ref 36.0–46.0)
Hemoglobin: 12.5 g/dL (ref 12.0–15.0)
Lymphocytes Relative: 36.9 % (ref 12.0–46.0)
Lymphs Abs: 1.7 10*3/uL (ref 0.7–4.0)
MCHC: 32.8 g/dL (ref 30.0–36.0)
MCV: 92.3 fl (ref 78.0–100.0)
Monocytes Absolute: 0.3 10*3/uL (ref 0.1–1.0)
Monocytes Relative: 7.3 % (ref 3.0–12.0)
Neutro Abs: 2.5 10*3/uL (ref 1.4–7.7)
Neutrophils Relative %: 54.1 % (ref 43.0–77.0)
Platelets: 213 10*3/uL (ref 150.0–400.0)
RBC: 4.14 Mil/uL (ref 3.87–5.11)
RDW: 13.3 % (ref 11.5–15.5)
WBC: 4.7 10*3/uL (ref 4.0–10.5)

## 2021-03-01 LAB — BASIC METABOLIC PANEL
BUN: 22 mg/dL (ref 6–23)
CO2: 29 mEq/L (ref 19–32)
Calcium: 9.5 mg/dL (ref 8.4–10.5)
Chloride: 105 mEq/L (ref 96–112)
Creatinine, Ser: 0.82 mg/dL (ref 0.40–1.20)
GFR: 71.74 mL/min (ref >60.00)
Glucose, Bld: 94 mg/dL (ref 70–99)
Potassium: 4.1 mEq/L (ref 3.5–5.1)
Sodium: 141 mEq/L (ref 135–145)

## 2021-03-01 LAB — HEPATIC FUNCTION PANEL
ALT: 16 U/L (ref 0–35)
AST: 16 U/L (ref 0–37)
Albumin: 4.4 g/dL (ref 3.5–5.2)
Alkaline Phosphatase: 69 U/L (ref 39–117)
Bilirubin, Direct: 0.1 mg/dL (ref 0.0–0.3)
Total Bilirubin: 0.5 mg/dL (ref 0.2–1.2)
Total Protein: 7.2 g/dL (ref 6.0–8.3)

## 2021-03-01 LAB — LDL CHOLESTEROL, DIRECT: Direct LDL: 95 mg/dL

## 2021-03-01 LAB — TSH: TSH: 5.17 u[IU]/mL (ref 0.35–5.50)

## 2021-03-01 MED ORDER — LISINOPRIL 20 MG PO TABS: 20.0000 mg | ORAL_TABLET | Freq: Every day | ORAL | 3 refills | Status: DC

## 2021-03-01 MED ORDER — ROSUVASTATIN CALCIUM 20 MG PO TABS: 20.0000 mg | ORAL_TABLET | Freq: Every day | ORAL | 3 refills | Status: DC

## 2021-03-01 NOTE — Addendum Note (Signed)
Addended by: Amanda Cockayne on: 03/01/2021 09:40 AM   Modules accepted: Orders

## 2021-03-01 NOTE — Progress Notes (Signed)
Established Patient Office Visit  Subjective:  Patient ID: Bridget Strong, female    DOB: 1950/05/25  Age: 70 y.o. MRN: [id number]  CC:  Chief Complaint  Patient presents with   Annual Exam    HPI Bridget Strong presents for physical exam.  She has history of hypertension, hyperlipidemia, osteopenia, breast cancer.  She is still followed by GYN.  She takes gabapentin at night for hot flashes and has been getting this through her GYN in the past but may like to transition her prescription here soon.  She is on rosuvastatin for hyperlipidemia and lisinopril for hypertension.  Blood pressure well controlled.  Very health-conscious.  She and her husband go to the gym few times per week.  -Needs flu vaccine -Other vaccines up-to-date-except tetanus.  See below. -She had negative Cologuard a year ago. -Does need tetanus and she is considering getting this at the pharmacy.  Social history-married for 54 years.  She has 3 sons and 7 grandchildren.  Non-smoker.  No regular alcohol.  Family history-Father was alcoholic and has a history of hypertension and stroke.  Her mother died age 31 of "old age ".  She had a brother that died age 68.  He had bladder cancer and some type of mental disability possibly schizophrenia.  Past Medical History:  Diagnosis Date   Breast cancer ([place]) 2019   Left Breast Cancer   Broken humerus 10-24-13   Cancer [facility])    breast cancer   Chicken pox    Complication of anesthesia 1998   Developed bigeminy post op hysterectomy   Hyperlipidemia    Hypertension    Personal history of radiation therapy 2019   Left Breast Cancer    Past Surgical History:  Procedure Laterality Date   ABDOMINAL HYSTERECTOMY  1998   TVH--ovaries remain   ABDOMINAL SACROCOLPOPEXY N/A 11/23/2014   Procedure: ABDOMINAL SACROCOLPOPEXY/HALBAN'S CULDOPLASTY;  Surgeon: Nunzio Cobbs, MD;  Location: [facility];  Service: Gynecology;  Laterality: N/A;   ANTERIOR AND POSTERIOR  REPAIR N/A 11/23/2014   Procedure: ANTERIOR (CYSTOCELE) ;  Surgeon: Nunzio Cobbs, MD;  Location: [facility];  Service: Gynecology;  Laterality: N/A;   BLADDER SUSPENSION N/A 11/23/2014   Procedure: EXACT MIDURETERAL SLING;  Surgeon: Nunzio Cobbs, MD;  Location: [facility];  Service: Gynecology;  Laterality: N/A;   BREAST LUMPECTOMY Left 2019   BREAST LUMPECTOMY WITH RADIOACTIVE SEED AND SENTINEL LYMPH NODE BIOPSY Left 07/04/2017   Procedure: RADIOACTIVE SEED GUIDED LEFT BREAST LUMPECTOMY, LEFT AXILLARY SENTINEL LYMPH NODE BIOPSY;  Surgeon: Jovita Kussmaul, MD;  Location: [place];  Service: General;  Laterality: Left;   CHOLECYSTECTOMY  2004   CYSTOSCOPY N/A 11/23/2014   Procedure: CYSTOSCOPY;  Surgeon: Nunzio Cobbs, MD;  Location: [facility];  Service: Gynecology;  Laterality: N/A;   LYSIS OF ADHESION N/A 11/23/2014   Procedure: LYSIS OF ADHESION;  Surgeon: Nunzio Cobbs, MD;  Location: [facility];  Service: Gynecology;  Laterality: N/A;   RE-EXCISION OF BREAST LUMPECTOMY Left 08/05/2017   Procedure: RE-EXCISION OF BREAST CANCER LATERAL MARGINS;  Surgeon: Jovita Kussmaul, MD;  Location: [place];  Service: General;  Laterality: Left;   SALPINGOOPHORECTOMY Bilateral 11/23/2014   Procedure: BILATERAL SALPINGO OOPHORECTOMY;  Surgeon: Nunzio Cobbs, MD;  Location: [facility];  Service: Gynecology;  Laterality: Bilateral;   TUBAL LIGATION      Family History  Problem Relation Age of  Onset   Hypertension Father    Stroke Father    Alcohol abuse Father    Cancer Brother        bladder cancer   Early death Paternal Aunt    Breast cancer Maternal Grandmother 52   Breast cancer Paternal Grandmother 44   Alcohol abuse Other    Arthritis Other    Hypertension Other    Stroke Other     Social History   Socioeconomic History   Marital status: Married    Spouse name: Not on file   Number of children: Not on file   Years of education: Not on file    Highest education level: Not on file  Occupational History   Not on file  Tobacco Use   Smoking status: Never   Smokeless tobacco: Never  Vaping Use   Vaping Use: Never used  Substance and Sexual Activity   Alcohol use: No    Alcohol/week: 0.0 standard drinks   Drug use: No   Sexual activity: Not Currently    Partners: Male    Birth control/protection: Surgical    Comment: TVH-ovaries remain  Other Topics Concern   Not on file  Social History Narrative   Not on file   Social Determinants of Health   Financial Resource Strain: Not on file  Food Insecurity: Not on file  Transportation Needs: Not on file  Physical Activity: Not on file  Stress: Not on file  Social Connections: Not on file  Intimate Partner Violence: Not on file    Outpatient Medications Prior to Visit  Medication Sig Dispense Refill   Calcium Carbonate-Vitamin D (CALCIUM-VITAMIN D) 600-200 MG-UNIT CAPS Take 1 tablet by mouth 2 (two) times daily.     gabapentin (NEURONTIN) 300 MG capsule Take 1 capsule (300 mg total) by mouth at bedtime. 90 capsule 3   triamcinolone cream (KENALOG) 0.1 % Apply 1 application topically 2 (two) times daily. 15 g 0   vitamin C (ASCORBIC ACID) 500 MG tablet Take 1,000 mg by mouth daily.     lisinopril (ZESTRIL) 20 MG tablet TAKE 1 TABLET BY MOUTH EVERY DAY 90 tablet 0   rosuvastatin (CRESTOR) 20 MG tablet TAKE 1 TABLET BY MOUTH EVERY DAY 90 tablet 1   No facility-administered medications prior to visit.    Allergies  Allergen Reactions   Diflucan [Fluconazole] Hives, Itching and Swelling   Hydrocodone Diarrhea and Nausea And Vomiting   Tramadol Other (See Comments)    Makes her sick and feel weird   Oxycodone Other (See Comments)    Dizziness    ROS Review of Systems  Constitutional:  Negative for activity change, appetite change, fatigue, fever and unexpected weight change.  HENT:  Negative for ear pain, hearing loss, sore throat and trouble swallowing.   Eyes:   Negative for visual disturbance.  Respiratory:  Negative for cough and shortness of breath.   Cardiovascular:  Negative for chest pain and palpitations.  Gastrointestinal:  Negative for abdominal pain, blood in stool, constipation and diarrhea.  Endocrine: Negative for polydipsia and polyuria.  Genitourinary:  Negative for dysuria and hematuria.  Musculoskeletal:  Negative for arthralgias, back pain and myalgias.  Skin:  Negative for rash.  Neurological:  Negative for dizziness, syncope and headaches.  Hematological:  Negative for adenopathy.  Psychiatric/Behavioral:  Negative for confusion and dysphoric mood.      Objective:    Physical Exam Constitutional:      Appearance: She is well-developed.  HENT:  Head: Normocephalic and atraumatic.  Eyes:     Pupils: Pupils are equal, round, and reactive to light.  Neck:     Thyroid: No thyromegaly.  Cardiovascular:     Rate and Rhythm: Normal rate and regular rhythm.     Heart sounds: Normal heart sounds. No murmur heard. Pulmonary:     Effort: No respiratory distress.     Breath sounds: Normal breath sounds. No wheezing or rales.  Abdominal:     General: Bowel sounds are normal. There is no distension.     Palpations: Abdomen is soft. There is no mass.     Tenderness: There is no abdominal tenderness. There is no guarding or rebound.  Musculoskeletal:        General: Normal range of motion.     Cervical back: Normal range of motion and neck supple.  Lymphadenopathy:     Cervical: No cervical adenopathy.  Skin:    Findings: No rash.  Neurological:     Mental Status: She is alert and oriented to person, place, and time.     Cranial Nerves: No cranial nerve deficit.  Psychiatric:        Behavior: Behavior normal.        Thought Content: Thought content normal.        Judgment: Judgment normal.    BP 130/62 (BP Location: Left Arm, Patient Position: Sitting, Cuff Size: Normal)   Pulse 76   Temp 97.8 F (36.6 C) (Oral)    Ht 5' 0.38" (1.534 m)   Wt 127 lb 1.6 oz (57.7 kg)   LMP 05/14/1996   SpO2 95%   BMI 24.52 kg/m  Wt Readings from Last 3 Encounters:  03/01/21 127 lb 1.6 oz (57.7 kg)  09/16/20 127 lb 12.8 oz (58 kg)  08/19/20 129 lb 12.8 oz (58.9 kg)     Health Maintenance Due  Topic Date Due   COLONOSCOPY (Pts 45-38yrs Insurance coverage will need to be confirmed)  05/14/2016   COVID-19 Vaccine (5 - Booster for Pfizer series) 02/22/2021    There are no preventive care reminders to display for this patient.  Lab Results  Component Value Date   TSH 2.43 02/27/2019   Lab Results  Component Value Date   WBC 5.0 02/27/2019   HGB 13.4 02/27/2019   HCT 39.3 02/27/2019   MCV 92.0 02/27/2019   PLT 243.0 02/27/2019   Lab Results  Component Value Date   NA 142 02/29/2020   K 4.1 02/29/2020   CO2 27 02/29/2020   GLUCOSE 88 02/29/2020   BUN 19 02/29/2020   CREATININE 0.73 02/29/2020   BILITOT 0.5 05/02/2020   ALKPHOS 81 05/02/2020   AST 14 05/02/2020   ALT 18 05/02/2020   PROT 7.1 05/02/2020   ALBUMIN 4.4 05/02/2020   CALCIUM 9.7 02/29/2020   ANIONGAP 8 06/26/2017   GFR 71.98 02/27/2019   Lab Results  Component Value Date   CHOL 172 05/02/2020   Lab Results  Component Value Date   HDL 56.40 05/02/2020   Lab Results  Component Value Date   [facility]  02/29/2020     Comment:     . LDL cholesterol not calculated. Triglyceride levels greater than 400 mg/dL invalidate calculated LDL results. . Reference range: <100 . Desirable range <100 mg/dL for primary prevention;   <70 mg/dL for patients with CHD or diabetic patients  with > or = 2 CHD risk factors. Marland Kitchen LDL-C is now calculated using the Martin-Hopkins  calculation, which is a  validated novel method providing  better accuracy than the Friedewald equation in the  estimation of LDL-C.  Cresenciano Genre et al. Annamaria Helling. 9622;297(98): 2061-2068  (http://education.[employer].com/faq/FAQ164)    Lab Results  Component Value Date    TRIG 213.0 (H) 05/02/2020   Lab Results  Component Value Date   CHOLHDL 3 05/02/2020   No results found for: HGBA1C    Assessment & Plan:   Physical exam.  She has hypertension which is well controlled and hyperlipidemia treated with Crestor.  We discussed the following health maintenance issues  -Flu vaccine given -Cologuard last year negative -Obtain follow-up labs -She will consider tetanus at pharmacy. -Continue regular weightbearing exercise. -Consider possible repeat DEXA scan in 1 to 2 years -Refilled her lisinopril and rosuvastatin for 1 year   Meds ordered this encounter  Medications   rosuvastatin (CRESTOR) 20 MG tablet    Sig: Take 1 tablet (20 mg total) by mouth daily.    Dispense:  90 tablet    Refill:  3   lisinopril (ZESTRIL) 20 MG tablet    Sig: Take 1 tablet (20 mg total) by mouth daily.    Dispense:  90 tablet    Refill:  3    Follow-up: No follow-ups on file.    Carolann Littler, MD

## 2021-04-24 ENCOUNTER — Other Ambulatory Visit: Payer: Self-pay | Admitting: Obstetrics and Gynecology

## 2021-04-24 NOTE — Telephone Encounter (Signed)
Last AEX done 07/20/20. Next scheduled for 07/26/21. Mammo due 08/2021.

## 2021-05-23 ENCOUNTER — Other Ambulatory Visit: Payer: Self-pay | Admitting: Obstetrics and Gynecology

## 2021-05-23 DIAGNOSIS — Z1231 Encounter for screening mammogram for malignant neoplasm of breast: Secondary | ICD-10-CM

## 2021-06-03 LAB — HM HEPATITIS C SCREENING LAB: HM Hepatitis Screen: NEGATIVE

## 2021-07-11 ENCOUNTER — Encounter: Payer: Self-pay | Admitting: Family Medicine

## 2021-07-25 NOTE — Progress Notes (Signed)
71 y.o. G12P3013 Married Caucasian female here for breast and pelvic exam.   ? ?Patient here for gabapentin refill. ?She uses the gabapentin to treat menopausal symptoms and wants to continue.  ? ?Patient is followed for her menopausal symptoms treated with Gabapentin, left breast DCIS, vaginal atrophy, and recurrent cystocele following her prolapse repair.  ? ?No problems leaking urine.  ?Takes a little longer to void, but doing ok.  ?No bowel function concerns.  ?No vaginal bleeding.  ?No vaginal dryness.  ? ?Patient expresses appreciation for the prolapse repair she had done.  ? ?PCP:   Carolann Littler, MD ? ?Patient's last menstrual period was 05/14/1996.     ?  ?    ?Sexually active: No.  ?The current method of family planning is status post Tubal/hysterectomy.    ?Exercising: Yes.     Gym 2x/week, walking ?Smoker:  no ? ?Health Maintenance: ?Pap:  06-14-17 Neg, 04-23-12 Neg ?History of abnormal Pap:  no ?MMG:  08-24-20 Diag Bil/Neg/BiRads2 ?Colonoscopy:  02/2021 cologuard Neg ?BMD:  03-02-19  Result :Osteopenia with PCP, [place].  ?TDaP:  PCP ?Gardasil:   n/a ?HIV:no ?Hep C: 02-09-16 Neg ?Screening Labs:  PCP ? ? reports that she has never smoked. She has never used smokeless tobacco. She reports that she does not drink alcohol and does not use drugs. ? ?Past Medical History:  ?Diagnosis Date  ? Breast cancer ([place]) 2019  ? Left Breast Cancer  ? Broken humerus 10-24-13  ? Cancer [facility])   ? breast cancer  ? Chicken pox   ? Complication of anesthesia 1998  ? Developed bigeminy post op hysterectomy  ? Hyperlipidemia   ? Hypertension   ? Personal history of radiation therapy 2019  ? Left Breast Cancer  ? ? ?Past Surgical History:  ?Procedure Laterality Date  ? ABDOMINAL HYSTERECTOMY  1998  ? TVH--ovaries remain  ? ABDOMINAL SACROCOLPOPEXY N/A 11/23/2014  ? Procedure: ABDOMINAL SACROCOLPOPEXY/HALBAN'S CULDOPLASTY;  Surgeon: Nunzio Cobbs, MD;  Location: [facility];  Service: Gynecology;  Laterality: N/A;  ? ANTERIOR  AND POSTERIOR REPAIR N/A 11/23/2014  ? Procedure: ANTERIOR (CYSTOCELE) ;  Surgeon: Nunzio Cobbs, MD;  Location: [facility];  Service: Gynecology;  Laterality: N/A;  ? BLADDER SUSPENSION N/A 11/23/2014  ? Procedure: EXACT MIDURETERAL SLING;  Surgeon: Nunzio Cobbs, MD;  Location: [facility];  Service: Gynecology;  Laterality: N/A;  ? BREAST LUMPECTOMY Left 2019  ? BREAST LUMPECTOMY WITH RADIOACTIVE SEED AND SENTINEL LYMPH NODE BIOPSY Left 07/04/2017  ? Procedure: RADIOACTIVE SEED GUIDED LEFT BREAST LUMPECTOMY, LEFT AXILLARY SENTINEL LYMPH NODE BIOPSY;  Surgeon: Jovita Kussmaul, MD;  Location: [place];  Service: General;  Laterality: Left;  ? CHOLECYSTECTOMY  2004  ? CYSTOSCOPY N/A 11/23/2014  ? Procedure: CYSTOSCOPY;  Surgeon: Nunzio Cobbs, MD;  Location: [facility];  Service: Gynecology;  Laterality: N/A;  ? LYSIS OF ADHESION N/A 11/23/2014  ? Procedure: LYSIS OF ADHESION;  Surgeon: Nunzio Cobbs, MD;  Location: [facility];  Service: Gynecology;  Laterality: N/A;  ? RE-EXCISION OF BREAST LUMPECTOMY Left 08/05/2017  ? Procedure: RE-EXCISION OF BREAST CANCER LATERAL MARGINS;  Surgeon: Jovita Kussmaul, MD;  Location: [facility];  Service: General;  Laterality: Left;  ? SALPINGOOPHORECTOMY Bilateral 11/23/2014  ? Procedure: BILATERAL SALPINGO OOPHORECTOMY;  Surgeon: Nunzio Cobbs, MD;  Location: [facility];  Service: Gynecology;  Laterality: Bilateral;  ? TUBAL LIGATION    ? ? ?  Current Outpatient Medications  ?Medication Sig Dispense Refill  ? Calcium Carbonate-Vitamin D (CALCIUM-VITAMIN D) 600-200 MG-UNIT CAPS Take 1 tablet by mouth 2 (two) times daily.    ? gabapentin (NEURONTIN) 300 MG capsule TAKE 1 CAPSULE BY MOUTH EVERYDAY AT BEDTIME 90 capsule 0  ? lisinopril (ZESTRIL) 20 MG tablet Take 1 tablet (20 mg total) by mouth daily. 90 tablet 3  ? rosuvastatin (CRESTOR) 20 MG tablet Take 1 tablet (20 mg total) by mouth daily. 90 tablet 3  ? vitamin C (ASCORBIC ACID) 500 MG tablet  Take 1,000 mg by mouth daily.    ? ?No current facility-administered medications for this visit.  ? ? ?Family History  ?Problem Relation Age of Onset  ? Hypertension Father   ? Stroke Father   ? Alcohol abuse Father   ? Cancer Brother   ?     bladder cancer  ? Early death Paternal Aunt   ? Breast cancer Maternal Grandmother 53  ? Breast cancer Paternal Grandmother 62  ? Alcohol abuse Other   ? Arthritis Other   ? Hypertension Other   ? Stroke Other   ? ? ?Review of Systems  ?All other systems reviewed and are negative. ? ?Exam:   ?BP 136/74   Pulse 73   Ht '5\' 4"'$  (1.626 m)   Wt 124 lb (56.2 kg)   LMP 05/14/1996   SpO2 98%   BMI 21.28 kg/m?     ?General appearance: alert, cooperative and appears stated age ?Head: normocephalic, without obvious abnormality, atraumatic ?Neck: no adenopathy, supple, symmetrical, trachea midline and thyroid normal to inspection and palpation ?Lungs: clear to auscultation bilaterally ?Breasts: normal appearance, no masses or tenderness, No nipple retraction or dimpling, No nipple discharge or bleeding, No axillary adenopathy ?Heart: regular rate and rhythm ?Abdomen: soft, non-tender; no masses, no organomegaly ?Extremities: extremities normal, atraumatic, no cyanosis or edema ?Skin: skin color, texture, turgor normal. No rashes or lesions ?Lymph nodes: cervical, supraclavicular, and axillary nodes normal. ?Neurologic: grossly normal ? ?Pelvic: External genitalia:  no lesions ?             No abnormal inguinal nodes palpated. ?             Urethra:  normal appearing urethra with no masses, tenderness or lesions ?             Bartholins and Skenes: normal    ?             Vagina: normal appearing vagina with normal color and discharge, no lesions.  Second degree cystocele.  Atrophy noted.  ?             Cervix: absent ?             Pap taken: no ?Bimanual Exam:  Uterus:  absent ?             Adnexa: no mass, fullness, tenderness ?             Rectal exam: yes.  Confirms. ?              Anus:  normal sphincter tone, no lesions ? ?Chaperone was present for exam:  Estill Bamberg, CMA. ? ?Assessment:   ?Well woman visit with gynecologic exam. ?Recurrent cystocele.  ?Status post hysterectomy. ?Status post abdominal sacrocolpopexy, Halban's culdoplasty, BSO, lysis of adhesions, TVT exact midurethral sling, cystoscopy, anterior colporrhaphy. ?Hx of left breast DCIS.  Excision, re-excision, and XRT.  2019.  ?GYN exam for high risk Medicare patient.  ?  Osteopenia.  ?Hot flashes at night.  Treated with Gabapentin.  ?Encounter for medication monitoring.  ?Atrophy.  Not symptomatic. ? ?Plan: ?Mammogram screening discussed. ?Self breast awareness reviewed. ?Pap and HR HPV as above. ?Guidelines for Calcium, Vitamin D, regular exercise program including cardiovascular and weight bearing exercise. ?BMD with PCP.  ?Refill of Gabapentin 300 mg q hs.  #90, RF 3.  We discussed sedating effect and interaction with other sedating medications.  ?Follow up annually and prn.  ? ?After visit summary provided.  ? ?25 min  total time was spent for this patient encounter, including preparation, face-to-face counseling with the patient, coordination of care, and documentation of the encounter. ? ? ? ? ?

## 2021-07-26 ENCOUNTER — Encounter: Payer: Self-pay | Admitting: Obstetrics and Gynecology

## 2021-07-26 ENCOUNTER — Other Ambulatory Visit: Payer: Self-pay

## 2021-07-26 ENCOUNTER — Ambulatory Visit (INDEPENDENT_AMBULATORY_CARE_PROVIDER_SITE_OTHER): Payer: Medicare Other | Admitting: Obstetrics and Gynecology

## 2021-07-26 VITALS — BP 136/74 | HR 73 | Ht 64.0 in | Wt 124.0 lb

## 2021-07-26 DIAGNOSIS — Z853 Personal history of malignant neoplasm of breast: Secondary | ICD-10-CM | POA: Diagnosis not present

## 2021-07-26 DIAGNOSIS — Z9289 Personal history of other medical treatment: Secondary | ICD-10-CM | POA: Diagnosis not present

## 2021-07-26 DIAGNOSIS — Z5181 Encounter for therapeutic drug level monitoring: Secondary | ICD-10-CM

## 2021-07-26 DIAGNOSIS — Z9189 Other specified personal risk factors, not elsewhere classified: Secondary | ICD-10-CM

## 2021-07-26 DIAGNOSIS — Z01419 Encounter for gynecological examination (general) (routine) without abnormal findings: Secondary | ICD-10-CM

## 2021-07-26 MED ORDER — GABAPENTIN 300 MG PO CAPS: ORAL_CAPSULE | ORAL | 3 refills | Status: DC

## 2021-07-26 NOTE — Patient Instructions (Signed)

## 2021-08-25 ENCOUNTER — Ambulatory Visit
Admission: RE | Admit: 2021-08-25 | Discharge: 2021-08-25 | Disposition: A | Discharge status: Home / Self Care | Payer: Medicare Other | Source: Ambulatory Visit | Attending: Obstetrics and Gynecology | Admitting: Obstetrics and Gynecology

## 2021-08-25 DIAGNOSIS — Z1231 Encounter for screening mammogram for malignant neoplasm of breast: Secondary | ICD-10-CM | POA: Diagnosis not present

## 2022-01-02 ENCOUNTER — Other Ambulatory Visit: Payer: Self-pay

## 2022-01-02 NOTE — Patient Outreach (Signed)
  Care Coordination   Initial Visit Note   01/02/2022 Name: ALICEN DONALSON MRN: [id number] DOB: 1950/11/20  MIDORI DADO is a 71 y.o. year old female who sees Burchette, [name], MD for primary care. I spoke with  [name] by phone today  What matters to the patients health and wellness today?  I am doing great    Goals Addressed             This Visit's Progress    COMPLETED: Care Coordination Activities - no follow up required       Care Coordination Interventions: Provided education to patient re: Annual Wellness Visit and care coordination services Reviewed scheduled/upcoming provider appointments including Dr. Elease Hashimoto 03/02/22 Assessed social determinant of health barriers No further interventions needed pt to call to schedule Annual Wellness Visit          SDOH assessments and interventions completed:  Yes  SDOH Interventions Today    Flowsheet Row Most Recent Value  SDOH Interventions   Food Insecurity Interventions Intervention Not Indicated  Housing Interventions Intervention Not Indicated  Transportation Interventions Intervention Not Indicated        Care Coordination Interventions Activated:  Yes  Care Coordination Interventions:  Yes, provided   Follow up plan: No further intervention required.   Encounter Outcome:  Pt. Visit Completed  Peter Garter RN, BSN,CCM, CDE Care Management Coordinator [place] [facility] [phone]

## 2022-01-02 NOTE — Patient Instructions (Signed)
Visit Information  Thank you for taking time to visit with me today. Please don't hesitate to contact me if I can be of assistance to you.   Following are the goals we discussed today:   Goals Addressed             This Visit's Progress    COMPLETED: Care Coordination Activities - no follow up required       Care Coordination Interventions: Provided education to patient re: Annual Wellness Visit and care coordination services Reviewed scheduled/upcoming provider appointments including Dr. Elease Hashimoto 03/02/22 Assessed social determinant of health barriers No further interventions needed pt to call to schedule Annual Wellness Visit          If you are experiencing a Mental Health or [place] or need someone to talk to, please call the Suicide and Crisis Lifeline: 988 call the [place] National Suicide Prevention Lifeline: 985 805 6613 or TTY: 607-094-2765 TTY (709) 203-0044) to talk to a trained counselor call 1-800-273-TALK (toll free, 24 hour hotline) go to [facility] [address] [place] [phone]) call 911   Patient verbalizes understanding of instructions and care plan provided today and agrees to view in [place]. Active MyChart status and patient understanding of how to access instructions and care plan via MyChart confirmed with patient.     No further follow up required:    Peter Garter RN, Jackquline [place], [place] [facility] [phone]

## 2022-01-24 ENCOUNTER — Ambulatory Visit (INDEPENDENT_AMBULATORY_CARE_PROVIDER_SITE_OTHER): Payer: Medicare Other | Admitting: *Deleted

## 2022-01-24 DIAGNOSIS — Z23 Encounter for immunization: Secondary | ICD-10-CM | POA: Diagnosis not present

## 2022-02-14 DIAGNOSIS — H0102A Squamous blepharitis right eye, upper and lower eyelids: Secondary | ICD-10-CM | POA: Diagnosis not present

## 2022-02-14 DIAGNOSIS — H2513 Age-related nuclear cataract, bilateral: Secondary | ICD-10-CM | POA: Diagnosis not present

## 2022-02-14 DIAGNOSIS — H04123 Dry eye syndrome of bilateral lacrimal glands: Secondary | ICD-10-CM | POA: Diagnosis not present

## 2022-02-14 DIAGNOSIS — H0102B Squamous blepharitis left eye, upper and lower eyelids: Secondary | ICD-10-CM | POA: Diagnosis not present

## 2022-03-02 ENCOUNTER — Encounter: Payer: Self-pay | Admitting: Family Medicine

## 2022-03-02 ENCOUNTER — Ambulatory Visit (INDEPENDENT_AMBULATORY_CARE_PROVIDER_SITE_OTHER): Payer: Medicare Other | Admitting: Family Medicine

## 2022-03-02 VITALS — BP 150/70 | HR 70 | Temp 97.7°F | Ht 64.0 in | Wt 126.9 lb

## 2022-03-02 DIAGNOSIS — Z780 Asymptomatic menopausal state: Secondary | ICD-10-CM

## 2022-03-02 DIAGNOSIS — M8580 Other specified disorders of bone density and structure, unspecified site: Secondary | ICD-10-CM | POA: Diagnosis not present

## 2022-03-02 DIAGNOSIS — E785 Hyperlipidemia, unspecified: Secondary | ICD-10-CM | POA: Diagnosis not present

## 2022-03-02 DIAGNOSIS — Z0000 Encounter for general adult medical examination without abnormal findings: Secondary | ICD-10-CM | POA: Diagnosis not present

## 2022-03-02 DIAGNOSIS — I10 Essential (primary) hypertension: Secondary | ICD-10-CM | POA: Diagnosis not present

## 2022-03-02 DIAGNOSIS — Z1211 Encounter for screening for malignant neoplasm of colon: Secondary | ICD-10-CM | POA: Diagnosis not present

## 2022-03-02 LAB — HEPATIC FUNCTION PANEL
ALT: 13 U/L (ref 0–35)
AST: 15 U/L (ref 0–37)
Albumin: 4.4 g/dL (ref 3.5–5.2)
Alkaline Phosphatase: 77 U/L (ref 39–117)
Bilirubin, Direct: 0.1 mg/dL (ref 0.0–0.3)
Total Bilirubin: 0.5 mg/dL (ref 0.2–1.2)
Total Protein: 7.1 g/dL (ref 6.0–8.3)

## 2022-03-02 LAB — LIPID PANEL
Cholesterol: 149 mg/dL (ref 0–200)
HDL: 50.9 mg/dL (ref >39.00)
LDL Cholesterol: 67 mg/dL (ref 0–99)
NonHDL: 98.45
Total CHOL/HDL Ratio: 3
Triglycerides: 157 mg/dL — ABNORMAL HIGH (ref 0.0–149.0)
VLDL: 31.4 mg/dL (ref 0.0–40.0)

## 2022-03-02 LAB — BASIC METABOLIC PANEL
BUN: 20 mg/dL (ref 6–23)
CO2: 26 mEq/L (ref 19–32)
Calcium: 9.3 mg/dL (ref 8.4–10.5)
Chloride: 106 mEq/L (ref 96–112)
Creatinine, Ser: 0.74 mg/dL (ref 0.40–1.20)
GFR: 80.58 mL/min (ref >60.00)
Glucose, Bld: 98 mg/dL (ref 70–99)
Potassium: 4.3 mEq/L (ref 3.5–5.1)
Sodium: 141 mEq/L (ref 135–145)

## 2022-03-02 NOTE — Progress Notes (Signed)
Established Patient Office Visit  Subjective   Patient ID: Bridget Strong, female    DOB: 1950/12/08  Age: 71 y.o. MRN: [id number]  Chief Complaint  Patient presents with   Annual Exam    HPI   [name] is seen today for physical.  She has history of hypertension treated with lisinopril.  Her blood pressures have generally been well controlled with lisinopril.  She has history of osteopenia with last DEXA 2020.  She does take regular vitamin D and calcium.  She has hyperlipidemia treated with rosuvastatin.  She has history of left breast cancer.  She sees gynecologist regularly she has done Cologuard for colon cancer screening but last one we could find on record was 2018.  No recent change in stool habits.  Health maintenance reviewed.  Up-to-date with exception of needs Cologuard repeat.  She declines tetanus.  Family history and social history reviewed with no significant changes  Past Medical History:  Diagnosis Date   Breast cancer ([place]) 2019   Left Breast Cancer   Broken humerus 10-24-13   Cancer [facility])    breast cancer   Chicken pox    Complication of anesthesia 1998   Developed bigeminy post op hysterectomy   Hyperlipidemia    Hypertension    Personal history of radiation therapy 2019   Left Breast Cancer   Past Surgical History:  Procedure Laterality Date   ABDOMINAL HYSTERECTOMY  1998   TVH--ovaries remain   ABDOMINAL SACROCOLPOPEXY N/A 11/23/2014   Procedure: ABDOMINAL SACROCOLPOPEXY/HALBAN'S CULDOPLASTY;  Surgeon: Nunzio Cobbs, MD;  Location: [facility];  Service: Gynecology;  Laterality: N/A;   ANTERIOR AND POSTERIOR REPAIR N/A 11/23/2014   Procedure: ANTERIOR (CYSTOCELE) ;  Surgeon: Nunzio Cobbs, MD;  Location: [facility];  Service: Gynecology;  Laterality: N/A;   BLADDER SUSPENSION N/A 11/23/2014   Procedure: EXACT MIDURETERAL SLING;  Surgeon: Nunzio Cobbs, MD;  Location: [facility] [facility];  Service: Gynecology;  Laterality: N/A;   BREAST  LUMPECTOMY Left 2019   BREAST LUMPECTOMY WITH RADIOACTIVE SEED AND SENTINEL LYMPH NODE BIOPSY Left 07/04/2017   Procedure: RADIOACTIVE SEED GUIDED LEFT BREAST LUMPECTOMY, LEFT AXILLARY SENTINEL LYMPH NODE BIOPSY;  Surgeon: Jovita Kussmaul, MD;  Location: [place];  Service: General;  Laterality: Left;   CHOLECYSTECTOMY  2004   CYSTOSCOPY N/A 11/23/2014   Procedure: CYSTOSCOPY;  Surgeon: Nunzio Cobbs, MD;  Location: [facility];  Service: Gynecology;  Laterality: N/A;   LYSIS OF ADHESION N/A 11/23/2014   Procedure: LYSIS OF ADHESION;  Surgeon: Nunzio Cobbs, MD;  Location: [facility];  Service: Gynecology;  Laterality: N/A;   RE-EXCISION OF BREAST LUMPECTOMY Left 08/05/2017   Procedure: RE-EXCISION OF BREAST CANCER LATERAL MARGINS;  Surgeon: Jovita Kussmaul, MD;  Location: [place];  Service: General;  Laterality: Left;   SALPINGOOPHORECTOMY Bilateral 11/23/2014   Procedure: BILATERAL SALPINGO OOPHORECTOMY;  Surgeon: Nunzio Cobbs, MD;  Location: [facility];  Service: Gynecology;  Laterality: Bilateral;   TUBAL LIGATION      reports that she has never smoked. She has never used smokeless tobacco. She reports that she does not drink alcohol and does not use drugs. family history includes Alcohol abuse in her father and another family member; Arthritis in an other family member; Breast cancer (age of onset: 67) in her paternal grandmother; Breast cancer (age of onset: 48) in her maternal grandmother; Cancer in her brother; Early death  in her paternal aunt; Hypertension in her father and another family member; Stroke in her father and another family member. Allergies  Allergen Reactions   Diflucan [Fluconazole] Hives, Itching and Swelling   Hydrocodone Diarrhea and Nausea And Vomiting   Tramadol Other (See Comments)    Makes her sick and feel weird   Oxycodone Other (See Comments)    Dizziness     Review of Systems  Constitutional:  Negative for chills, fever,  malaise/fatigue and weight loss.  HENT:  Negative for hearing loss.   Eyes:  Negative for blurred vision and double vision.  Respiratory:  Negative for cough and shortness of breath.   Cardiovascular:  Negative for chest pain, palpitations and leg swelling.  Gastrointestinal:  Negative for abdominal pain, blood in stool, constipation and diarrhea.  Genitourinary:  Negative for dysuria.  Skin:  Negative for rash.  Neurological:  Negative for dizziness, speech change, seizures, loss of consciousness and headaches.  Psychiatric/Behavioral:  Negative for depression.       Objective:     BP (!) 150/70 (BP Location: Left Arm, Patient Position: Sitting, Cuff Size: Normal)   Pulse 70   Temp 97.7 F (36.5 C) (Oral)   Ht '5\' 4"'$  (1.626 m)   Wt 126 lb 14.4 oz (57.6 kg)   LMP 05/14/1996   SpO2 98%   BMI 21.78 kg/m    Physical Exam Vitals reviewed.  Constitutional:      Appearance: She is well-developed.  HENT:     Head: Normocephalic and atraumatic.  Eyes:     Pupils: Pupils are equal, round, and reactive to light.  Neck:     Thyroid: No thyromegaly.  Cardiovascular:     Rate and Rhythm: Normal rate and regular rhythm.     Heart sounds: Normal heart sounds. No murmur heard. Pulmonary:     Effort: No respiratory distress.     Breath sounds: Normal breath sounds. No wheezing or rales.  Abdominal:     General: Bowel sounds are normal. There is no distension.     Palpations: Abdomen is soft. There is no mass.     Tenderness: There is no abdominal tenderness. There is no guarding or rebound.  Musculoskeletal:        General: Normal range of motion.     Cervical back: Normal range of motion and neck supple.     Right lower leg: No edema.     Left lower leg: No edema.  Lymphadenopathy:     Cervical: No cervical adenopathy.  Skin:    Findings: No rash.  Neurological:     Mental Status: She is alert and oriented to person, place, and time.     Cranial Nerves: No cranial nerve  deficit.  Psychiatric:        Behavior: Behavior normal.        Thought Content: Thought content normal.        Judgment: Judgment normal.      No results found for any visits on 03/02/22.    The 10-year ASCVD risk score (Arnett DK, et al., 2019) is: 20%    Assessment & Plan:   Physical exam    we discussed the following health maintenance items:  -Flu vaccine already given -Declines tetanus.  Other vaccines up-to-date -Set up repeat DEXA scan -Cologuard ordered -Continue regular weightbearing exercise and calcium and vitamin D daily  -Check lipid and hepatic panel with her history of hyperlipidemia and check repeat basic metabolic panel  -Blood pressures closely  be in touch if systolic greater than 642 can consistently.  -Regarding her wrist pain suspect she has some de Quervain's tenosynovitis.  Recommend trial of some icing, topical Voltaren, and possibly thumb spica splint.   No follow-ups on file.    Carolann Littler, MD

## 2022-03-02 NOTE — Patient Instructions (Signed)
We have ordered for Cologuard  Set up bone density scan  Monitor blood pressure and be in touch if consistently > 140/90.  Try some icing, topical Voltaren gel, and thumb spica wrist splint for wrist tendonitis.

## 2022-03-05 MED ORDER — AMLODIPINE BESYLATE 5 MG PO TABS: 5.0000 mg | ORAL_TABLET | Freq: Every day | ORAL | 0 refills | Status: DC

## 2022-03-05 MED ORDER — LISINOPRIL 20 MG PO TABS: 20.0000 mg | ORAL_TABLET | Freq: Every day | ORAL | 3 refills | Status: DC

## 2022-03-05 MED ORDER — ROSUVASTATIN CALCIUM 20 MG PO TABS: 20.0000 mg | ORAL_TABLET | Freq: Every day | ORAL | 3 refills | Status: DC

## 2022-03-05 NOTE — Addendum Note (Signed)
Addended by: Nilda Riggs on: 03/05/2022 10:06 AM   Modules accepted: Orders

## 2022-03-07 ENCOUNTER — Ambulatory Visit (INDEPENDENT_AMBULATORY_CARE_PROVIDER_SITE_OTHER)
Admission: RE | Admit: 2022-03-07 | Discharge: 2022-03-07 | Disposition: A | Discharge status: Home / Self Care | Payer: Medicare Other | Source: Ambulatory Visit | Attending: Family Medicine | Admitting: Family Medicine

## 2022-03-07 DIAGNOSIS — Z780 Asymptomatic menopausal state: Secondary | ICD-10-CM

## 2022-03-27 ENCOUNTER — Other Ambulatory Visit: Payer: Self-pay | Admitting: Family Medicine

## 2022-03-28 NOTE — Telephone Encounter (Signed)
Spoke with the patient, she stated she has been taking Amlodipine and this has been working well for her.  Patient is aware the Rx was sent to [facility].

## 2022-03-29 ENCOUNTER — Encounter: Payer: Self-pay | Admitting: Family Medicine

## 2022-04-02 ENCOUNTER — Ambulatory Visit (INDEPENDENT_AMBULATORY_CARE_PROVIDER_SITE_OTHER): Payer: Medicare Other | Admitting: Family Medicine

## 2022-04-02 ENCOUNTER — Encounter: Payer: Self-pay | Admitting: Family Medicine

## 2022-04-02 VITALS — BP 116/60 | HR 68 | Temp 97.8°F | Ht 64.0 in | Wt 124.4 lb

## 2022-04-02 DIAGNOSIS — I10 Essential (primary) hypertension: Secondary | ICD-10-CM

## 2022-04-02 NOTE — Progress Notes (Signed)
Established Patient Office Visit  Subjective   Patient ID: Bridget Strong, female    DOB: [birth year]  Age: 71 y.o. MRN: [id number]  Chief Complaint  Patient presents with   Follow-up    HPI   Bridget Strong is seen for follow-up hypertension.  She had recent physical and had blood pressure 150/70.  She has been treated with lisinopril 20 mg daily for several years.  We had her monitor at home and she had several readings at home 115 systolic range and we went ahead and initiated amlodipine 5 mg daily.  She is tolerating well with no headaches or peripheral edema.  Her blood pressures been greatly improved.  Most of her systolics been around 726.  No orthostatic symptoms.  She had recent DEXA scan with essentially no change from prior tracing with only mild osteopenia with T score -1.6 at the hip.  She is very health-conscious with taking her vitamin D, calcium, and weightbearing exercise.  Past Medical History:  Diagnosis Date   Breast cancer ([place]) 2019   Left Breast Cancer   Broken humerus 10-24-13   Cancer [facility])    breast cancer   Chicken pox    Complication of anesthesia 1998   Developed bigeminy post op hysterectomy   Hyperlipidemia    Hypertension    Personal history of radiation therapy 2019   Left Breast Cancer   Past Surgical History:  Procedure Laterality Date   ABDOMINAL HYSTERECTOMY  1998   TVH--ovaries remain   ABDOMINAL SACROCOLPOPEXY N/A 11/23/2014   Procedure: ABDOMINAL SACROCOLPOPEXY/HALBAN'S CULDOPLASTY;  Surgeon: Nunzio Cobbs, MD;  Location: [facility];  Service: Gynecology;  Laterality: N/A;   ANTERIOR AND POSTERIOR REPAIR N/A 11/23/2014   Procedure: ANTERIOR (CYSTOCELE) ;  Surgeon: Nunzio Cobbs, MD;  Location: [facility] [facility];  Service: Gynecology;  Laterality: N/A;   BLADDER SUSPENSION N/A 11/23/2014   Procedure: EXACT MIDURETERAL SLING;  Surgeon: Nunzio Cobbs, MD;  Location: [facility];  Service: Gynecology;  Laterality: N/A;   BREAST  LUMPECTOMY Left 2019   BREAST LUMPECTOMY WITH RADIOACTIVE SEED AND SENTINEL LYMPH NODE BIOPSY Left 07/04/2017   Procedure: RADIOACTIVE SEED GUIDED LEFT BREAST LUMPECTOMY, LEFT AXILLARY SENTINEL LYMPH NODE BIOPSY;  Surgeon: Jovita Kussmaul, MD;  Location: [place];  Service: General;  Laterality: Left;   CHOLECYSTECTOMY  2004   CYSTOSCOPY N/A 11/23/2014   Procedure: CYSTOSCOPY;  Surgeon: Nunzio Cobbs, MD;  Location: [facility];  Service: Gynecology;  Laterality: N/A;   LYSIS OF ADHESION N/A 11/23/2014   Procedure: LYSIS OF ADHESION;  Surgeon: Nunzio Cobbs, MD;  Location: [facility];  Service: Gynecology;  Laterality: N/A;   RE-EXCISION OF BREAST LUMPECTOMY Left 08/05/2017   Procedure: RE-EXCISION OF BREAST CANCER LATERAL MARGINS;  Surgeon: Jovita Kussmaul, MD;  Location: [place];  Service: General;  Laterality: Left;   SALPINGOOPHORECTOMY Bilateral 11/23/2014   Procedure: BILATERAL SALPINGO OOPHORECTOMY;  Surgeon: Nunzio Cobbs, MD;  Location: [facility];  Service: Gynecology;  Laterality: Bilateral;   TUBAL LIGATION      reports that she has never smoked. She has never used smokeless tobacco. She reports that she does not drink alcohol and does not use drugs. family history includes Alcohol abuse in her father and another family member; Arthritis in an other family member; Breast cancer (age of onset: 38) in her paternal grandmother; Breast cancer (age of onset: 80) in her maternal grandmother;  Cancer in her brother; Early death in her paternal aunt; Hypertension in her father and another family member; Stroke in her father and another family member. Allergies  Allergen Reactions   Diflucan [Fluconazole] Hives, Itching and Swelling   Hydrocodone Diarrhea and Nausea And Vomiting   Tramadol Other (See Comments)    Makes her sick and feel weird   Oxycodone Other (See Comments)    Dizziness    Review of Systems  Constitutional:  Negative for malaise/fatigue.   Eyes:  Negative for blurred vision.  Respiratory:  Negative for shortness of breath.   Cardiovascular:  Negative for chest pain.  Neurological:  Negative for dizziness, weakness and headaches.      Objective:     BP 116/60 (BP Location: Left Arm, Patient Position: Sitting, Cuff Size: Normal)   Pulse 68   Temp 97.8 F (36.6 C) (Oral)   Ht '5\' 4"'$  (1.626 m)   Wt 124 lb 6.4 oz (56.4 kg)   LMP 05/14/1996   SpO2 99%   BMI 21.35 kg/m    Physical Exam Vitals reviewed.  Constitutional:      Appearance: Normal appearance.  Cardiovascular:     Rate and Rhythm: Normal rate and regular rhythm.  Pulmonary:     Effort: Pulmonary effort is normal.     Breath sounds: Normal breath sounds. No wheezing or rales.  Musculoskeletal:     Right lower leg: No edema.     Left lower leg: No edema.  Neurological:     Mental Status: She is alert.      No results found for any visits on 04/02/22.    The 10-year ASCVD risk score (Arnett DK, et al., 2019) is: 12.2%    Assessment & Plan:   Problem List Items Addressed This Visit       Unprioritized   Essential hypertension - Primary  Improved control after addition of amlodipine.  Patient tolerating well.  Continue current regimen of amlodipine 5 mg daily and lisinopril 20 mg daily.  Continue regular weightbearing exercise.  Continue yearly follow-up.  No follow-ups on file.    Carolann Littler, MD

## 2022-05-03 ENCOUNTER — Encounter: Payer: Self-pay | Admitting: Family Medicine

## 2022-05-03 ENCOUNTER — Telehealth (INDEPENDENT_AMBULATORY_CARE_PROVIDER_SITE_OTHER): Payer: Medicare Other | Admitting: Family Medicine

## 2022-05-03 VITALS — Ht 64.0 in

## 2022-05-03 DIAGNOSIS — Z0000 Encounter for general adult medical examination without abnormal findings: Secondary | ICD-10-CM

## 2022-05-03 NOTE — Progress Notes (Signed)
PATIENT CHECK-IN and HEALTH RISK ASSESSMENT QUESTIONNAIRE:  -completed by phone/video for upcoming Medicare Preventive Visit  Pre-Visit Check-in: 1)Vitals (height, wt, BP, etc) - record in vitals section for visit on day of visit 2)Review and Update Medications, Allergies PMH, Surgeries, Social history in Epic 3)Hospitalizations in the last year with date/reason?  No 4)Review and Update Care Team (patient's specialists) in Epic 5) Complete PHQ9 in Epic  6) Complete Fall Screening in Epic 7)Review all Health Maintenance Due and order under PCP if not done.  8)Medicare Wellness Questionnaire: Answer theses question about your habits: Do you drink alcohol? No If yes, how many drinks do you have a day?N/A Have you ever smoked? No  Quit date if applicable?  N/A How many packs a day do/did you smoke? N/A Do you use smokeless tobacco?No Do you use an illicit drugs?No Do you exercises? Walks and goes to the gym twice per week to do a strength training she also goes up and down her stairs frequently, thinks she gets > 150 minutes per week Are you sexually active? No  Number of partners?0 Typical breakfast: coffee Typical lunch: sandwich, soup with crackers Typical dinner: meat, potatoes, vegs Typical snacks:chips, peanuts Mostly eats at home and feels eats fresh fruit 3-4 times per day, veggies dinner  Beverages: lots of water, 1/2 of soda per day  Answer theses question about you: Can you perform most household chores?yes Do you find it hard to follow a conversation in a noisy room?no Do you often ask people to speak up or repeat themselves?no Do you feel that you have a problem with memory? no Do you balance your checkbook and or bank acounts? yes Do you feel safe at home?yes Last dentist visit?April 2023 Do you need assistance with any of the following: Please note if so  no assistance needed  Driving?  Feeding yourself?  Getting from bed to chair?  Getting to the toilet?  Bathing  or showering?  Dressing yourself?  Managing money?  Climbing a flight of stairs  Preparing meals?  Do you have Advanced Directives in place (Living Will, Healthcare Power or Attorney)?  yes   Last eye Exam and location?01/2022   Do you currently use prescribed or non-prescribed narcotic or opioid pain medications?no  Do you have a history or close family history of breast, ovarian, tubal or peritoneal cancer or a family member with BRCA (breast cancer susceptibility 1 and 2) gene mutations?  Nurse/Assistant Credentials/time stamp:   ----------------------------------------------------------------------------------------------------------------------------------------------------------------------------------------------------------------------   MEDICARE ANNUAL PREVENTIVE VISIT WITH PROVIDER: (Welcome to [employer], initial annual wellness or annual wellness exam)  Virtual Visit via phone Note  I connected with Bridget Strong  On  05/03/2022  phone enabled telemedicine application and verified that I am speaking with the correct person using two identifiers.  Location patient: home Location provider:work or home office Persons participating in the virtual visit: patient, provider  Concerns and/or follow up today: none   See HM section in Epic for other details of completed HM.    ROS: negative for report of fevers, unintentional weight loss, vision changes, vision loss, hearing loss or change, chest pain, sob, hemoptysis, melena, hematochezia, hematuria, genital discharge or lesions, falls, bleeding or bruising, loc, thoughts of suicide or self harm, memory loss  Patient-completed extensive health risk assessment - reviewed and discussed with the patient: See Health Risk Assessment completed with patient prior to the visit either above or in recent phone note. This was reviewed in detailed with the patient today and appropriate recommendations,  orders and referrals were placed as  needed per Summary below and patient instructions.   Review of Medical History: -PMH, [place], Family History and current specialty and care providers reviewed and updated and listed below   Patient Care Team: Eulas Post, MD as PCP - General (Family Medicine) Truitt Merle, MD as Consulting Physician (Hematology) Delice Bison, Charlestine Massed, NP as Nurse Practitioner (Hematology and Oncology) Kyung Rudd, MD as Consulting Physician (Radiation Oncology) Jovita Kussmaul, MD as Consulting Physician (General Surgery)   Past Medical History:  Diagnosis Date   Breast cancer [facility]) 2019   Left Breast Cancer   Broken humerus 10-24-13   Cancer [facility])    breast cancer   Chicken pox    Complication of anesthesia 1998   Developed bigeminy post op hysterectomy   Hyperlipidemia    Hypertension    Personal history of radiation therapy 2019   Left Breast Cancer    Past Surgical History:  Procedure Laterality Date   ABDOMINAL HYSTERECTOMY  1998   TVH--ovaries remain   ABDOMINAL SACROCOLPOPEXY N/A 11/23/2014   Procedure: ABDOMINAL SACROCOLPOPEXY/HALBAN'S CULDOPLASTY;  Surgeon: Nunzio Cobbs, MD;  Location: [place] [facility];  Service: Gynecology;  Laterality: N/A;   ANTERIOR AND POSTERIOR REPAIR N/A 11/23/2014   Procedure: ANTERIOR (CYSTOCELE) ;  Surgeon: Nunzio Cobbs, MD;  Location: [place] [facility];  Service: Gynecology;  Laterality: N/A;   BLADDER SUSPENSION N/A 11/23/2014   Procedure: EXACT MIDURETERAL SLING;  Surgeon: Nunzio Cobbs, MD;  Location: [place] [facility];  Service: Gynecology;  Laterality: N/A;   BREAST LUMPECTOMY Left 2019   BREAST LUMPECTOMY WITH RADIOACTIVE SEED AND SENTINEL LYMPH NODE BIOPSY Left 07/04/2017   Procedure: RADIOACTIVE SEED GUIDED LEFT BREAST LUMPECTOMY, LEFT AXILLARY SENTINEL LYMPH NODE BIOPSY;  Surgeon: Jovita Kussmaul, MD;  Location: [place];  Service: General;  Laterality: Left;   CHOLECYSTECTOMY  2004   CYSTOSCOPY N/A 11/23/2014   Procedure: CYSTOSCOPY;  Surgeon:  Nunzio Cobbs, MD;  Location: [facility] [facility];  Service: Gynecology;  Laterality: N/A;   LYSIS OF ADHESION N/A 11/23/2014   Procedure: LYSIS OF ADHESION;  Surgeon: Nunzio Cobbs, MD;  Location: [place] [facility];  Service: Gynecology;  Laterality: N/A;   RE-EXCISION OF BREAST LUMPECTOMY Left 08/05/2017   Procedure: RE-EXCISION OF BREAST CANCER LATERAL MARGINS;  Surgeon: Jovita Kussmaul, MD;  Location: [place];  Service: General;  Laterality: Left;   SALPINGOOPHORECTOMY Bilateral 11/23/2014   Procedure: BILATERAL SALPINGO OOPHORECTOMY;  Surgeon: Nunzio Cobbs, MD;  Location: [facility];  Service: Gynecology;  Laterality: Bilateral;   TUBAL LIGATION      Social History   Socioeconomic History   Marital status: Married    Spouse name: Not on file   Number of children: Not on file   Years of education: Not on file   Highest education level: 12th grade  Occupational History   Not on file  Tobacco Use   Smoking status: Never   Smokeless tobacco: Never  Vaping Use   Vaping Use: Never used  Substance and Sexual Activity   Alcohol use: No    Alcohol/week: 0.0 standard drinks of alcohol   Drug use: No   Sexual activity: Not Currently    Partners: Male    Birth control/protection: Surgical    Comment: TVH-ovaries remain, first intercourse >14  Other Topics Concern   Not on file  Social History Narrative   Not on file   Social  Determinants of Health   Financial Resource Strain: Low Risk  (04/01/2022)   Overall Financial Resource Strain (CARDIA)    Difficulty of Paying Living Expenses: Not hard at all  Food Insecurity: No Food Insecurity (04/01/2022)   Hunger Vital Sign    Worried About Running Out of Food in the Last Year: Never true    Ran Out of Food in the Last Year: Never true  Transportation Needs: No Transportation Needs (04/01/2022)   PRAPARE - Hydrologist (Medical): No    Lack of Transportation (Non-Medical): No   Physical Activity: Sufficiently Active (04/01/2022)   Exercise Vital Sign    Days of Exercise per Week: 5 days    Minutes of Exercise per Session: 30 min  Stress: No Stress Concern Present (04/01/2022)   [place]    Feeling of Stress : Not at all  Social Connections: [place] (04/01/2022)   Social Connection and Isolation Panel [NHANES]    Frequency of Communication with Friends and Family: More than three times a week    Frequency of Social Gatherings with Friends and Family: Three times a week    Attends Religious Services: More than 4 times per year    Active Member of Clubs or Organizations: Yes    Attends Music therapist: More than 4 times per year    Marital Status: Married  Human resources officer Violence: Not on file    Family History  Problem Relation Age of Onset   Hypertension Father    Stroke Father    Alcohol abuse Father    Cancer Brother        bladder cancer   Early death Paternal Aunt    Breast cancer Maternal Grandmother 62   Breast cancer Paternal Grandmother 13   Alcohol abuse Other    Arthritis Other    Hypertension Other    Stroke Other     Current Outpatient Medications on File Prior to Visit  Medication Sig Dispense Refill   amLODipine (NORVASC) 5 MG tablet TAKE 1 TABLET (5 MG TOTAL) BY MOUTH DAILY. 90 tablet 0   Calcium Carbonate-Vitamin D (CALCIUM-VITAMIN D) 600-200 MG-UNIT CAPS Take 1 tablet by mouth 2 (two) times daily.     gabapentin (NEURONTIN) 300 MG capsule TAKE 1 CAPSULE BY MOUTH EVERYDAY AT BEDTIME 90 capsule 3   lisinopril (ZESTRIL) 20 MG tablet Take 1 tablet (20 mg total) by mouth daily. 90 tablet 3   rosuvastatin (CRESTOR) 20 MG tablet Take 1 tablet (20 mg total) by mouth daily. 90 tablet 3   vitamin C (ASCORBIC ACID) 500 MG tablet Take 1,000 mg by mouth daily.     No current facility-administered medications on file prior to visit.    Allergies   Allergen Reactions   Diflucan [Fluconazole] Hives, Itching and Swelling   Hydrocodone Diarrhea and Nausea And Vomiting   Tramadol Other (See Comments)    Makes her sick and feel weird   Oxycodone Other (See Comments)    Dizziness       Physical Exam There were no vitals filed for this visit. Estimated body mass index is 21.35 kg/m as calculated from the following:   Height as of this encounter: _0  (1.626 m).   Weight as of 04/02/22: 124 lb 6.4 oz (56.4 kg).  EKG (optional): deferred due to virtual visit  GENERAL: alert, oriented, appears well and in no acute distress; visual acuity grossly intact, full vision  exam deferred due to pandemic and/or virtual encounter  PSYCH/NEURO: pleasant and cooperative, no obvious depression or anxiety, speech and thought processing grossly intact, Cognitive function grossly intact  Flowsheet Row Video Visit from 05/03/2022 in [place] at [facility]  PHQ-9 Total Score 0           05/03/2022   11:29 AM 03/02/2022    9:00 AM 03/01/2021    9:00 AM 03/01/2021    8:59 AM 02/27/2019    8:51 AM  Depression screen PHQ 2/9  Decreased Interest 0 0 0 0 0  Down, Depressed, Hopeless 0 0 0 0 0  PHQ - 2 Score 0 0 0 0 0  Altered sleeping 0      Tired, decreased energy 0      Change in appetite 0      Feeling bad or failure about yourself  0      Trouble concentrating 0      Moving slowly or fidgety/restless 0      Suicidal thoughts 0      PHQ-9 Score 0      Difficult doing work/chores Not difficult at all           11/05/2017    1:18 PM 03/01/2021    8:59 AM 03/02/2022    9:00 AM 04/01/2022    6:44 PM 05/03/2022   11:29 AM  Fall Risk  Falls in the past year? No 0 0 0 0  Was there an injury with Fall?   0  0  Fall Risk Category Calculator   0  0  Fall Risk Category   Low  Low  Patient Fall Risk Level   Low fall risk  Low fall risk  Patient at Risk for Falls Due to   No Fall Risks    Fall risk Follow up   Falls evaluation  completed       SUMMARY AND PLAN:  Medicare annual wellness visit, subsequent   Discussed applicable health maintenance/preventive health measures and advised and referred or ordered per patient preferences:  Health Maintenance  Topic Date Due   COVID-19 Vaccine (6 - 2023-24 season) 01/13/2023 (Originally 04/26/2022), discussed, declined   Fecal DNA (Cologuard)  03/25/2023   Medicare Annual Wellness (AWV)  05/04/2023   MAMMOGRAM  08/26/2023   Pneumonia Vaccine 53+ Years old  Completed   INFLUENZA VACCINE  Completed   DEXA SCAN  Completed   Hepatitis C Screening  Completed   Zoster Vaccines- Shingrix  Completed   HPV VACCINES  Aged Out   DTaP/Tdap/Td  Discontinued   COLONOSCOPY (Pts 45-67yr Insurance coverage will need to be confirmed)  Discontinued     Vaccines   Mammogram   Screening Pap smear/pelvic exam   Lung Cancer Screening   Colorectal cancer screening   Osteoporosis screening if applicable  Screening for glaucoma  Statin use for primary prevention  Cardiovascular screening blood tests   Diabetes screening tests  Hepatitis B screening if applicable  HIV screening   Hepatitis C screening   Chlamydia/Gonorrhea screening  STI Screening   Syphilis Screening if applicable  Latent TB screening if applicable  Breast Cancer prevention medication if applicable  BRCA related risk assessment and referral for counseling if applicable  Education and counseling on the following was provided based on the above review of health and a plan/checklist for the patient, along with additional information discussed, was provided for the patient in the patient instructions :  -Advised on importance of and resources for  completing advanced directives -Provided counseling and plan for difficulty hearing discussed/referral to audiology or ENT if applicable per above screening. -Provided counseling and plan for increased risk of falling if applicable per above  screening. (Referral for PT, community based exercise programs, etc.) -Provided counseling and plan for function difficulties/ difficulties with ADLs if applicable per above screening. -Advised and counseled on maintaining healthy weight and healthy lifestyle - including the importance of a health diet, regular physical activity, social connections and stress management. -Advised and counseled on a whole foods based healthy diet and regular exercise: discussed a heart healthy whole foods based diet at length. A summary of a healthy diet was provided in the Patient Instructions.Offered referral to dietician/weight management clinic if applicable and follow up virtual visits to assist further and monitor progress. Recommended regular exercise and discussed options within the community.  -Advised yearly dental visits at minimum and regular eye exams -Advised and counseled on alcohol, tobacco, drug, opoid use/misuse if applicable and options for help if applicable.  Follow up: see patient instructions     Patient Instructions  I really enjoyed getting to talk with you today! I am available on Tuesdays and Thursdays for virtual visits if you have any questions or concerns, or if I can be of any further assistance.   CHECKLIST FROM ANNUAL WELLNESS VISIT:  -Follow up (please call to schedule if not scheduled after visit): -yearly for annual wellness visit with primary care office  Here is a list of your preventive care/health maintenance measures and the plan for each if any are due:  Health Maintenance  Topic Date Due   COVID-19 Vaccine (6 - 2023-24 season) 01/13/2023 (Originally 04/26/2022)   Fecal DNA (Cologuard)  03/25/2023   Medicare Annual Wellness (AWV)  05/04/2023   MAMMOGRAM  08/26/2023   Pneumonia Vaccine 62+ Years old  Completed   INFLUENZA VACCINE  Completed   DEXA SCAN  Completed   Hepatitis C Screening  Completed   Zoster Vaccines- Shingrix  Completed   HPV VACCINES  Aged Out    DTaP/Tdap/Td  Discontinued   COLONOSCOPY (Pts 45-20yr Insurance coverage will need to be confirmed)  Discontinued    -See a dentist at least yearly  -Get your eyes checked on a regular basis  -Other issues addressed today:  -I have included below further information regarding a healthy whole foods based diet, physical activity guidelines for adults, stress management and opportunities for social connections. I hope you find this information useful.   -----------------------------------------------------------------------------------------------------------------------------------------------------------------------------------------------------------------------------------------------------------  NUTRITION: -eat real food: lots of colorful vegetables (half the plate) and fruits -5-7 servings of vegetables and fruits per day (fresh or steamed is best), exp. 2 servings of vegetables with lunch and dinner and 2 servings of fruit per day. Berries and greens such as kale and collards are great choices.  -consume on a regular basis: whole grains (make sure first ingredient on label contains the word "whole"), fresh fruits, fish, nuts, seeds, healthy oils (such as olive oil, avocado oil, grape seed oil) -may eat small amounts of dairy and lean meat on occasion, but avoid processed meats such as ham, bacon, lunch meat, etc. -drink water -try to avoid fast food and pre-packaged foods, processed meat -most experts advise limiting sodium to < 23013mper day, should limit further is any chronic conditions such as high blood pressure, heart disease, diabetes, etc. The American Heart Association advised that < 150023ms is ideal -try to avoid foods that contain any ingredients with names you do  not recognize  -try to avoid sugar/sweets (except for the natural sugar that occurs in fresh fruit) -try to avoid sweet drinks -try to avoid white rice, white bread, pasta (unless whole grain), white or  yellow potatoes  EXERCISE GUIDELINES FOR ADULTS: -if you wish to increase your physical activity, do so gradually and with the approval of your doctor -STOP and seek medical care immediately if you have any chest pain, chest discomfort or trouble breathing when starting or increasing exercise  -move and stretch your body, legs, feet and arms when sitting for long periods  -Physical activity guidelines for optimal health in adults: -least 150 minutes per week of aerobic exercise (can talk, but not sing) once approved by your doctor, 20-30 minutes of sustained activity or two 10 minute episodes of sustained activity every day.  -resistance training at least 2 days per week if approved by your doctor  -balance exercises 3+ days per week:   Stand somewhere where you have something sturdy to hold onto if you lose balance.    1) lift up on toes, start with 5x per day and work up to 20x   2) stand and lift on leg straight out to the side so that foot is a few inches of the floor, start with 5x each side and work up to 20x each side   3) stand on one foot, start with 5 seconds each side and work up to 20 seconds on each side              Lucretia Kern, DO

## 2022-05-03 NOTE — Patient Instructions (Signed)
I really enjoyed getting to talk with you today! I am available on Tuesdays and Thursdays for virtual visits if you have any questions or concerns, or if I can be of any further assistance.   CHECKLIST FROM ANNUAL WELLNESS VISIT:  -Follow up (please call to schedule if not scheduled after visit): -yearly for annual wellness visit with primary care office  Here is a list of your preventive care/health maintenance measures and the plan for each if any are due:  Health Maintenance  Topic Date Due   COVID-19 Vaccine (6 - 2023-24 season) 01/13/2023 (Originally 04/26/2022)   Fecal DNA (Cologuard)  03/25/2023   Medicare Annual Wellness (AWV)  05/04/2023   MAMMOGRAM  08/26/2023   Pneumonia Vaccine 56+ Years old  Completed   INFLUENZA VACCINE  Completed   DEXA SCAN  Completed   Hepatitis C Screening  Completed   Zoster Vaccines- Shingrix  Completed   HPV VACCINES  Aged Out   DTaP/Tdap/Td  Discontinued   COLONOSCOPY (Pts 45-32yr Insurance coverage will need to be confirmed)  Discontinued    -See a dentist at least yearly  -Get your eyes checked on a regular basis  -Other issues addressed today:  -I have included below further information regarding a healthy whole foods based diet, physical activity guidelines for adults, stress management and opportunities for social connections. I hope you find this information useful.     NUTRITION: -eat real food: lots of colorful vegetables (half the plate) and fruits -5-7 servings of vegetables and fruits per day (fresh or steamed is best), exp. 2 servings of vegetables with lunch and dinner and 2 servings of fruit per day. Berries and greens such as kale and collards are great choices.  -consume on a  regular basis: whole grains (make sure first ingredient on label contains the word "whole"), fresh fruits, fish, nuts, seeds, healthy oils (such as olive oil, avocado oil, grape seed oil) -may eat small amounts of dairy and lean meat on occasion, but avoid processed meats such as ham, bacon, lunch meat, etc. -drink water -try to avoid fast food and pre-packaged foods, processed meat -most experts advise limiting sodium to < '2300mg'$  per day, should limit further is any chronic conditions such as high blood pressure, heart disease, diabetes, etc. The American Heart Association advised that < '1500mg'$  is is ideal -try to avoid foods that contain any ingredients with names you do not recognize  -try to avoid sugar/sweets (except for the natural sugar that occurs in fresh fruit) -try to avoid sweet drinks -try to avoid white rice, white bread, pasta (unless whole grain), white or yellow potatoes  EXERCISE GUIDELINES FOR ADULTS: -if you wish to increase your physical activity, do so gradually and with the approval of your doctor -STOP and seek medical care immediately if you have any chest pain, chest discomfort or trouble breathing when starting or increasing exercise  -move and stretch your body, legs, feet and arms when sitting for long periods  -Physical activity guidelines for optimal health in adults: -least 150 minutes per week of aerobic exercise (can talk, but not sing) once approved by your doctor, 20-30 minutes of sustained activity or two 10 minute episodes of sustained activity every day.  -resistance training at least 2 days per week if approved by your doctor  -balance exercises 3+ days per week:   Stand somewhere where you have something sturdy to hold onto if you lose balance.    1) lift up on toes, start with 5x  per day and work up to 20x   2) stand and lift on leg straight out to the side so that foot is a few inches of the floor, start with 5x each side and work up to 20x each  side   3) stand on one foot, start with 5 seconds each side and work up to 20 seconds on each side

## 2022-05-17 ENCOUNTER — Other Ambulatory Visit: Payer: Self-pay | Admitting: Obstetrics and Gynecology

## 2022-05-17 DIAGNOSIS — Z1231 Encounter for screening mammogram for malignant neoplasm of breast: Secondary | ICD-10-CM

## 2022-06-13 ENCOUNTER — Other Ambulatory Visit: Payer: Self-pay | Admitting: Family Medicine

## 2022-07-18 NOTE — Progress Notes (Signed)
72 y.o. G35P3013 Married Caucasian female here for 1 yr med f/u.    Patient is followed for her menopausal symptoms treated with Gabapentin and recurrent cystocele following her prolapse repair.   The gabapentin works well.  She has 2 - 3 hot flashes at night and none during the day.  No dizziness or lightheadedness.  If she skips a dosage, she cannot sleep.  She does not take any other medications that can sedate.   Up to empty had bladder several times at night when she wakes up with hot flashes.  No urinary leakage.  No problems with BMs.  No vaginal bulge.   PCP:  Dr. Elease Hashimoto   Patient's last menstrual period was 05/14/1996.           Sexually active: No.  The current method of family planning is status post hysterectomy.    Exercising: Yes.     Gym 2x a week, walking 4-5 miles  Smoker:  no  Health Maintenance: Pap:  06/14/17 neg, 04/23/12 History of abnormal Pap:  no MMG:  08/25/21 Breast Density Category B, BI-RADS CATEGORY 1 Neg Colonoscopy:  02/2021 cologuard neg BMD:   03/07/22  Result  low bone density.  Followed by PCP. TDaP:  PCP Gardasil:   no HIV: no Hep C: 02/09/16 neg Screening Labs:  PCP   reports that she has never smoked. She has never used smokeless tobacco. She reports that she does not drink alcohol and does not use drugs.  Past Medical History:  Diagnosis Date   Breast cancer ([facility]) 2019   Left Breast Cancer   Broken humerus 10-24-13   Cancer [facility])    breast cancer   Chicken pox    Complication of anesthesia 1998   Developed bigeminy post op hysterectomy   Hyperlipidemia    Hypertension    Personal history of radiation therapy 2019   Left Breast Cancer    Past Surgical History:  Procedure Laterality Date   ABDOMINAL HYSTERECTOMY  1998   TVH--ovaries remain   ABDOMINAL SACROCOLPOPEXY N/A 11/23/2014   Procedure: ABDOMINAL SACROCOLPOPEXY/HALBAN'S CULDOPLASTY;  Surgeon: Nunzio Cobbs, MD;  Location: [facility];  Service: Gynecology;   Laterality: N/A;   ANTERIOR AND POSTERIOR REPAIR N/A 11/23/2014   Procedure: ANTERIOR (CYSTOCELE) ;  Surgeon: Nunzio Cobbs, MD;  Location: [facility] [place] [facility];  Service: Gynecology;  Laterality: N/A;   BLADDER SUSPENSION N/A 11/23/2014   Procedure: EXACT MIDURETERAL SLING;  Surgeon: Nunzio Cobbs, MD;  Location: [place] [facility];  Service: Gynecology;  Laterality: N/A;   BREAST LUMPECTOMY Left 2019   BREAST LUMPECTOMY WITH RADIOACTIVE SEED AND SENTINEL LYMPH NODE BIOPSY Left 07/04/2017   Procedure: RADIOACTIVE SEED GUIDED LEFT BREAST LUMPECTOMY, LEFT AXILLARY SENTINEL LYMPH NODE BIOPSY;  Surgeon: Jovita Kussmaul, MD;  Location: [place];  Service: General;  Laterality: Left;   CHOLECYSTECTOMY  2004   CYSTOSCOPY N/A 11/23/2014   Procedure: CYSTOSCOPY;  Surgeon: Nunzio Cobbs, MD;  Location: [facility];  Service: Gynecology;  Laterality: N/A;   LYSIS OF ADHESION N/A 11/23/2014   Procedure: LYSIS OF ADHESION;  Surgeon: Nunzio Cobbs, MD;  Location: [facility];  Service: Gynecology;  Laterality: N/A;   RE-EXCISION OF BREAST LUMPECTOMY Left 08/05/2017   Procedure: RE-EXCISION OF BREAST CANCER LATERAL MARGINS;  Surgeon: Jovita Kussmaul, MD;  Location: [place];  Service: General;  Laterality: Left;   SALPINGOOPHORECTOMY Bilateral 11/23/2014   Procedure: BILATERAL SALPINGO OOPHORECTOMY;  Surgeon: Nunzio Cobbs, MD;  Location: [facility];  Service: Gynecology;  Laterality: Bilateral;   TUBAL LIGATION      Current Outpatient Medications  Medication Sig Dispense Refill   amLODipine (NORVASC) 5 MG tablet TAKE 1 TABLET (5 MG TOTAL) BY MOUTH DAILY. 90 tablet 0   Calcium Carbonate-Vitamin D (CALCIUM-VITAMIN D) 600-200 MG-UNIT CAPS Take 1 tablet by mouth 2 (two) times daily.     gabapentin (NEURONTIN) 300 MG capsule TAKE 1 CAPSULE BY MOUTH EVERYDAY AT BEDTIME 90 capsule 3   lisinopril (ZESTRIL) 20 MG tablet Take 1 tablet (20 mg total) by mouth daily. 90 tablet 3   rosuvastatin  (CRESTOR) 20 MG tablet Take 1 tablet (20 mg total) by mouth daily. 90 tablet 3   vitamin C (ASCORBIC ACID) 500 MG tablet Take 1,000 mg by mouth daily.     No current facility-administered medications for this visit.    Family History  Problem Relation Age of Onset   Hypertension Father    Stroke Father    Alcohol abuse Father    Cancer Brother        bladder cancer   Early death Paternal Aunt    Breast cancer Maternal Grandmother 90   Breast cancer Paternal Grandmother 29   Alcohol abuse Other    Arthritis Other    Hypertension Other    Stroke Other     Review of Systems  Exam:   BP 134/82 (BP Location: Right Arm, Patient Position: Sitting, Cuff Size: Normal)   Pulse 71   Ht 5' 4.5" (1.638 m)   Wt 128 lb (58.1 kg)   LMP 05/14/1996   SpO2 98%   BMI 21.63 kg/m     General appearance: alert, cooperative and appears stated age Head: normocephalic, without obvious abnormality, atraumatic Lungs: clear to auscultation bilaterally Heart: regular rate and rhythm Abdomen: soft, non-tender; no masses, no organomegaly  Pelvic: External genitalia:  no lesions              No abnormal inguinal nodes palpated.              Urethra:  normal appearing urethra with no masses, tenderness or lesions              Bartholins and Skenes: normal                 Vagina: normal appearing vagina with normal color and discharge, no lesions.  Second degree cystocele.  Good apical and posterior vaginal support.              Cervix: absent            Bimanual Exam:  Uterus:  absent              Adnexa: no mass, fullness, tenderness              Rectal exam: yes.  Confirms.              Anus:  normal sphincter tone, no lesions  Chaperone was present for exam:  Emily  Assessment:     Menopausal symptoms.  Encounter for medication monitoring.  Recurrent cystocele.  Stable. Currently not symptomatic. Status post hysterectomy. Status post abdominal sacrocolpopexy, Halban's culdoplasty, BSO,  lysis of adhesions, TVT exact midurethral sling, cystoscopy, anterior colporrhaphy.  Plan:  We discussed gabapentin, risks and benefits.  Will try to start weaning off Gabapentin and reduce to 200 mg q hs.  #180, RF 3.  She will  return if she has difficulty emptying her bladder or recurrent bladder infections.  FU in one year and prn.  After visit summary provided.   23 min  total time was spent for this patient encounter, including preparation, face-to-face counseling with the patient, coordination of care, and documentation of the encounter.

## 2022-08-01 ENCOUNTER — Ambulatory Visit (INDEPENDENT_AMBULATORY_CARE_PROVIDER_SITE_OTHER): Payer: Medicare Other | Admitting: Obstetrics and Gynecology

## 2022-08-01 ENCOUNTER — Encounter: Payer: Self-pay | Admitting: Obstetrics and Gynecology

## 2022-08-01 VITALS — BP 134/82 | HR 71 | Ht 64.5 in | Wt 128.0 lb

## 2022-08-01 DIAGNOSIS — Z5181 Encounter for therapeutic drug level monitoring: Secondary | ICD-10-CM | POA: Diagnosis not present

## 2022-08-01 DIAGNOSIS — N8110 Cystocele, unspecified: Secondary | ICD-10-CM

## 2022-08-01 DIAGNOSIS — N951 Menopausal and female climacteric states: Secondary | ICD-10-CM

## 2022-08-01 MED ORDER — GABAPENTIN 100 MG PO CAPS: ORAL_CAPSULE | ORAL | 3 refills | Status: DC

## 2022-08-27 ENCOUNTER — Ambulatory Visit: Payer: Medicare Other

## 2022-09-03 ENCOUNTER — Ambulatory Visit
Admission: RE | Admit: 2022-09-03 | Discharge: 2022-09-03 | Disposition: A | Discharge status: Home / Self Care | Payer: Medicare Other | Source: Ambulatory Visit | Attending: Obstetrics and Gynecology | Admitting: Obstetrics and Gynecology

## 2022-09-03 DIAGNOSIS — Z1231 Encounter for screening mammogram for malignant neoplasm of breast: Secondary | ICD-10-CM | POA: Diagnosis not present

## 2022-09-09 ENCOUNTER — Other Ambulatory Visit: Payer: Self-pay | Admitting: Family Medicine

## 2022-12-06 ENCOUNTER — Other Ambulatory Visit: Payer: Self-pay | Admitting: Family Medicine

## 2023-01-02 ENCOUNTER — Other Ambulatory Visit: Payer: Self-pay | Admitting: Family Medicine

## 2023-01-08 DIAGNOSIS — Z1211 Encounter for screening for malignant neoplasm of colon: Secondary | ICD-10-CM | POA: Diagnosis not present

## 2023-01-24 ENCOUNTER — Other Ambulatory Visit: Payer: Self-pay

## 2023-01-24 MED ORDER — GABAPENTIN 100 MG PO CAPS: ORAL_CAPSULE | ORAL | 1 refills | Status: DC

## 2023-01-24 NOTE — Telephone Encounter (Signed)
Refill of gabapentin 200 mg q hs, which has been her recent dosage.   Please let me know if the patient is requesting a higher dosage.

## 2023-01-24 NOTE — Telephone Encounter (Signed)
Medication refill request: Gabapentin 300mg   Last AEX:  09/23/21 Next AEX: 08/05/23 Last MMG (if hormonal medication request): n/a Refill authorized: #[id number] with 3 rf pended for today

## 2023-02-15 ENCOUNTER — Ambulatory Visit (INDEPENDENT_AMBULATORY_CARE_PROVIDER_SITE_OTHER): Payer: Medicare Other

## 2023-02-15 DIAGNOSIS — Z23 Encounter for immunization: Secondary | ICD-10-CM | POA: Diagnosis not present

## 2023-02-20 DIAGNOSIS — H0102A Squamous blepharitis right eye, upper and lower eyelids: Secondary | ICD-10-CM | POA: Diagnosis not present

## 2023-02-20 DIAGNOSIS — H04123 Dry eye syndrome of bilateral lacrimal glands: Secondary | ICD-10-CM | POA: Diagnosis not present

## 2023-02-20 DIAGNOSIS — H0102B Squamous blepharitis left eye, upper and lower eyelids: Secondary | ICD-10-CM | POA: Diagnosis not present

## 2023-02-20 DIAGNOSIS — H2513 Age-related nuclear cataract, bilateral: Secondary | ICD-10-CM | POA: Diagnosis not present

## 2023-03-07 ENCOUNTER — Other Ambulatory Visit: Payer: Self-pay | Admitting: Family Medicine

## 2023-03-08 ENCOUNTER — Encounter: Payer: Self-pay | Admitting: Family Medicine

## 2023-03-08 ENCOUNTER — Ambulatory Visit (INDEPENDENT_AMBULATORY_CARE_PROVIDER_SITE_OTHER): Payer: Medicare Other | Admitting: Family Medicine

## 2023-03-08 VITALS — BP 138/60 | HR 69 | Temp 98.0°F | Ht 64.17 in | Wt 124.9 lb

## 2023-03-08 DIAGNOSIS — E785 Hyperlipidemia, unspecified: Secondary | ICD-10-CM | POA: Diagnosis not present

## 2023-03-08 DIAGNOSIS — M8580 Other specified disorders of bone density and structure, unspecified site: Secondary | ICD-10-CM

## 2023-03-08 DIAGNOSIS — I10 Essential (primary) hypertension: Secondary | ICD-10-CM | POA: Diagnosis not present

## 2023-03-08 DIAGNOSIS — Z0000 Encounter for general adult medical examination without abnormal findings: Secondary | ICD-10-CM

## 2023-03-08 LAB — CBC WITH DIFFERENTIAL/PLATELET
Basophils Absolute: 0 10*3/uL (ref 0.0–0.1)
Basophils Relative: 0.6 % (ref 0.0–3.0)
Eosinophils Absolute: 0.1 10*3/uL (ref 0.0–0.7)
Eosinophils Relative: 1.1 % (ref 0.0–5.0)
HCT: 40.7 % (ref 36.0–46.0)
Hemoglobin: 13.1 g/dL (ref 12.0–15.0)
Lymphocytes Relative: 36.5 % (ref 12.0–46.0)
Lymphs Abs: 1.7 10*3/uL (ref 0.7–4.0)
MCHC: 32.1 g/dL (ref 30.0–36.0)
MCV: 94.1 fL (ref 78.0–100.0)
Monocytes Absolute: 0.3 10*3/uL (ref 0.1–1.0)
Monocytes Relative: 7.2 % (ref 3.0–12.0)
Neutro Abs: 2.6 10*3/uL (ref 1.4–7.7)
Neutrophils Relative %: 54.6 % (ref 43.0–77.0)
Platelets: 246 10*3/uL (ref 150.0–400.0)
RBC: 4.33 Mil/uL (ref 3.87–5.11)
RDW: 13.4 % (ref 11.5–15.5)
WBC: 4.8 10*3/uL (ref 4.0–10.5)

## 2023-03-08 LAB — COMPREHENSIVE METABOLIC PANEL
ALT: 14 U/L (ref 0–35)
AST: 16 U/L (ref 0–37)
Albumin: 4.5 g/dL (ref 3.5–5.2)
Alkaline Phosphatase: 81 U/L (ref 39–117)
BUN: 15 mg/dL (ref 6–23)
CO2: 30 meq/L (ref 19–32)
Calcium: 9.6 mg/dL (ref 8.4–10.5)
Chloride: 103 meq/L (ref 96–112)
Creatinine, Ser: 0.76 mg/dL (ref 0.40–1.20)
GFR: 77.49 mL/min (ref >60.00)
Glucose, Bld: 94 mg/dL (ref 70–99)
Potassium: 4.1 meq/L (ref 3.5–5.1)
Sodium: 141 meq/L (ref 135–145)
Total Bilirubin: 0.5 mg/dL (ref 0.2–1.2)
Total Protein: 7.3 g/dL (ref 6.0–8.3)

## 2023-03-08 LAB — LIPID PANEL
Cholesterol: 160 mg/dL (ref 0–200)
HDL: 62.7 mg/dL (ref >39.00)
LDL Cholesterol: 69 mg/dL (ref 0–99)
NonHDL: 97.59
Total CHOL/HDL Ratio: 3
Triglycerides: 141 mg/dL (ref 0.0–149.0)
VLDL: 28.2 mg/dL (ref 0.0–40.0)

## 2023-03-08 MED ORDER — LISINOPRIL 20 MG PO TABS: 20.0000 mg | ORAL_TABLET | Freq: Every day | ORAL | 3 refills | Status: DC

## 2023-03-08 MED ORDER — ROSUVASTATIN CALCIUM 20 MG PO TABS: 20.0000 mg | ORAL_TABLET | Freq: Every day | ORAL | 3 refills | Status: DC

## 2023-03-08 MED ORDER — AMLODIPINE BESYLATE 5 MG PO TABS: 5.0000 mg | ORAL_TABLET | Freq: Every day | ORAL | 3 refills | Status: DC

## 2023-03-08 NOTE — Progress Notes (Signed)
Established Patient Office Visit  Subjective   Patient ID: Bridget Strong, female    DOB: [birth year]  Age: 72 y.o. MRN: [id number]  Chief Complaint  Patient presents with   Annual Exam    HPI   Bridget Strong is seen today for physical exam.  She has history of hypertension, osteopenia, breast cancer, hyperlipidemia.  Her medications include lisinopril, rosuvastatin, amlodipine.  Compliant with medications.  Also takes gabapentin currently 200 mg nightly for hot flashes per GYN.  They are tapering her down.  She still has occasional breakthrough hot flashes.  Overall, she is doing well.  She exercises regularly.  Does some resistance training couple days per week and walks regularly.  Had DEXA scan last year which was very stable with T-score -1.6 which was minimal change from prior scan.  No recent falls.  Health maintenance reviewed:  Health Maintenance  Topic Date Due   COVID-19 Vaccine (6 - 2023-24 season) 01/13/2023   Medicare Annual Wellness (AWV)  05/04/2023   MAMMOGRAM  09/02/2024   Fecal DNA (Cologuard)  01/07/2026   Pneumonia Vaccine 41+ Years old  Completed   INFLUENZA VACCINE  Completed   DEXA SCAN  Completed   Hepatitis C Screening  Completed   Zoster Vaccines- Shingrix  Completed   HPV VACCINES  Aged Out   DTaP/Tdap/Td  Discontinued   Colonoscopy  Discontinued   Social history-she been married 56 years.  3 sons and 7 grandchildren.  Never smoked.  No regular alcohol.  Family history-father had history of alcoholism and hypertension as well as stroke.  Mother died age 52 of "old age ".  Brother died age 77.  He had bladder cancer and what sounds like schizophrenia.  Cause of death unknown.  She has a 7 year old sister who is alive and well  Past Medical History:  Diagnosis Date   Breast cancer ([facility]) 2019   Left Breast Cancer   Broken humerus 10-24-13   Cancer [facility])    breast cancer   Chicken pox    Complication of anesthesia 1998   Developed bigeminy post op  hysterectomy   Hyperlipidemia    Hypertension    Personal history of radiation therapy 2019   Left Breast Cancer   Past Surgical History:  Procedure Laterality Date   ABDOMINAL HYSTERECTOMY  1998   TVH--ovaries remain   ABDOMINAL SACROCOLPOPEXY N/A 11/23/2014   Procedure: ABDOMINAL SACROCOLPOPEXY/HALBAN'S CULDOPLASTY;  Surgeon: Patton Salles, MD;  Location: [facility];  Service: Gynecology;  Laterality: N/A;   ANTERIOR AND POSTERIOR REPAIR N/A 11/23/2014   Procedure: ANTERIOR (CYSTOCELE) ;  Surgeon: Patton Salles, MD;  Location: [facility];  Service: Gynecology;  Laterality: N/A;   BLADDER SUSPENSION N/A 11/23/2014   Procedure: EXACT MIDURETERAL SLING;  Surgeon: Patton Salles, MD;  Location: [facility];  Service: Gynecology;  Laterality: N/A;   BREAST LUMPECTOMY Left 2019   BREAST LUMPECTOMY WITH RADIOACTIVE SEED AND SENTINEL LYMPH NODE BIOPSY Left 07/04/2017   Procedure: RADIOACTIVE SEED GUIDED LEFT BREAST LUMPECTOMY, LEFT AXILLARY SENTINEL LYMPH NODE BIOPSY;  Surgeon: Griselda Miner, MD;  Location: [facility];  Service: General;  Laterality: Left;   CHOLECYSTECTOMY  2004   CYSTOSCOPY N/A 11/23/2014   Procedure: CYSTOSCOPY;  Surgeon: Patton Salles, MD;  Location: [facility];  Service: Gynecology;  Laterality: N/A;   LYSIS OF ADHESION N/A 11/23/2014   Procedure: LYSIS OF ADHESION;  Surgeon: Patton Salles, MD;  Location:  [facility];  Service: Gynecology;  Laterality: N/A;   RE-EXCISION OF BREAST LUMPECTOMY Left 08/05/2017   Procedure: RE-EXCISION OF BREAST CANCER LATERAL MARGINS;  Surgeon: Griselda Miner, MD;  Location: [facility];  Service: General;  Laterality: Left;   SALPINGOOPHORECTOMY Bilateral 11/23/2014   Procedure: BILATERAL SALPINGO OOPHORECTOMY;  Surgeon: Patton Salles, MD;  Location: [facility];  Service: Gynecology;  Laterality: Bilateral;   TUBAL LIGATION      reports that she has never smoked. She has never used smokeless  tobacco. She reports that she does not drink alcohol and does not use drugs. family history includes Alcohol abuse in her father and another family member; Arthritis in an other family member; Breast cancer (age of onset: 72) in her paternal grandmother; Breast cancer (age of onset: 82) in her maternal grandmother; Cancer in her brother; Early death in her paternal aunt; Hypertension in her father and another family member; Stroke in her father and another family member. Allergies  Allergen Reactions   Diflucan [Fluconazole] Hives, Itching and Swelling   Hydrocodone Diarrhea and Nausea And Vomiting   Tramadol Other (See Comments)    Makes her sick and feel weird   Oxycodone Other (See Comments)    Dizziness     Review of Systems  Constitutional:  Negative for chills, fever, malaise/fatigue and weight loss.  HENT:  Negative for hearing loss.   Eyes:  Negative for blurred vision and double vision.  Respiratory:  Negative for cough and shortness of breath.   Cardiovascular:  Negative for chest pain, palpitations and leg swelling.  Gastrointestinal:  Negative for abdominal pain, blood in stool, constipation and diarrhea.  Genitourinary:  Negative for dysuria.  Skin:  Negative for rash.  Neurological:  Negative for dizziness, speech change, seizures, loss of consciousness and headaches.  Psychiatric/Behavioral:  Negative for depression.       Objective:     BP 138/60 (BP Location: Left Arm, Patient Position: Sitting, Cuff Size: Normal)   Pulse 69   Temp 98 F (36.7 C) (Oral)   Ht 5' 4.17" (1.63 m)   Wt 124 lb 14.4 oz (56.7 kg)   LMP 05/14/1996   SpO2 98%   BMI 21.32 kg/m  BP Readings from Last 3 Encounters:  03/08/23 138/60  08/01/22 134/82  04/02/22 116/60   Wt Readings from Last 3 Encounters:  03/08/23 124 lb 14.4 oz (56.7 kg)  08/01/22 128 lb (58.1 kg)  04/02/22 124 lb 6.4 oz (56.4 kg)      Physical Exam Vitals reviewed.  Constitutional:      General: She is not  in acute distress.    Appearance: She is well-developed. She is not ill-appearing.  HENT:     Head: Normocephalic and atraumatic.     Right Ear: Ear canal normal.     Left Ear: Ear canal normal.  Eyes:     Pupils: Pupils are equal, round, and reactive to light.  Neck:     Thyroid: No thyromegaly.  Cardiovascular:     Rate and Rhythm: Normal rate and regular rhythm.     Heart sounds: Normal heart sounds. No murmur heard. Pulmonary:     Effort: No respiratory distress.     Breath sounds: Normal breath sounds. No wheezing or rales.  Abdominal:     General: Bowel sounds are normal. There is no distension.     Palpations: Abdomen is soft. There is no mass.     Tenderness: There is no abdominal  tenderness. There is no guarding or rebound.  Musculoskeletal:        General: Normal range of motion.     Cervical back: Normal range of motion and neck supple.  Lymphadenopathy:     Cervical: No cervical adenopathy.  Skin:    Findings: No rash.  Neurological:     Mental Status: She is alert and oriented to person, place, and time.     Cranial Nerves: No cranial nerve deficit.  Psychiatric:        Behavior: Behavior normal.        Thought Content: Thought content normal.        Judgment: Judgment normal.      No results found for any visits on 03/08/23.    The 10-year ASCVD risk score (Arnett DK, et al., 2019) is: 18.9%    Assessment & Plan:   Problem List Items Addressed This Visit       Unprioritized   Hyperlipidemia   Relevant Medications   lisinopril (ZESTRIL) 20 MG tablet   rosuvastatin (CRESTOR) 20 MG tablet   amLODipine (NORVASC) 5 MG tablet   Other Relevant Orders   Lipid panel   CMP   Essential hypertension   Relevant Medications   lisinopril (ZESTRIL) 20 MG tablet   rosuvastatin (CRESTOR) 20 MG tablet   amLODipine (NORVASC) 5 MG tablet   Osteopenia   Other Visit Diagnoses     Physical exam    -  Primary   Relevant Orders   CBC with  Differential/Platelet     72 year old female here today for physical exam.  She is very health-conscious and eats well and exercises regularly.  -Immunizations up-to-date -Refill regular medications for 1 year -Obtain follow-up labs as above -Continue annual flu vaccine -Continue regular weightbearing exercise along with daily calcium and vitamin D -Continue annual mammogram  No follow-ups on file.    Evelena Peat, MD

## 2023-05-14 ENCOUNTER — Ambulatory Visit (INDEPENDENT_AMBULATORY_CARE_PROVIDER_SITE_OTHER): Payer: Medicare Other | Admitting: Family Medicine

## 2023-05-14 ENCOUNTER — Encounter: Payer: Self-pay | Admitting: Family Medicine

## 2023-05-14 DIAGNOSIS — Z0000 Encounter for general adult medical examination without abnormal findings: Secondary | ICD-10-CM | POA: Diagnosis not present

## 2023-05-14 NOTE — Progress Notes (Signed)
 PATIENT CHECK-IN and HEALTH RISK ASSESSMENT QUESTIONNAIRE:  -completed by phone/video for upcoming Medicare Preventive Visit    Pre-Visit Check-in: 1)Vitals (height, wt, BP, etc) - record in vitals section for visit on day of visit Request home vitals (wt, BP, etc.) and enter into vitals, THEN update Vital Signs SmartPhrase below at the top of the HPI. See below.  2)Review and Update Medications, Allergies PMH, Surgeries, Social history in Epic 3)Hospitalizations in the last year with date/reason? N/a  4)Review and Update Care Team (patient's specialists) in Epic 5) Complete PHQ9 in Epic  6) Complete Fall Screening in Epic 7)Review all Health Maintenance Due and order under PCP if not done.  Medicare Wellness Patient Questionnaire:  Answer theses question about your habits: How often do you have a drink containing alcohol?n/a How many drinks containing alcohol do you have on a typical day when you are drinking?n/a How often do you have six or more drinks on one occasion?n/a Have you ever smoked?n/a Quit date if applicable? N/a  How many packs a day do/did you smoke? N/a Do you use smokeless tobacco?n/a Do you use an illicit drugs?n/a On average, how many days per week do you engage in moderate to strenuous exercise (like a brisk walk)?daily, walks daily, goes to the gym several times per week, also goes up and down stairs On average, how many minutes do you engage in exercise at this level?1 hour She feels she eats well, does not eat out, tries to watch portion size Typical breakfast**black coffee**  Typical lunch peanut butter and banana sandwich Typical dinner meat starch and vegetable Typical snacks:**chips or a sweet**  Beverages: water , occasional dr pepper  Answer theses question about your everyday activities: Can you perform most household chores?yes Are you deaf or have significant trouble hearing?no Do you feel that you have a problem with memory?no Do you feel safe  at home?yes Last dentist visit? april 8. Do you have any difficulty performing your everyday activities?no Are you having any difficulty walking, taking medications on your own, and or difficulty managing daily home needs?no Do you have difficulty walking or climbing stairs?no Do you have difficulty dressing or bathing?no Do you have difficulty doing errands alone such as visiting a doctor's office or shopping?no Do you currently have any difficulty preparing food and eating?no Do you currently have any difficulty using the toilet?no Do you have any difficulty managing your finances?no Do you have any difficulties with housekeeping of managing your housekeeping?no She reports they have great social connections at church, in their senior groups and gets out of the house a lot.   Do you have Advanced Directives in place (Living Will, Healthcare Power or Attorney)? yes   Last eye Exam and [facility]   Do you currently use prescribed or non-prescribed narcotic or opioid pain medications?no  Do you have a history or close family history of breast, ovarian, tubal or peritoneal cancer or a family member with BRCA (breast cancer susceptibility 1 and 2) gene mutations? Grandmothers had breast cancer as well as pt - but no known gene mutations  N/a Request home vitals (wt, BP, etc.) and enter into vitals, THEN update Vital Signs SmartPhrase below at the top of the HPI. See below.   Nurse/Assistant Credentials/time stamp: Shanda Sharps, CMA 11:16 AM    ----------------------------------------------------------------------------------------------------------------------------------------------------------------------------------------------------------------------  Because this visit was a virtual/telehealth visit, some criteria may be missing or patient reported. Any vitals not documented were not able to be obtained and vitals that  have been documented are patient  reported.    MEDICARE ANNUAL PREVENTIVE VISIT WITH PROVIDER: (Welcome to Medicare, initial annual wellness or annual wellness exam)  Virtual Visit via Video Note  I connected with CHARVI GAMMAGE on 05/14/23 by  a video enabled telemedicine application and verified that I am speaking with the correct person using two identifiers.  Location patient: home Location provider:work or home office Persons participating in the virtual visit: patient, provider  Concerns and/or follow up today: no concerns.   See HM section in Epic for other details of completed HM.    ROS: negative for report of fevers, unintentional weight loss, vision changes, vision loss, hearing loss or change, chest pain, sob, hemoptysis, melena, hematochezia, hematuria, falls, bleeding or bruising, thoughts of suicide or self harm, memory loss  Patient-completed extensive health risk assessment - reviewed and discussed with the patient: See Health Risk Assessment completed with patient prior to the visit either above or in recent phone note. This was reviewed in detailed with the patient today and appropriate recommendations, orders and referrals were placed as needed per Summary below and patient instructions.   Review of Medical History: -PMH, PSH, Family History and current specialty and care providers reviewed and updated and listed below   Patient Care Team: Micheal Wolm ORN, MD as PCP - General (Family Medicine) Lanny Callander, MD as Consulting Physician (Hematology) Crawford, Morna Pickle, NP as Nurse Practitioner (Hematology and Oncology) Dewey Rush, MD as Consulting Physician (Radiation Oncology) Curvin Deward MOULD, MD as Consulting Physician (General Surgery)   Past Medical History:  Diagnosis Date   Breast cancer [facility]) 2019   Left Breast Cancer   Broken humerus 10-24-13   Cancer [facility])    breast cancer   Chicken pox    Complication of anesthesia 1998   Developed bigeminy post op hysterectomy    Hyperlipidemia    Hypertension    Personal history of radiation therapy 2019   Left Breast Cancer    Past Surgical History:  Procedure Laterality Date   ABDOMINAL HYSTERECTOMY  1998   TVH--ovaries remain   ABDOMINAL SACROCOLPOPEXY N/A 11/23/2014   Procedure: ABDOMINAL SACROCOLPOPEXY/HALBAN'S CULDOPLASTY;  Surgeon: Bobie FORBES Cathlyn JAYSON Nikki, MD;  Location: [facility];  Service: Gynecology;  Laterality: N/A;   ANTERIOR AND POSTERIOR REPAIR N/A 11/23/2014   Procedure: ANTERIOR (CYSTOCELE) ;  Surgeon: Bobie FORBES Cathlyn JAYSON Nikki, MD;  Location: [facility];  Service: Gynecology;  Laterality: N/A;   BLADDER SUSPENSION N/A 11/23/2014   Procedure: EXACT MIDURETERAL SLING;  Surgeon: Bobie FORBES Cathlyn JAYSON Nikki, MD;  Location: [facility];  Service: Gynecology;  Laterality: N/A;   BREAST LUMPECTOMY Left 2019   BREAST LUMPECTOMY WITH RADIOACTIVE SEED AND SENTINEL LYMPH NODE BIOPSY Left 07/04/2017   Procedure: RADIOACTIVE SEED GUIDED LEFT BREAST LUMPECTOMY, LEFT AXILLARY SENTINEL LYMPH NODE BIOPSY;  Surgeon: Curvin Deward MOULD, MD;  Location[facility] OR;  Service: General;  Laterality: Left;   CHOLECYSTECTOMY  2004   CYSTOSCOPY N/A 11/23/2014   Procedure: CYSTOSCOPY;  Surgeon: Bobie FORBES Cathlyn JAYSON Nikki, MD;  Location: [facility];  Service: Gynecology;  Laterality: N/A;   LYSIS OF ADHESION N/A 11/23/2014   Procedure: LYSIS OF ADHESION;  Surgeon: Bobie FORBES Cathlyn JAYSON Nikki, MD;  Location: [facility];  Service: Gynecology;  Laterality: N/A;   RE-EXCISION OF BREAST LUMPECTOMY Left 08/05/2017   Procedure: RE-EXCISION OF BREAST CANCER LATERAL MARGINS;  Surgeon: Curvin Deward MOULD, MD;  Location: [facility];  Service: General;  Laterality: Left;  SALPINGOOPHORECTOMY Bilateral 11/23/2014   Procedure: BILATERAL SALPINGO OOPHORECTOMY;  Surgeon: Bobie FORBES Cathlyn JAYSON Nikki, MD;  Location: [facility];  Service: Gynecology;  Laterality: Bilateral;   TUBAL LIGATION      Social History   Socioeconomic History   Marital status: Married    Spouse name:  Not on file   Number of children: Not on file   Years of education: Not on file   Highest education level: 12th grade  Occupational History   Not on file  Tobacco Use   Smoking status: Never   Smokeless tobacco: Never  Vaping Use   Vaping status: Never Used  Substance and Sexual Activity   Alcohol use: No    Alcohol/week: 0.0 standard drinks of alcohol   Drug use: No   Sexual activity: Not Currently    Partners: Male    Birth control/protection: Surgical    Comment: TVH-ovaries remain, first intercourse >14  Other Topics Concern   Not on file  Social History Narrative   Not on file   Social Drivers of Health   Financial Resource Strain: Low Risk  (04/01/2022)   Overall Financial Resource Strain (CARDIA)    Difficulty of Paying Living Expenses: Not hard at all  Food Insecurity: No Food Insecurity (04/01/2022)   Hunger Vital Sign    Worried About Running Out of Food in the Last Year: Never true    Ran Out of Food in the Last Year: Never true  Transportation Needs: No Transportation Needs (04/01/2022)   PRAPARE - Administrator, Civil Service (Medical): No    Lack of Transportation (Non-Medical): No  Physical Activity: Sufficiently Active (04/01/2022)   Exercise Vital Sign    Days of Exercise per Week: 5 days    Minutes of Exercise per Session: 30 min  Stress: No Stress Concern Present (04/01/2022)   [employer] of Occupational Health - Occupational Stress Questionnaire    Feeling of Stress : Not at all  Social Connections: Socially Integrated (04/01/2022)   Social Connection and Isolation Panel [NHANES]    Frequency of Communication with Friends and Family: More than three times a week    Frequency of Social Gatherings with Friends and Family: Three times a week    Attends Religious Services: More than 4 times per year    Active Member of Clubs or Organizations: Yes    Attends Engineer, Structural: More than 4 times per year    Marital  Status: Married  Catering Manager Violence: Not on file    Family History  Problem Relation Age of Onset   Hypertension Father    Stroke Father    Alcohol abuse Father    Cancer Brother        bladder cancer   Early death Paternal Aunt    Breast cancer Maternal Grandmother 44   Breast cancer Paternal Grandmother 55   Alcohol abuse Other    Arthritis Other    Hypertension Other    Stroke Other     Current Outpatient Medications on File Prior to Visit  Medication Sig Dispense Refill   amLODipine  (NORVASC ) 5 MG tablet Take 1 tablet (5 mg total) by mouth daily. 90 tablet 3   Calcium  Carbonate-Vitamin D (CALCIUM -VITAMIN D) 600-200 MG-UNIT CAPS Take 1 tablet by mouth 2 (two) times daily.     gabapentin  (NEURONTIN ) 100 MG capsule Take 2 capsules (200 mg) by mouth at bedtime. 180 capsule 1   lisinopril  (ZESTRIL ) 20 MG tablet Take 1 tablet (  20 mg total) by mouth daily. 90 tablet 3   rosuvastatin  (CRESTOR ) 20 MG tablet Take 1 tablet (20 mg total) by mouth daily. 90 tablet 3   vitamin C (ASCORBIC ACID) 500 MG tablet Take 1,000 mg by mouth daily.     No current facility-administered medications on file prior to visit.    Allergies  Allergen Reactions   Diflucan  [Fluconazole ] Hives, Itching and Swelling   Hydrocodone Diarrhea and Nausea And Vomiting   Tramadol Other (See Comments)    Makes her sick and feel weird   Oxycodone  Other (See Comments)    Dizziness       Physical Exam Vitals requested from patient and listed below if patient had equipment and was able to obtain at home for this virtual visit: There were no vitals filed for this visit. Estimated body mass index is 21.32 kg/m as calculated from the following:   Height as of 03/08/23: 5' 4.17 (1.63 m).   Weight as of 03/08/23: 124 lb 14.4 oz (56.7 kg).  EKG (optional): deferred due to virtual visit  GENERAL: alert, oriented, no acute distress detected, full vision exam deferred due to pandemic and/or virtual  encounter   HEENT: atraumatic, conjunttiva clear, no obvious abnormalities on inspection of external nose and ears  NECK: normal movements of the head and neck  LUNGS: on inspection no signs of respiratory distress, breathing rate appears normal, no obvious gross SOB, gasping or wheezing  CV: no obvious cyanosis  MS: moves all visible extremities without noticeable abnormality  PSYCH/NEURO: pleasant and cooperative, no obvious depression or anxiety, speech and thought processing grossly intact, Cognitive function grossly intact  Flowsheet Row Video Visit from 05/03/2022 in [facility] [place]  PHQ-9 Total Score 0           05/14/2023   11:11 AM 05/03/2022   11:29 AM 03/02/2022    9:00 AM 03/01/2021    9:00 AM 03/01/2021    8:59 AM  Depression screen PHQ 2/9  Decreased Interest 0 0 0 0 0  Down, Depressed, Hopeless 0 0 0 0 0  PHQ - 2 Score 0 0 0 0 0  Altered sleeping  0     Tired, decreased energy  0     Change in appetite  0     Feeling bad or failure about yourself   0     Trouble concentrating  0     Moving slowly or fidgety/restless  0     Suicidal thoughts  0     PHQ-9 Score  0     Difficult doing work/chores  Not difficult at all          04/01/2022    6:44 PM 05/03/2022   11:29 AM 08/01/2022   10:08 AM 05/14/2023   10:47 AM 05/14/2023   11:10 AM  Fall Risk  Falls in the past year? 0 0 0 0 0  Was there an injury with Fall?  0 0  0  Fall Risk Category Calculator  0 0  0  Fall Risk Category (Retired)  Low     (RETIRED) Patient Fall Risk Level  Low fall risk     Fall risk Follow up     Falls evaluation completed     SUMMARY AND PLAN:  Medicare annual wellness visit, subsequent  Discussed applicable health maintenance/preventive health measures and advised and referred or ordered per patient preferences: -declines the covid vaccine - discussed current recommendations and options, she is  considering, advised if decides to get to  please let us  know so that we can update her chart -had bone density test 02/2022 Health Maintenance  Topic Date Due   COVID-19 Vaccine (6 - 2024-25 season) 05/30/2023 (Originally 01/13/2023)   Medicare Annual Wellness (AWV)  05/13/2024   MAMMOGRAM  09/02/2024   Fecal DNA (Cologuard)  01/07/2026   Pneumonia Vaccine 73+ Years old  Completed   INFLUENZA VACCINE  Completed   DEXA SCAN  Completed   Hepatitis C Screening  Completed   Zoster Vaccines- Shingrix  Completed   HPV VACCINES  Aged Out   DTaP/Tdap/Td  Discontinued   Colonoscopy  Discontinued      Education and counseling on the following was provided based on the above review of health and a plan/checklist for the patient, along with additional information discussed, was provided for the patient in the patient instructions :      -Advised and counseled on a healthy lifestyle - including the importance of a healthy diet, regular physical activity, social connections and stress management. -Reviewed patient's current diet. Advised and counseled on a whole foods based healthy diet. A summary of a healthy diet was provided in the Patient Instructions.  -reviewed patient's current physical activity level and discussed exercise guidelines for adults. She is currently meeting the guidelines and encouraged to continue.  -Advise yearly dental visits at minimum and regular eye exams   Follow up: see patient instructions     Patient Instructions  I really enjoyed getting to talk with you today! I am available on Tuesdays and Thursdays for virtual visits if you have any questions or concerns, or if I can be of any further assistance.   CHECKLIST FROM ANNUAL WELLNESS VISIT:  -Follow up (please call to schedule if not scheduled after visit):   -yearly for annual wellness visit with primary care office  Here is a list of your preventive care/health maintenance measures and the plan for each if any are due:  PLAN For any measures  below that may be due:   Health Maintenance  Topic Date Due   COVID-19 Vaccine (6 - 2024-25 season) 05/30/2023 (Originally 01/13/2023)   Medicare Annual Wellness (AWV)  05/13/2024   MAMMOGRAM  09/02/2024   Fecal DNA (Cologuard)  01/07/2026   Pneumonia Vaccine 55+ Years old  Completed   INFLUENZA VACCINE  Completed   DEXA SCAN  Completed   Hepatitis C Screening  Completed   Zoster Vaccines- Shingrix  Completed   HPV VACCINES  Aged Out   DTaP/Tdap/Td  Discontinued   Colonoscopy  Discontinued    -See a dentist at least yearly  -Get your eyes checked and then per your eye specialist's recommendations  -Other issues addressed today:   -I have included below further information regarding a healthy whole foods based diet, physical activity guidelines for adults, stress management and opportunities for social connections. I hope you find this information useful.   -----------------------------------------------------------------------------------------------------------------------------------------------------------------------------------------------------------------------------------------------------------  NUTRITION: -eat real food: lots of colorful vegetables (half the plate) and fruits -5-7 servings of vegetables and fruits per day (fresh or steamed is best), exp. 2 servings of vegetables with lunch and dinner and 2 servings of fruit per day. Berries and greens such as kale and collards are great choices.  -consume on a regular basis: whole grains (make sure first ingredient on label contains the word whole), fresh fruits, fish, nuts, seeds, healthy oils (such as olive oil, avocado oil, grape seed oil) -may eat small amounts of dairy  and lean meat on occasion, but avoid processed meats such as ham, bacon, lunch meat, etc. -drink water  -try to avoid fast food and pre-packaged foods, processed meat -most experts advise limiting sodium to < 2300mg  per day, should limit further is any  chronic conditions such as high blood pressure, heart disease, diabetes, etc. The American Heart Association advised that < 1500mg  is is ideal -try to avoid foods that contain any ingredients with names you do not recognize  -try to avoid sugar/sweets (except for the natural sugar that occurs in fresh fruit) -try to avoid sweet drinks -try to avoid white rice, white bread, pasta (unless whole grain), white or yellow potatoes  EXERCISE GUIDELINES FOR ADULTS: -if you wish to increase your physical activity, do so gradually and with the approval of your doctor -STOP and seek medical care immediately if you have any chest pain, chest discomfort or trouble breathing when starting or increasing exercise  -move and stretch your body, legs, feet and arms when sitting for long periods -Physical activity guidelines for optimal health in adults: -least 150 minutes per week of aerobic exercise (can talk, but not sing) once approved by your doctor, 20-30 minutes of sustained activity or two 10 minute episodes of sustained activity every day.  -resistance training at least 2 days per week if approved by your doctor -balance exercises 3+ days per week:   Stand somewhere where you have something sturdy to hold onto if you lose balance.    1) lift up on toes, start with 5x per day and work up to 20x   2) stand and lift on leg straight out to the side so that foot is a few inches of the floor, start with 5x each side and work up to 20x each side   3) stand on one foot, start with 5 seconds each side and work up to 20 seconds on each side  If you need ideas or help with getting more active:  -Silver sneakers https://tools.silversneakers.com  -Walk with a Doc: Http://www.duncan-williams.com/  -try to include resistance (weight lifting/strength building) and balance exercises twice per week: or the following link for  ideas: http://castillo-powell.com/  buyducts.dk  STRESS MANAGEMENT: -can try meditating, or just sitting quietly with deep breathing while intentionally relaxing all parts of your body for 5 minutes daily -if you need further help with stress, anxiety or depression please follow up with your primary doctor or contact the wonderful folks at [facility]: [phone]  SOCIAL CONNECTIONS: -options in [place] if you wish to engage in more social and exercise related activities:  -Silver sneakers https://tools.silversneakers.com  -Walk with a Doc: Http://www.duncan-williams.com/  -Check out the [facility] Active Adults 50+ section on the [place] [employer] companies (hiking clubs, book clubs, cards and games, chess, exercise classes, aquatic classes and much more) - see the website for details: https://www.[place].gov/departments/parks-recreation/active-adults50  -YouTube has lots of exercise videos for different ages and abilities as well  -[facility] (a variety of indoor and outdoor inperson activities for adults). [phone]. [address]  -Virtual Online Classes (a variety of topics): see seniorplanet.org or call [phone]  -consider volunteering at a school, hospice center, church, senior center or elsewhere      Chiquita JONELLE Cramp, DO

## 2023-05-14 NOTE — Patient Instructions (Signed)
 I really enjoyed getting to talk with you today! I am available on Tuesdays and Thursdays for virtual visits if you have any questions or concerns, or if I can be of any further assistance.   CHECKLIST FROM ANNUAL WELLNESS VISIT:  -Follow up (please call to schedule if not scheduled after visit):   -yearly for annual wellness visit with primary care office  Here is a list of your preventive care/health maintenance measures and the plan for each if any are due:  PLAN For any measures below that may be due:   Health Maintenance  Topic Date Due   COVID-19 Vaccine (6 - 2024-25 season) 05/30/2023 (Originally 01/13/2023)   Medicare Annual Wellness (AWV)  05/13/2024   MAMMOGRAM  09/02/2024   Fecal DNA (Cologuard)  01/07/2026   Pneumonia Vaccine 63+ Years old  Completed   INFLUENZA VACCINE  Completed   DEXA SCAN  Completed   Hepatitis C Screening  Completed   Zoster Vaccines- Shingrix  Completed   HPV VACCINES  Aged Out   DTaP/Tdap/Td  Discontinued   Colonoscopy  Discontinued    -See a dentist at least yearly  -Get your eyes checked and then per your eye specialist's recommendations  -Other issues addressed today:   -I have included below further information regarding a healthy whole foods based diet, physical activity guidelines for adults, stress management and opportunities for social connections. I hope you find this information useful.   -----------------------------------------------------------------------------------------------------------------------------------------------------------------------------------------------------------------------------------------------------------  NUTRITION: -eat real food: lots of colorful vegetables (half the plate) and fruits -5-7 servings of vegetables and fruits per day (fresh or steamed is best), exp. 2 servings of vegetables with lunch and dinner and 2 servings of fruit per day. Berries and greens such as kale and collards are great  choices.  -consume on a regular basis: whole grains (make sure first ingredient on label contains the word whole), fresh fruits, fish, nuts, seeds, healthy oils (such as olive oil, avocado oil, grape seed oil) -may eat small amounts of dairy and lean meat on occasion, but avoid processed meats such as ham, bacon, lunch meat, etc. -drink water  -try to avoid fast food and pre-packaged foods, processed meat -most experts advise limiting sodium to < 2300mg  per day, should limit further is any chronic conditions such as high blood pressure, heart disease, diabetes, etc. The American Heart Association advised that < 1500mg  is is ideal -try to avoid foods that contain any ingredients with names you do not recognize  -try to avoid sugar/sweets (except for the natural sugar that occurs in fresh fruit) -try to avoid sweet drinks -try to avoid white rice, white bread, pasta (unless whole grain), white or yellow potatoes  EXERCISE GUIDELINES FOR ADULTS: -if you wish to increase your physical activity, do so gradually and with the approval of your doctor -STOP and seek medical care immediately if you have any chest pain, chest discomfort or trouble breathing when starting or increasing exercise  -move and stretch your body, legs, feet and arms when sitting for long periods -Physical activity guidelines for optimal health in adults: -least 150 minutes per week of aerobic exercise (can talk, but not sing) once approved by your doctor, 20-30 minutes of sustained activity or two 10 minute episodes of sustained activity every day.  -resistance training at least 2 days per week if approved by your doctor -balance exercises 3+ days per week:   Stand somewhere where you have something sturdy to hold onto if you lose balance.    1) lift  up on toes, start with 5x per day and work up to 20x   2) stand and lift on leg straight out to the side so that foot is a few inches of the floor, start with 5x each side and work  up to 20x each side   3) stand on one foot, start with 5 seconds each side and work up to 20 seconds on each side  If you need ideas or help with getting more active:  -Silver sneakers https://tools.silversneakers.com  -Walk with a Doc: Http://www.duncan-williams.com/  -try to include resistance (weight lifting/strength building) and balance exercises twice per week: or the following link for ideas: http://castillo-powell.com/  buyducts.dk  STRESS MANAGEMENT: -can try meditating, or just sitting quietly with deep breathing while intentionally relaxing all parts of your body for 5 minutes daily -if you need further help with stress, anxiety or depression please follow up with your primary doctor or contact the wonderful folks at [facility]: [phone]  SOCIAL CONNECTIONS: -options in [place] if you wish to engage in more social and exercise related activities:  -Silver sneakers https://tools.silversneakers.com  -Walk with a Doc: Http://www.duncan-williams.com/  -Check out the [facility] Active Adults 50+ section on the [place] [employer] companies (hiking clubs, book clubs, cards and games, chess, exercise classes, aquatic classes and much more) - see the website for details: https://www.[place].gov/departments/parks-recreation/active-adults50  -YouTube has lots of exercise videos for different ages and abilities as well  -[facility] (a variety of indoor and outdoor inperson activities for adults). [phone]. [address]  -Virtual Online Classes (a variety of topics): see seniorplanet.org or call [phone]  -consider volunteering at a school, hospice center, church, senior center or elsewhere

## 2023-05-22 ENCOUNTER — Other Ambulatory Visit: Payer: Self-pay | Admitting: Obstetrics and Gynecology

## 2023-05-22 DIAGNOSIS — Z0000 Encounter for general adult medical examination without abnormal findings: Secondary | ICD-10-CM

## 2023-06-27 DIAGNOSIS — H4312 Vitreous hemorrhage, left eye: Secondary | ICD-10-CM | POA: Diagnosis not present

## 2023-06-28 ENCOUNTER — Encounter (INDEPENDENT_AMBULATORY_CARE_PROVIDER_SITE_OTHER): Payer: Self-pay | Admitting: Ophthalmology

## 2023-06-28 ENCOUNTER — Ambulatory Visit (INDEPENDENT_AMBULATORY_CARE_PROVIDER_SITE_OTHER): Payer: Medicare Other | Admitting: Ophthalmology

## 2023-06-28 VITALS — BP 169/70 | HR 65

## 2023-06-28 DIAGNOSIS — H35033 Hypertensive retinopathy, bilateral: Secondary | ICD-10-CM | POA: Diagnosis not present

## 2023-06-28 DIAGNOSIS — H43812 Vitreous degeneration, left eye: Secondary | ICD-10-CM | POA: Diagnosis not present

## 2023-06-28 DIAGNOSIS — H25813 Combined forms of age-related cataract, bilateral: Secondary | ICD-10-CM

## 2023-06-28 DIAGNOSIS — H4312 Vitreous hemorrhage, left eye: Secondary | ICD-10-CM | POA: Diagnosis not present

## 2023-06-28 DIAGNOSIS — I10 Essential (primary) hypertension: Secondary | ICD-10-CM

## 2023-06-28 DIAGNOSIS — H33312 Horseshoe tear of retina without detachment, left eye: Secondary | ICD-10-CM

## 2023-06-28 MED ORDER — PREDNISOLONE ACETATE 1 % OP SUSP: 1.0000 [drp] | Freq: Four times a day (QID) | OPHTHALMIC | 0 refills | Status: AC

## 2023-06-28 NOTE — Progress Notes (Signed)
[facility] - Clinic Note  06/28/2023   CHIEF COMPLAINT Patient presents for Retina Evaluation  HISTORY OF PRESENT ILLNESS: Bridget Strong is a 73 y.o. female who presents to the clinic today for:  HPI     Retina Evaluation   In left eye.  This started 3 days ago.  Duration of 3 days.  Associated Symptoms Floaters.  Context:  distance vision, mid-range vision and near vision.  I, the attending physician,  performed the HPI with the patient and updated documentation appropriately.        Comments   Retina eval per Dr Dione Booze for [facility] OS pt states on Tuesday floaters and vision started becoming cloudy a day or so after she denies any flashes of light       Last edited by Rennis Chris, MD on 06/29/2023 12:19 AM.    Pt is here on the referral of Dr. Dione Booze for concern of VH OS, pt states Tuesday night she was taking a bath and suddenly felt like she needed to brush hair out of her face, she states she saw a jagged black line peripherally and then yesterday that line moved centrally, she states now it looks like a spider web and her vision is cloudy   Referring physician: Ernesto Rutherford, MD [address] [place],  [place] [address]  HISTORICAL INFORMATION:  Selected notes from the MEDICAL RECORD NUMBER Referred by Dr. Ernesto Rutherford - PVD w/ VH OS LEE:  Ocular Hx- PMH-   CURRENT MEDICATIONS: Current Outpatient Medications (Ophthalmic Drugs)  Medication Sig   prednisoLONE acetate (PRED FORTE) 1 % ophthalmic suspension Place 1 drop into the left eye 4 (four) times daily for 7 days.   No current facility-administered medications for this visit. (Ophthalmic Drugs)   Current Outpatient Medications (Other)  Medication Sig   amLODipine (NORVASC) 5 MG tablet Take 1 tablet (5 mg total) by mouth daily.   Calcium Carbonate-Vitamin D (CALCIUM-VITAMIN D) 600-200 MG-UNIT CAPS Take 1 tablet by mouth 2 (two) times daily.   gabapentin (NEURONTIN) 100 MG capsule Take 2  capsules (200 mg) by mouth at bedtime.   lisinopril (ZESTRIL) 20 MG tablet Take 1 tablet (20 mg total) by mouth daily.   rosuvastatin (CRESTOR) 20 MG tablet Take 1 tablet (20 mg total) by mouth daily.   vitamin C (ASCORBIC ACID) 500 MG tablet Take 1,000 mg by mouth daily.   No current facility-administered medications for this visit. (Other)   REVIEW OF SYSTEMS: ROS   Positive for: Cardiovascular, Eyes Last edited by Etheleen Mayhew, COT on 06/28/2023  8:04 AM.     ALLERGIES Allergies  Allergen Reactions   Diflucan [Fluconazole] Hives, Itching and Swelling   Hydrocodone Diarrhea and Nausea And Vomiting   Tramadol Other (See Comments)    Makes her sick and feel weird   Oxycodone Other (See Comments)    Dizziness   PAST MEDICAL HISTORY Past Medical History:  Diagnosis Date   Breast cancer (HCC) 2019   Left Breast Cancer   Broken humerus 10-24-13   Cancer [facility])    breast cancer   Chicken pox    Complication of anesthesia 1998   Developed bigeminy post op hysterectomy   Hyperlipidemia    Hypertension    Personal history of radiation therapy 2019   Left Breast Cancer   Past Surgical History:  Procedure Laterality Date   ABDOMINAL HYSTERECTOMY  1998   TVH--ovaries remain   ABDOMINAL SACROCOLPOPEXY N/A 11/23/2014  Procedure: ABDOMINAL SACROCOLPOPEXY/HALBAN'S CULDOPLASTY;  Surgeon: Patton Salles, MD;  Location: [facility];  Service: Gynecology;  Laterality: N/A;   ANTERIOR AND POSTERIOR REPAIR N/A 11/23/2014   Procedure: ANTERIOR (CYSTOCELE) ;  Surgeon: Patton Salles, MD;  Location: [facility];  Service: Gynecology;  Laterality: N/A;   BLADDER SUSPENSION N/A 11/23/2014   Procedure: EXACT MIDURETERAL SLING;  Surgeon: Patton Salles, MD;  Location: [facility];  Service: Gynecology;  Laterality: N/A;   BREAST LUMPECTOMY Left 2019   BREAST LUMPECTOMY WITH RADIOACTIVE SEED AND SENTINEL LYMPH NODE BIOPSY Left 07/04/2017   Procedure: RADIOACTIVE SEED GUIDED  LEFT BREAST LUMPECTOMY, LEFT AXILLARY SENTINEL LYMPH NODE BIOPSY;  Surgeon: Griselda Miner, MD;  [facility];  Service: General;  Laterality: Left;   CHOLECYSTECTOMY  2004   CYSTOSCOPY N/A 11/23/2014   Procedure: CYSTOSCOPY;  Surgeon: Patton Salles, MD;  Location: [facility];  Service: Gynecology;  Laterality: N/A;   LYSIS OF ADHESION N/A 11/23/2014   Procedure: LYSIS OF ADHESION;  Surgeon: Patton Salles, MD;  Location: [facility];  Service: Gynecology;  Laterality: N/A;   RE-EXCISION OF BREAST LUMPECTOMY Left 08/05/2017   Procedure: RE-EXCISION OF BREAST CANCER LATERAL MARGINS;  Surgeon: Griselda Miner, MD;  Location: [facility];  Service: General;  Laterality: Left;   SALPINGOOPHORECTOMY Bilateral 11/23/2014   Procedure: BILATERAL SALPINGO OOPHORECTOMY;  Surgeon: Patton Salles, MD;  Location: [facility];  Service: Gynecology;  Laterality: Bilateral;   TUBAL LIGATION     FAMILY HISTORY Family History  Problem Relation Age of Onset   Hypertension Father    Stroke Father    Alcohol abuse Father    Cancer Brother        bladder cancer   Early death Paternal Aunt    Breast cancer Maternal Grandmother 90   Breast cancer Paternal Grandmother 26   Alcohol abuse Other    Arthritis Other    Hypertension Other    Stroke Other    SOCIAL HISTORY Social History   Tobacco Use   Smoking status: Never   Smokeless tobacco: Never  Vaping Use   Vaping status: Never Used  Substance Use Topics   Alcohol use: No    Alcohol/week: 0.0 standard drinks of alcohol   Drug use: No       OPHTHALMIC EXAM:  Base Eye Exam     Visual Acuity (Snellen - Linear)       Right Left   Dist cc 20/25 -1 20/30 +1   Dist ph cc NI NI    Correction: Glasses         Tonometry (Tonopen, 8:10 AM)       Right Left   Pressure 17 18         Pupils       Pupils Dark Light Shape React APD   Right PERRL 3 2 Round Brisk None   Left PERRL 3 2 Round Brisk None          Visual Fields       Left Right    Full Full         Extraocular Movement       Right Left    Full, Ortho Full, Ortho         Neuro/Psych     Oriented x3: Yes   Mood/Affect: Normal         Dilation     Both  eyes: 2.5% Phenylephrine @ 8:10 AM           Slit Lamp and Fundus Exam     External Exam       Right Left   External Normal Normal         Slit Lamp Exam       Right Left   Lids/Lashes Dermatochalasis - upper lid Dermatochalasis - upper lid   Conjunctiva/Sclera White and quiet White and quiet   Cornea arcus trace tear film debris   Anterior Chamber deep and clear deep and clear   Iris Round and dilated Round and dilated   Lens 2-3+ Nuclear sclerosis, 2-3+ Cortical cataract 2-3+ Nuclear sclerosis, 2-3+ Cortical cataract   Anterior Vitreous Normal Vitreous syneresis, +RBC, Posterior vitreous detachment, vitreous condensations         Fundus Exam       Right Left   Disc Pink and Sharp Pink and Sharp   C/D Ratio 0.4 0.4   Macula Flat, Blunted foveal reflex, mild RPE mottling, No heme or edema hazy view, grossly flat, RPE mottling, No heme or edema   Vessels attenuated, mild tortuosity attenuated, mild tortuosity   Periphery Attached, reticular degeneration, No heme Attached, small HSTs at 1200 and 0130--no SRF, reticular degeneration           IMAGING AND PROCEDURES  Imaging and Procedures for 06/28/2023  OCT, Retina - OU - Both Eyes       Right Eye Quality was good. Central Foveal Thickness: 297. Progression has no prior data. Findings include normal foveal contour, no IRF, no SRF (Partial PVD).   Left Eye Quality was good. Central Foveal Thickness: 305. Progression has no prior data. Findings include normal foveal contour, no IRF, no SRF (Vitreous opacities greatest inferiorly).   Notes *Images captured and stored on drive  Diagnosis / Impression:  OD: NFP, no IRF/SRF, partial PVD OS: Vitreous opacities greatest  inferiorly  Clinical management:  See below  Abbreviations: NFP - Normal foveal profile. CME - cystoid macular edema. PED - pigment epithelial detachment. IRF - intraretinal fluid. SRF - subretinal fluid. EZ - ellipsoid zone. ERM - epiretinal membrane. ORA - outer retinal atrophy. ORT - outer retinal tubulation. SRHM - subretinal hyper-reflective material. IRHM - intraretinal hyper-reflective material      Fluorescein Angiography Optos (Transit OS)       Right Eye Progression has no prior data. Early phase findings include normal observations. Mid/Late phase findings include normal observations.   Left Eye Progression has no prior data. Early phase findings include blockage. Mid/Late phase findings include blockage (Wispy blockage -- VH; no leakage, NV or CNV).   Notes **Images stored on drive**  Impression: OD: normal study OS: Wispy blockage -- VH;  no leakage, NV or CNV      Repair Retinal Breaks, Cryotherapy - OS - Left Eye       CRYOPEXY PROCEDURE NOTE  Preoperative Diagnosis:       Retinal Tears, LEFT EYE Postoperative Diagnosis:     Same  Surgeon:              Rennis Chris, M.D./Ph.D. Assistant:   Laurian Brim, Ophthalmic Assistant  Anesthesia Type:            Subconjunctival lidocaine injection   The patient's LEFT eye was marked and a timeout was performed to verify the correct patient, the correct site, and correct procedure. One drop of Proparacaine was given in the operative eye. A local subconjunctival block was  performed utilizing 2% Lidocaine using a 30 gauge needle from the 11-4 clock hours. A wire eyelid speculum was inserted into the operative eye. Using indirect ophthalmoloscopy, the retinal tears/breaks were identified. Cryotherapy was appropriately utilized in the areas of retinal tears/breaks at the 12 and 0130 clock hours.  A small amount of Maxitrol ointment was instilled to the operative eye and the eye was patched.  The patient was given a  prescription for PredForte gtts to be used QID in the operative eye for 7 days.           ASSESSMENT/PLAN:   ICD-10-CM   1. Horseshoe tear of retina of left eye  H33.312 OCT, Retina - OU - Both Eyes    Repair Retinal Breaks, Cryotherapy - OS - Left Eye    CANCELED: Repair Retinal Breaks, Laser - OS - Left Eye    2. Posterior vitreous detachment of left eye  H43.812 OCT, Retina - OU - Both Eyes    3. Essential hypertension  I10     4. Hypertensive retinopathy of both eyes  H35.033 Fluorescein Angiography Optos (Transit OS)    5. Combined forms of age-related cataract of both eyes  H25.813     6. Vitreous hemorrhage of left eye (HCC)  H43.12 OCT, Retina - OU - Both Eyes    Fluorescein Angiography Optos (Transit OS)     Retinal tear, OS - The incidence, risk factors, and natural history of retinal tear was discussed with patient.   - Potential treatment options including laser retinopexy and cryotherapy discussed with patient. - tears located at 1200 and 0130 - laser retinopexy OS attempted, 02.14.25, but aborted due to poor laser uptake secondary to [facility], cataract, cornea - recommended cryo retinopexy OS to complete treatment of tear  - RBA of procedure discussed, questions answered - informed consent obtained and signed - see procedure note - start PF QID OS x7 days - f/u in 2-3 wks, DFE, OCT  2,3. Hemorrhagic PVD / vitreous syneresis OS  - onset Tuesday, February 11th  - Discussed findings and prognosis  - No  RD on 360 scleral depressed exam -- retinal tears as above  - Reviewed s/s of RT/RD  - Strict return precautions for any such RT/RD signs/symptoms - VH precautions reviewed -- minimize activities, keep head elevated, avoid ASA/NSAIDs/blood thinners as able   - f/u in 3-4 wks -- DFE/OCT  4,5. Hypertensive retinopathy OU - discussed importance of tight BP control - monitor  6. Mixed Cataract OU - The symptoms of cataract, surgical options, and treatments and risks  were discussed with patient. - discussed diagnosis and progression - monitor  Ophthalmic Meds Ordered this visit:  Meds ordered this encounter  Medications   prednisoLONE acetate (PRED FORTE) 1 % ophthalmic suspension    Sig: Place 1 drop into the left eye 4 (four) times daily for 7 days.    Dispense:  10 mL    Refill:  0     Return for f/u 1-2 weeks, HSTs OS, DFE, OCT.  There are no Patient Instructions on file for this visit.  Explained the diagnoses, plan, and follow up with the patient and they expressed understanding.  Patient expressed understanding of the importance of proper follow up care.   This document serves as a record of services personally performed by Karie Chimera, MD, PhD. It was created on their behalf by Glee Arvin. Manson Passey, OA an ophthalmic technician. The creation of this record is the provider's dictation and/or  activities during the visit.    Electronically signed by: Glee Arvin. Manson Passey, OA 06/29/23 12:29 AM   Karie Chimera, M.D., Ph.D. Diseases & Surgery of the Retina and Vitreous [facility] 06/28/2023  I have reviewed the above documentation for accuracy and completeness, and I agree with the above. Karie Chimera, M.D., Ph.D. 06/29/23 12:34 AM    Abbreviations: M myopia (nearsighted); A astigmatism; H hyperopia (farsighted); P presbyopia; Mrx spectacle prescription;  CTL contact lenses; OD right eye; OS left eye; OU both eyes  XT exotropia; ET esotropia; PEK punctate epithelial keratitis; PEE punctate epithelial erosions; DES dry eye syndrome; MGD meibomian gland dysfunction; ATs artificial tears; PFAT's preservative free artificial tears; NSC nuclear sclerotic cataract; PSC posterior subcapsular cataract; ERM epi-retinal membrane; PVD posterior vitreous detachment; RD retinal detachment; DM diabetes mellitus; DR diabetic retinopathy; NPDR non-proliferative diabetic retinopathy; PDR proliferative diabetic retinopathy; CSME clinically  significant macular edema; DME diabetic macular edema; dbh dot blot hemorrhages; CWS cotton wool spot; POAG primary open angle glaucoma; C/D cup-to-disc ratio; HVF humphrey visual field; GVF goldmann visual field; OCT optical coherence tomography; IOP intraocular pressure; BRVO Branch retinal vein occlusion; CRVO central retinal vein occlusion; CRAO central retinal artery occlusion; BRAO branch retinal artery occlusion; RT retinal tear; SB scleral buckle; PPV pars plana vitrectomy; VH Vitreous hemorrhage; PRP panretinal laser photocoagulation; IVK intravitreal kenalog; VMT vitreomacular traction; MH Macular hole;  NVD neovascularization of the disc; NVE neovascularization elsewhere; AREDS age related eye disease study; ARMD age related macular degeneration; POAG primary open angle glaucoma; EBMD epithelial/anterior basement membrane dystrophy; ACIOL anterior chamber intraocular lens; IOL intraocular lens; PCIOL posterior chamber intraocular lens; Phaco/IOL phacoemulsification with intraocular lens placement; PRK photorefractive keratectomy; LASIK laser assisted in situ keratomileusis; HTN hypertension; DM diabetes mellitus; COPD chronic obstructive pulmonary disease

## 2023-07-09 DIAGNOSIS — Z789 Other specified health status: Secondary | ICD-10-CM | POA: Diagnosis not present

## 2023-07-09 DIAGNOSIS — L820 Inflamed seborrheic keratosis: Secondary | ICD-10-CM | POA: Diagnosis not present

## 2023-07-09 DIAGNOSIS — L2989 Other pruritus: Secondary | ICD-10-CM | POA: Diagnosis not present

## 2023-07-09 DIAGNOSIS — L538 Other specified erythematous conditions: Secondary | ICD-10-CM | POA: Diagnosis not present

## 2023-07-09 DIAGNOSIS — R208 Other disturbances of skin sensation: Secondary | ICD-10-CM | POA: Diagnosis not present

## 2023-07-11 NOTE — Progress Notes (Addendum)
 [facility] - Clinic Note  07/12/2023   CHIEF COMPLAINT Patient presents for Post-op Follow-up  HISTORY OF PRESENT ILLNESS: Bridget Strong is a 73 y.o. female who presents to the clinic today for:  HPI     Post-op Follow-up   In left eye.  Discomfort includes Negative for none.  Vision is stable.  I, the attending physician,  performed the HPI with the patient and updated documentation appropriately.        Comments   2 week s/p laser for horseshoe tear. Patient states floaters are better      Last edited by Rennis Chris, MD on 07/14/2023 10:48 PM.     Pt states the floaters in her left eye have improved, she had no problems after the cryo procedure she used PF for one week as directed   Referring physician: Ernesto Rutherford, MD [address] [place],  [place] [address]  HISTORICAL INFORMATION:  Selected notes from the MEDICAL RECORD NUMBER Referred by Dr. Ernesto Rutherford - PVD w/ VH OS LEE:  Ocular Hx- PMH-   CURRENT MEDICATIONS: No current outpatient medications on file. (Ophthalmic Drugs)   No current facility-administered medications for this visit. (Ophthalmic Drugs)   Current Outpatient Medications (Other)  Medication Sig   amLODipine (NORVASC) 5 MG tablet Take 1 tablet (5 mg total) by mouth daily.   Calcium Carbonate-Vitamin D (CALCIUM-VITAMIN D) 600-200 MG-UNIT CAPS Take 1 tablet by mouth 2 (two) times daily.   gabapentin (NEURONTIN) 100 MG capsule Take 2 capsules (200 mg) by mouth at bedtime.   lisinopril (ZESTRIL) 20 MG tablet Take 1 tablet (20 mg total) by mouth daily.   rosuvastatin (CRESTOR) 20 MG tablet Take 1 tablet (20 mg total) by mouth daily.   vitamin C (ASCORBIC ACID) 500 MG tablet Take 1,000 mg by mouth daily.   No current facility-administered medications for this visit. (Other)   REVIEW OF SYSTEMS: ROS   Positive for: Cardiovascular, Eyes Last edited by Lana Fish, COT on 07/12/2023  8:11 AM.       ALLERGIES Allergies  Allergen Reactions   Diflucan [Fluconazole] Hives, Itching and Swelling   Hydrocodone Diarrhea and Nausea And Vomiting   Tramadol Other (See Comments)    Makes her sick and feel weird   Oxycodone Other (See Comments)    Dizziness   PAST MEDICAL HISTORY Past Medical History:  Diagnosis Date   Breast cancer (HCC) 2019   Left Breast Cancer   Broken humerus 10-24-13   Cancer (HCC)    breast cancer   Chicken pox    Complication of anesthesia 1998   Developed bigeminy post op hysterectomy   Hyperlipidemia    Hypertension    Personal history of radiation therapy 2019   Left Breast Cancer   Past Surgical History:  Procedure Laterality Date   ABDOMINAL HYSTERECTOMY  1998   TVH--ovaries remain   ABDOMINAL SACROCOLPOPEXY N/A 11/23/2014   Procedure: ABDOMINAL SACROCOLPOPEXY/HALBAN'S CULDOPLASTY;  Surgeon: Patton Salles, MD;  Location: [facility];  Service: Gynecology;  Laterality: N/A;   ANTERIOR AND POSTERIOR REPAIR N/A 11/23/2014   Procedure: ANTERIOR (CYSTOCELE) ;  Surgeon: Patton Salles, MD;  Location: [facility];  Service: Gynecology;  Laterality: N/A;   BLADDER SUSPENSION N/A 11/23/2014   Procedure: EXACT MIDURETERAL SLING;  Surgeon: Patton Salles, MD;  Location: [facility];  Service: Gynecology;  Laterality: N/A;   BREAST LUMPECTOMY Left 2019   BREAST  LUMPECTOMY WITH RADIOACTIVE SEED AND SENTINEL LYMPH NODE BIOPSY Left 07/04/2017   Procedure: RADIOACTIVE SEED GUIDED LEFT BREAST LUMPECTOMY, LEFT AXILLARY SENTINEL LYMPH NODE BIOPSY;  Surgeon: Griselda Miner, MD;  Location[facility];  Service: General;  Laterality: Left;   CHOLECYSTECTOMY  2004   CYSTOSCOPY N/A 11/23/2014   Procedure: CYSTOSCOPY;  Surgeon: Patton Salles, MD;  Location: [facility];  Service: Gynecology;  Laterality: N/A;   LYSIS OF ADHESION N/A 11/23/2014   Procedure: LYSIS OF ADHESION;  Surgeon: Patton Salles, MD;  Location: [facility];  Service: Gynecology;   Laterality: N/A;   RE-EXCISION OF BREAST LUMPECTOMY Left 08/05/2017   Procedure: RE-EXCISION OF BREAST CANCER LATERAL MARGINS;  Surgeon: Griselda Miner, MD;  Location: [facility];  Service: General;  Laterality: Left;   SALPINGOOPHORECTOMY Bilateral 11/23/2014   Procedure: BILATERAL SALPINGO OOPHORECTOMY;  Surgeon: Patton Salles, MD;  Location: [facility];  Service: Gynecology;  Laterality: Bilateral;   TUBAL LIGATION     FAMILY HISTORY Family History  Problem Relation Age of Onset   Hypertension Father    Stroke Father    Alcohol abuse Father    Cancer Brother        bladder cancer   Early death Paternal Aunt    Breast cancer Maternal Grandmother 58   Breast cancer Paternal Grandmother 9   Alcohol abuse Other    Arthritis Other    Hypertension Other    Stroke Other    SOCIAL HISTORY Social History   Tobacco Use   Smoking status: Never   Smokeless tobacco: Never  Vaping Use   Vaping status: Never Used  Substance Use Topics   Alcohol use: No    Alcohol/week: 0.0 standard drinks of alcohol   Drug use: No       OPHTHALMIC EXAM:  Base Eye Exam     Visual Acuity (Snellen - Linear)       Right Left   Dist cc 20/25+1 20/25   Dist ph cc 20/NI 20/Ni    Correction: Glasses         Tonometry (Tonopen, 8:13 AM)       Right Left   Pressure 10 11         Pupils       Dark Light Shape React APD   Right 3 2 Round Brisk None   Left 3 2 Round Brisk None         Visual Fields (Counting fingers)       Left Right    Full Full         Extraocular Movement       Right Left    Full, Ortho Full, Ortho         Neuro/Psych     Oriented x3: Yes   Mood/Affect: Normal         Dilation     Both eyes: 1.0% Mydriacyl, 2.5% Phenylephrine @ 8:13 AM           Slit Lamp and Fundus Exam     External Exam       Right Left   External Normal Normal         Slit Lamp Exam       Right Left   Lids/Lashes Dermatochalasis -  upper lid Dermatochalasis - upper lid   Conjunctiva/Sclera White and quiet White and quiet   Cornea arcus trace tear film debris   Anterior Chamber deep and clear  deep and clear   Iris Round and dilated Round and dilated   Lens 2-3+ Nuclear sclerosis, 2-3+ Cortical cataract 2-3+ Nuclear sclerosis, 2-3+ Cortical cataract   Anterior Vitreous Normal Vitreous syneresis, +RBC, Posterior vitreous detachment, interval improvement in vitreous condensations         Fundus Exam       Right Left   Disc Pink and Sharp Pink and Sharp, focal heme at 0500   C/D Ratio 0.4 0.4   Macula Flat, Blunted foveal reflex, mild RPE mottling, No heme or edema flat, blunted foveal reflex, RPE mottling, No heme or edema   Vessels attenuated, mild tortuosity attenuated, mild tortuosity   Periphery Attached, reticular degeneration, No heme Attached, small HSTs at 1200 and 0130--no SRF -- good cryo and laser changes surrounding, mild reticular degeneration, no new RT/RD           IMAGING AND PROCEDURES  Imaging and Procedures for 07/12/2023  OCT, Retina - OU - Both Eyes       Right Eye Quality was good. Central Foveal Thickness: 299. Progression has been stable. Findings include normal foveal contour, no IRF, no SRF (Partial PVD).   Left Eye Quality was good. Central Foveal Thickness: 315. Progression has improved. Findings include normal foveal contour, no IRF, no SRF (Interval improvement in vitreous opacities).   Notes *Images captured and stored on drive  Diagnosis / Impression:  OD: NFP, no IRF/SRF, partial PVD OS: NFP; no IRF/SRF; Interval improvement in vitreous opacities  Clinical management:  See below  Abbreviations: NFP - Normal foveal profile. CME - cystoid macular edema. PED - pigment epithelial detachment. IRF - intraretinal fluid. SRF - subretinal fluid. EZ - ellipsoid zone. ERM - epiretinal membrane. ORA - outer retinal atrophy. ORT - outer retinal tubulation. SRHM - subretinal  hyper-reflective material. IRHM - intraretinal hyper-reflective material            ASSESSMENT/PLAN:   ICD-10-CM   1. Horseshoe tear of retina of left eye  H33.312 OCT, Retina - OU - Both Eyes    2. Posterior vitreous detachment of left eye  H43.812     3. Essential hypertension  I10     4. Hypertensive retinopathy of both eyes  H35.033     5. Combined forms of age-related cataract of both eyes  H25.813     6. Vitreous hemorrhage of left eye (HCC)  H43.12       Retinal tear, OS - tears located at 1200 and 0130 - laser retinopexy OS attempted, 02.14.25, but aborted due to poor laser uptake secondary to [facility], cataract, and cornea - s/p cryo retinopexy OS (02.14.25) -- excellent cryo changes in place - completed PF QID OS x7 days - no new RT/RD - f/u in 2 months, DFE, OCT  2,3. Hemorrhagic PVD / vitreous syneresis OS  - onset Tuesday, February 11th  - Discussed findings and prognosis  - retinal tears as above  - Reviewed s/s of RT/RD  - Strict return precautions for any such RT/RD signs/symptoms - VH resolving  - f/u in 2 mos -- DFE/OCT  4,5. Hypertensive retinopathy OU - discussed importance of tight BP control - monitor  6. Mixed Cataract OU - The symptoms of cataract, surgical options, and treatments and risks were discussed with patient. - discussed diagnosis and progression - monitor  Ophthalmic Meds Ordered this visit:  No orders of the defined types were placed in this encounter.    Return in about 2 months (around 09/09/2023) for f/u  2 months HST OS, DFE, OCT.  There are no Patient Instructions on file for this visit.  This document serves as a record of services personally performed by Karie Chimera, MD, PhD. It was created on their behalf by Berlin Hun COT, an ophthalmic technician. The creation of this record is the provider's dictation and/or activities during the visit.    Electronically signed by: Berlin Hun COT 02.27.25 10:58  PM  This document serves as a record of services personally performed by Karie Chimera, MD, PhD. It was created on their behalf by Glee Arvin. Manson Passey, OA an ophthalmic technician. The creation of this record is the provider's dictation and/or activities during the visit.    Electronically signed by: Glee Arvin. Manson Passey, OA 07/14/23 10:58 PM  Karie Chimera, M.D., Ph.D. Diseases & Surgery of the Retina and Vitreous [facility] 07/12/2023  I have reviewed the above documentation for accuracy and completeness, and I agree with the above. Karie Chimera, M.D., Ph.D. 07/14/23 11:02 PM   Abbreviations: M myopia (nearsighted); A astigmatism; H hyperopia (farsighted); P presbyopia; Mrx spectacle prescription;  CTL contact lenses; OD right eye; OS left eye; OU both eyes  XT exotropia; ET esotropia; PEK punctate epithelial keratitis; PEE punctate epithelial erosions; DES dry eye syndrome; MGD meibomian gland dysfunction; ATs artificial tears; PFAT's preservative free artificial tears; NSC nuclear sclerotic cataract; PSC posterior subcapsular cataract; ERM epi-retinal membrane; PVD posterior vitreous detachment; RD retinal detachment; DM diabetes mellitus; DR diabetic retinopathy; NPDR non-proliferative diabetic retinopathy; PDR proliferative diabetic retinopathy; CSME clinically significant macular edema; DME diabetic macular edema; dbh dot blot hemorrhages; CWS cotton wool spot; POAG primary open angle glaucoma; C/D cup-to-disc ratio; HVF humphrey visual field; GVF goldmann visual field; OCT optical coherence tomography; IOP intraocular pressure; BRVO Branch retinal vein occlusion; CRVO central retinal vein occlusion; CRAO central retinal artery occlusion; BRAO branch retinal artery occlusion; RT retinal tear; SB scleral buckle; PPV pars plana vitrectomy; VH Vitreous hemorrhage; PRP panretinal laser photocoagulation; IVK intravitreal kenalog; VMT vitreomacular traction; MH Macular hole;  NVD  neovascularization of the disc; NVE neovascularization elsewhere; AREDS age related eye disease study; ARMD age related macular degeneration; POAG primary open angle glaucoma; EBMD epithelial/anterior basement membrane dystrophy; ACIOL anterior chamber intraocular lens; IOL intraocular lens; PCIOL posterior chamber intraocular lens; Phaco/IOL phacoemulsification with intraocular lens placement; PRK photorefractive keratectomy; LASIK laser assisted in situ keratomileusis; HTN hypertension; DM diabetes mellitus; COPD chronic obstructive pulmonary disease

## 2023-07-12 ENCOUNTER — Ambulatory Visit (INDEPENDENT_AMBULATORY_CARE_PROVIDER_SITE_OTHER): Payer: Medicare Other | Admitting: Ophthalmology

## 2023-07-12 ENCOUNTER — Encounter (INDEPENDENT_AMBULATORY_CARE_PROVIDER_SITE_OTHER): Payer: Self-pay | Admitting: Ophthalmology

## 2023-07-12 DIAGNOSIS — H4312 Vitreous hemorrhage, left eye: Secondary | ICD-10-CM

## 2023-07-12 DIAGNOSIS — H33312 Horseshoe tear of retina without detachment, left eye: Secondary | ICD-10-CM | POA: Diagnosis not present

## 2023-07-12 DIAGNOSIS — H43812 Vitreous degeneration, left eye: Secondary | ICD-10-CM

## 2023-07-12 DIAGNOSIS — H25813 Combined forms of age-related cataract, bilateral: Secondary | ICD-10-CM | POA: Diagnosis not present

## 2023-07-12 DIAGNOSIS — H35033 Hypertensive retinopathy, bilateral: Secondary | ICD-10-CM

## 2023-07-12 DIAGNOSIS — I10 Essential (primary) hypertension: Secondary | ICD-10-CM

## 2023-08-05 ENCOUNTER — Encounter: Payer: Medicare Other | Admitting: Obstetrics and Gynecology

## 2023-08-05 NOTE — Progress Notes (Signed)
 73 y.o. [id number] Married Caucasian female here for a breast and pelvic exam.    The patient is also followed for menopausal symptoms and recurrent cystocele.  She takes gabapentin.  Some hot flashes and night sweats at night.  Doing well on 200 mg overall.  It does help her to sleep.   She has a second degree cystocele. Up to void every 2 hours at night.  No urgency or frequency.  No urinary incontinence.  She is not pursuing treatment options.    Had a left torn retina.    PCP: Kristian Covey, MD   Patient's last menstrual period was 05/14/1996.           Sexually active: No.  The current method of family planning is status post hysterectomy.    Menopausal hormone therapy:  n/a Exercising: Yes.     Gym 2x a week, walking 2x a week Smoker:  no  OB History     Gravida  4   Para  3   Term  3   Preterm      AB  1   Living  3      SAB  1   IAB      Ectopic      Multiple      Live Births              HEALTH MAINTENANCE: Last 2 paps: 06/14/17 neg, 04/23/12 neg History of abnormal Pap or positive HPV:  no Mammogram:  09/03/22 Breast Density Cat B, BI-RADS CAT 1 neg.  Has appointment on 09/04/23.  Colonoscopy:  05/14/06, cologuard 2023 Bone Density:  03/07/22  Result  osteopenia.  PCP monitoring.    Immunization History  Administered Date(s) Administered   Fluad Quad(high Dose 65+) 02/07/2019, 03/01/2021, 01/24/2022   Fluad Trivalent(High Dose 65+) 02/15/2023   Influenza Split 02/19/2011, 03/07/2012   Influenza Whole 02/21/2010   Influenza, High Dose Seasonal PF 02/14/2015, 02/09/2016, 02/18/2017, 02/21/2018, 02/29/2020   Influenza,inj,Quad PF,6+ Mos 03/16/2013, 03/17/2014   PFIZER Comirnaty(Gray Top)Covid-19 Tri-Sucrose Vaccine 10/23/2020   PFIZER(Purple Top)SARS-COV-2 Vaccination 06/19/2019, 07/10/2019, 02/11/2020   Pfizer Covid-19 Vaccine Bivalent Booster 5y-11y 03/01/2022   Pneumococcal Conjugate-13 02/09/2016   Pneumococcal Polysaccharide-23  11/25/2014   Td 02/21/2010   Zoster Recombinant(Shingrix) 12/08/2017, 03/02/2018   Zoster, Live 03/07/2012      reports that she has never smoked. She has never used smokeless tobacco. She reports that she does not drink alcohol and does not use drugs.  Past Medical History:  Diagnosis Date   Breast cancer (HCC) 2019   Left Breast Cancer   Broken humerus 10-24-13   Cancer [facility])    breast cancer   Chicken pox    Complication of anesthesia 1998   Developed bigeminy post op hysterectomy   Hyperlipidemia    Hypertension    Personal history of radiation therapy 2019   Left Breast Cancer    Past Surgical History:  Procedure Laterality Date   ABDOMINAL HYSTERECTOMY  1998   TVH--ovaries remain   ABDOMINAL SACROCOLPOPEXY N/A 11/23/2014   Procedure: ABDOMINAL SACROCOLPOPEXY/HALBAN'S CULDOPLASTY;  Surgeon: Patton Salles, MD;  Location: [facility];  Service: Gynecology;  Laterality: N/A;   ANTERIOR AND POSTERIOR REPAIR N/A 11/23/2014   Procedure: ANTERIOR (CYSTOCELE) ;  Surgeon: Patton Salles, MD;  Location: [facility];  Service: Gynecology;  Laterality: N/A;   BLADDER SUSPENSION N/A 11/23/2014   Procedure: EXACT MIDURETERAL SLING;  Surgeon: Patton Salles, MD;  Location: [facility];  Service: Gynecology;  Laterality: N/A;   BREAST LUMPECTOMY Left 2019   BREAST LUMPECTOMY WITH RADIOACTIVE SEED AND SENTINEL LYMPH NODE BIOPSY Left 07/04/2017   Procedure: RADIOACTIVE SEED GUIDED LEFT BREAST LUMPECTOMY, LEFT AXILLARY SENTINEL LYMPH NODE BIOPSY;  Surgeon: Griselda Miner, MD;  Location: [facility];  Service: General;  Laterality: Left;   CHOLECYSTECTOMY  2004   CYSTOSCOPY N/A 11/23/2014   Procedure: CYSTOSCOPY;  Surgeon: Patton Salles, MD;  Location: [facility];  Service: Gynecology;  Laterality: N/A;   LYSIS OF ADHESION N/A 11/23/2014   Procedure: LYSIS OF ADHESION;  Surgeon: Patton Salles, MD;  Location: [facility];  Service: Gynecology;  Laterality: N/A;   RE-EXCISION  OF BREAST LUMPECTOMY Left 08/05/2017   Procedure: RE-EXCISION OF BREAST CANCER LATERAL MARGINS;  Surgeon: Griselda Miner, MD;  Location: [facility];  Service: General;  Laterality: Left;   SALPINGOOPHORECTOMY Bilateral 11/23/2014   Procedure: BILATERAL SALPINGO OOPHORECTOMY;  Surgeon: Patton Salles, MD;  Location: [facility];  Service: Gynecology;  Laterality: Bilateral;   TUBAL LIGATION      Current Outpatient Medications  Medication Sig Dispense Refill   amLODipine (NORVASC) 5 MG tablet Take 1 tablet (5 mg total) by mouth daily. 90 tablet 3   Calcium Carbonate-Vitamin D (CALCIUM-VITAMIN D) 600-200 MG-UNIT CAPS Take 1 tablet by mouth 2 (two) times daily.     gabapentin (NEURONTIN) 100 MG capsule Take 2 capsules (200 mg) by mouth at bedtime. 180 capsule 1   lisinopril (ZESTRIL) 20 MG tablet Take 1 tablet (20 mg total) by mouth daily. 90 tablet 3   rosuvastatin (CRESTOR) 20 MG tablet Take 1 tablet (20 mg total) by mouth daily. 90 tablet 3   vitamin C (ASCORBIC ACID) 500 MG tablet Take 1,000 mg by mouth daily.     No current facility-administered medications for this visit.    ALLERGIES: Diflucan [fluconazole], Hydrocodone, Tramadol, and Oxycodone  Family History  Problem Relation Age of Onset   Hypertension Father    Stroke Father    Alcohol abuse Father    Cancer Brother        bladder cancer   Early death Paternal Aunt    Breast cancer Maternal Grandmother 38   Breast cancer Paternal Grandmother 30   Alcohol abuse Other    Arthritis Other    Hypertension Other    Stroke Other     Review of Systems  All other systems reviewed and are negative.   PHYSICAL EXAM:  BP 126/72 (BP Location: Left Arm, Patient Position: Sitting, Cuff Size: Small)   Pulse 66   Ht 5' 5.25" (1.657 m)   Wt 125 lb (56.7 kg)   LMP 05/14/1996   SpO2 97%   BMI 20.64 kg/m     General appearance: alert, cooperative and appears stated age Head: normocephalic, without obvious  abnormality, atraumatic Neck: no adenopathy, supple, symmetrical, trachea midline and thyroid normal to inspection and palpation Lungs: clear to auscultation bilaterally Breasts: right with normal appearance, left nipple absent, no masses or tenderness, No nipple retraction or dimpling, No nipple discharge or bleeding, No axillary adenopathy Heart: regular rate and rhythm Abdomen: soft, non-tender; no masses, no organomegaly Extremities: extremities normal, atraumatic, no cyanosis or edema Skin: skin color, texture, turgor normal. No rashes or lesions Lymph nodes: cervical, supraclavicular, and axillary nodes normal. Neurologic: grossly normal  Pelvic: External genitalia:  no lesions  No abnormal inguinal nodes palpated.              Urethra:  normal appearing urethra with no masses, tenderness or lesions              Bartholins and Skenes: normal                 Vagina: normal appearing vagina with normal color and discharge, no lesions.  Second degree cystocele.   Good apical and posterior vaginal wall support.               Cervix: absent              Pap taken: no Bimanual Exam:  Uterus:  absent              Adnexa: no mass, fullness, tenderness              Rectal exam: yes.  Confirms.              Anus:  normal sphincter tone, no lesions  Chaperone was present for exam:  Warren Lacy, CMA  ASSESSMENT: Encounter for breast and pelvic exam.  Personal history of other medical treatment.  Personal history of left breast cancer.  Menopausal symptoms.  Encounter for medication monitoring.  Recurrent cystocele.  Stable.   Status post hysterectomy. Status post abdominal sacrocolpopexy, Halban's culdoplasty, BSO, lysis of adhesions, TVT exact midurethral sling, cystoscopy, anterior colporrhaphy.  PLAN: Mammogram screening discussed. Self breast awareness reviewed. Pap and HRV collected:  no.  Not indicated.  Guidelines for Calcium, Vitamin D, regular exercise program  including cardiovascular and weight bearing exercise. We discussed her cystocele and options for care/treatment:  observation, surgical correction, pessary.  Patient would like to do observational management.  Medication refills:  Gabapentin 100 mg q hs.  Will try to lower dosage.  BMD in 2025 at [place].  Labs with PCP.  Follow up:  yearly and prn.    Additional counseling given.  yes. 20 min  total time was spent for this patient encounter, including preparation, face-to-face counseling with the patient, coordination of care, and documentation of the encounter in addition to doing the breast and pelvic exam.

## 2023-08-19 ENCOUNTER — Encounter: Payer: Self-pay | Admitting: Obstetrics and Gynecology

## 2023-08-19 ENCOUNTER — Ambulatory Visit (INDEPENDENT_AMBULATORY_CARE_PROVIDER_SITE_OTHER): Admitting: Obstetrics and Gynecology

## 2023-08-19 VITALS — BP 126/72 | HR 66 | Ht 65.25 in | Wt 125.0 lb

## 2023-08-19 DIAGNOSIS — N951 Menopausal and female climacteric states: Secondary | ICD-10-CM | POA: Diagnosis not present

## 2023-08-19 DIAGNOSIS — N8110 Cystocele, unspecified: Secondary | ICD-10-CM

## 2023-08-19 DIAGNOSIS — Z9289 Personal history of other medical treatment: Secondary | ICD-10-CM | POA: Diagnosis not present

## 2023-08-19 DIAGNOSIS — Z853 Personal history of malignant neoplasm of breast: Secondary | ICD-10-CM | POA: Diagnosis not present

## 2023-08-19 DIAGNOSIS — Z5181 Encounter for therapeutic drug level monitoring: Secondary | ICD-10-CM

## 2023-08-19 DIAGNOSIS — Z01419 Encounter for gynecological examination (general) (routine) without abnormal findings: Secondary | ICD-10-CM | POA: Diagnosis not present

## 2023-08-19 MED ORDER — GABAPENTIN 100 MG PO CAPS: ORAL_CAPSULE | ORAL | 3 refills | Status: AC

## 2023-08-19 NOTE — Patient Instructions (Signed)

## 2023-09-04 ENCOUNTER — Ambulatory Visit
Admission: RE | Admit: 2023-09-04 | Discharge: 2023-09-04 | Disposition: A | Discharge status: Home / Self Care | Payer: Medicare Other | Source: Ambulatory Visit | Attending: Obstetrics and Gynecology | Admitting: Obstetrics and Gynecology

## 2023-09-04 DIAGNOSIS — Z1231 Encounter for screening mammogram for malignant neoplasm of breast: Secondary | ICD-10-CM | POA: Diagnosis not present

## 2023-09-04 DIAGNOSIS — Z0000 Encounter for general adult medical examination without abnormal findings: Secondary | ICD-10-CM

## 2023-09-10 ENCOUNTER — Encounter: Payer: Self-pay | Admitting: Obstetrics and Gynecology

## 2023-09-11 NOTE — Progress Notes (Signed)
 [facility] - Clinic Note  09/13/2023   CHIEF COMPLAINT Patient presents for Retina Follow Up  HISTORY OF PRESENT ILLNESS: Bridget Strong is a 73 y.o. female who presents to the clinic today for:  HPI     Retina Follow Up   Patient presents with  Retinal Break/Detachment.  In left eye.  I, the attending physician,  performed the HPI with the patient and updated documentation appropriately.        Comments   Patient states she is seeing a cobweb in the left eye after the tear. She is using AT's.      Last edited by Bridget Coffee, MD on 09/15/2023 11:00 PM.    Pt states she sees a cobweb with a black dot in it in her left eye, she states it is always there, she feels like it's in the middle of her vision   Referring physician: Maris Sickle, MD [address] [place],  [place] [address]  HISTORICAL INFORMATION:  Selected notes from the MEDICAL RECORD NUMBER Referred by Dr. Maris Strong - PVD w/ VH OS LEE:  Ocular Hx- PMH-   CURRENT MEDICATIONS: No current outpatient medications on file. (Ophthalmic Drugs)   No current facility-administered medications for this visit. (Ophthalmic Drugs)   Current Outpatient Medications (Other)  Medication Sig   amLODipine  (NORVASC ) 5 MG tablet Take 1 tablet (5 mg total) by mouth daily.   Calcium  Carbonate-Vitamin D (CALCIUM -VITAMIN D) 600-200 MG-UNIT CAPS Take 1 tablet by mouth 2 (two) times daily.   gabapentin  (NEURONTIN ) 100 MG capsule Take 1 capsules (100 mg) by mouth at bedtime.   lisinopril  (ZESTRIL ) 20 MG tablet Take 1 tablet (20 mg total) by mouth daily.   rosuvastatin  (CRESTOR ) 20 MG tablet Take 1 tablet (20 mg total) by mouth daily.   vitamin C (ASCORBIC ACID) 500 MG tablet Take 1,000 mg by mouth daily.   No current facility-administered medications for this visit. (Other)   REVIEW OF SYSTEMS: ROS   Positive for: Cardiovascular, Eyes Last edited by Bridget Strong, COT on 09/13/2023  7:47 AM.      ALLERGIES Allergies  Allergen Reactions   Diflucan  [Fluconazole ] Hives, Itching and Swelling   Hydrocodone Diarrhea and Nausea And Vomiting   Tramadol Other (See Comments)    Makes her sick and feel weird   Oxycodone  Other (See Comments)    Dizziness   PAST MEDICAL HISTORY Past Medical History:  Diagnosis Date   Breast cancer (HCC) 2019   Left Breast Cancer   Broken humerus 10-24-13   Cancer (HCC)    breast cancer   Chicken pox    Complication of anesthesia 1998   Developed bigeminy post op hysterectomy   Hyperlipidemia    Hypertension    Personal history of radiation therapy 2019   Left Breast Cancer   Past Surgical History:  Procedure Laterality Date   ABDOMINAL HYSTERECTOMY  1998   TVH--ovaries remain   ABDOMINAL SACROCOLPOPEXY N/A 11/23/2014   Procedure: ABDOMINAL SACROCOLPOPEXY/HALBAN'S CULDOPLASTY;  Surgeon: Bridget Leatherwood, MD;  Location: [facility];  Service: Gynecology;  Laterality: N/A;   ANTERIOR AND POSTERIOR REPAIR N/A 11/23/2014   Procedure: ANTERIOR (CYSTOCELE) ;  Surgeon: Bridget Leatherwood, MD;  Location: [facility];  Service: Gynecology;  Laterality: N/A;   BLADDER SUSPENSION N/A 11/23/2014   Procedure: EXACT MIDURETERAL SLING;  Surgeon: Bridget Leatherwood, MD;  Location: [facility];  Service: Gynecology;  Laterality: N/A;  BREAST LUMPECTOMY Left 2019   BREAST LUMPECTOMY WITH RADIOACTIVE SEED AND SENTINEL LYMPH NODE BIOPSY Left 07/04/2017   Procedure: RADIOACTIVE SEED GUIDED LEFT BREAST LUMPECTOMY, LEFT AXILLARY SENTINEL LYMPH NODE BIOPSY;  Surgeon: Bridget Chandler, MD;  Location: [facility] OR;  Service: General;  Laterality: Left;   CHOLECYSTECTOMY  2004   CYSTOSCOPY N/A 11/23/2014   Procedure: CYSTOSCOPY;  Surgeon: Bridget Leatherwood, MD;  Location: [facility];  Service: Gynecology;  Laterality: N/A;   LYSIS OF ADHESION N/A 11/23/2014   Procedure: LYSIS OF ADHESION;  Surgeon: Bridget Leatherwood, MD;  Location: [facility];  Service: Gynecology;   Laterality: N/A;   RE-EXCISION OF BREAST LUMPECTOMY Left 08/05/2017   Procedure: RE-EXCISION OF BREAST CANCER LATERAL MARGINS;  Surgeon: Bridget Chandler, MD;  Location: [facility];  Service: General;  Laterality: Left;   SALPINGOOPHORECTOMY Bilateral 11/23/2014   Procedure: BILATERAL SALPINGO OOPHORECTOMY;  Surgeon: Bridget Leatherwood, MD;  Location: [facility];  Service: Gynecology;  Laterality: Bilateral;   TUBAL LIGATION     FAMILY HISTORY Family History  Problem Relation Age of Onset   Hypertension Father    Stroke Father    Alcohol abuse Father    Cancer Brother        bladder cancer   Early death Paternal Aunt    Breast cancer Maternal Grandmother 78   Breast cancer Paternal Grandmother 60   Alcohol abuse Other    Arthritis Other    Hypertension Other    Stroke Other    SOCIAL HISTORY Social History   Tobacco Use   Smoking status: Never   Smokeless tobacco: Never  Vaping Use   Vaping status: Never Used  Substance Use Topics   Alcohol use: No    Alcohol/week: 0.0 standard drinks of alcohol   Drug use: No       OPHTHALMIC EXAM:  Base Eye Exam     Visual Acuity (Snellen - Linear)       Right Left   Dist cc 20/25 20/25   Dist ph cc NI NI    Correction: Glasses         Tonometry (Tonopen, 7:50 AM)       Right Left   Pressure 14 11         Pupils       Dark Light Shape React APD   Right 3 2 Round Brisk None   Left 3 2 Round Brisk None         Visual Fields       Left Right    Full Full         Extraocular Movement       Right Left    Full, Ortho Full, Ortho         Neuro/Psych     Oriented x3: Yes   Mood/Affect: Normal         Dilation     Both eyes: 1.0% Mydriacyl, 2.5% Phenylephrine  @ 7:48 AM           Slit Lamp and Fundus Exam     External Exam       Right Left   External Normal Normal         Slit Lamp Exam       Right Left   Lids/Lashes Dermatochalasis - upper lid Dermatochalasis -  upper lid   Conjunctiva/Sclera White and quiet White and quiet   Cornea arcus arcus, trace PEE   Anterior  Chamber deep and clear deep and clear   Iris Round and dilated Round and dilated   Lens 2-3+ Nuclear sclerosis, 2-3+ Cortical cataract 2-3+ Nuclear sclerosis, 2-3+ Cortical cataract   Anterior Vitreous Normal Vitreous syneresis, +RBC -- improved, Posterior vitreous detachment, trace vitreous condensations         Fundus Exam       Right Left   Disc Pink and Sharp Pink and Sharp, focal heme at 0500 -- fading   C/D Ratio 0.4 0.6   Macula Flat, Blunted foveal reflex, mild RPE mottling, No heme or edema flat, blunted foveal reflex, RPE mottling, No heme or edema   Vessels attenuated, mild tortuosity attenuated, mild tortuosity   Periphery Attached, reticular degeneration, No heme Attached, small HSTs at 1200 and 0130 -- no SRF -- good cryo and laser changes surrounding, mild reticular degeneration, no new RT/RD           IMAGING AND PROCEDURES  Imaging and Procedures for 09/13/2023  OCT, Retina - OU - Both Eyes       Right Eye Quality was good. Central Foveal Thickness: 299. Progression has been stable. Findings include normal foveal contour, no IRF, no SRF (Partial PVD).   Left Eye Quality was good. Central Foveal Thickness: 322. Progression has improved. Findings include normal foveal contour, no IRF, no SRF (Interval improvement in vitreous opacities).   Notes *Images captured and stored on drive  Diagnosis / Impression:  OD: NFP, no IRF/SRF, partial PVD OS: NFP; no IRF/SRF; Interval improvement in vitreous opacities  Clinical management:  See below  Abbreviations: NFP - Normal foveal profile. CME - cystoid macular edema. PED - pigment epithelial detachment. IRF - intraretinal fluid. SRF - subretinal fluid. EZ - ellipsoid zone. ERM - epiretinal membrane. ORA - outer retinal atrophy. ORT - outer retinal tubulation. SRHM - subretinal hyper-reflective material. IRHM -  intraretinal hyper-reflective material           ASSESSMENT/PLAN:   ICD-10-CM   1. Horseshoe tear of retina of left eye  H33.312 OCT, Retina - OU - Both Eyes    2. Posterior vitreous detachment of left eye  H43.812 OCT, Retina - OU - Both Eyes    3. Essential hypertension  I10     4. Hypertensive retinopathy of both eyes  H35.033     5. Combined forms of age-related cataract of both eyes  H25.813     6. Vitreous hemorrhage of left eye (HCC)  H43.12      Retinal tear, OS - tears located at 1200 and 0130 - laser retinopexy OS attempted, 02.14.25, but aborted due to poor laser uptake secondary to [facility], cataract, and cornea - s/p cryo retinopexy OS (02.14.25) -- excellent cryo changes in place - no new RT/RD - f/u in 12 months, DFE, OCT  2,3. Hemorrhagic PVD / vitreous syneresis OS  - onset Tuesday, February 11th  - Discussed findings and prognosis  - retinal tears as above  - Reviewed s/s of RT/RD  - Strict return precautions for any such RT/RD signs/symptoms - VH cleared  - monitor  4,5. Hypertensive retinopathy OU - discussed importance of tight BP control - monitor  6. Mixed Cataract OU - The symptoms of cataract, surgical options, and treatments and risks were discussed with patient. - discussed diagnosis and progression - monitor  Ophthalmic Meds Ordered this visit:  No orders of the defined types were placed in this encounter.    Return in about 1 year (around 09/12/2024) for f/u  retinal tear OS, DFE, OCT.  There are no Patient Instructions on file for this visit.  This document serves as a record of services personally performed by Jeanice Millard, MD, PhD. It was created on their behalf by Eller Gut COT, an ophthalmic technician. The creation of this record is the provider's dictation and/or activities during the visit.    Electronically signed by: Eller Gut COT 04.30.25 11:05 PM  This document serves as a record of services personally  performed by Jeanice Millard, MD, PhD. It was created on their behalf by Morley Arabia. Bevin Bucks, OA an ophthalmic technician. The creation of this record is the provider's dictation and/or activities during the visit.    Electronically signed by: Morley Arabia. Bevin Bucks, OA 09/15/23 11:05 PM  Jeanice Millard, M.D., Ph.D. Diseases & Surgery of the Retina and Vitreous [facility] 09/13/2023   I have reviewed the above documentation for accuracy and completeness, and I agree with the above. Jeanice Millard, M.D., Ph.D. 09/15/23 11:05 PM   Abbreviations: M myopia (nearsighted); A astigmatism; H hyperopia (farsighted); P presbyopia; Mrx spectacle prescription;  CTL contact lenses; OD right eye; OS left eye; OU both eyes  XT exotropia; ET esotropia; PEK punctate epithelial keratitis; PEE punctate epithelial erosions; DES dry eye syndrome; MGD meibomian gland dysfunction; ATs artificial tears; PFAT's preservative free artificial tears; NSC nuclear sclerotic cataract; PSC posterior subcapsular cataract; ERM epi-retinal membrane; PVD posterior vitreous detachment; RD retinal detachment; DM diabetes mellitus; DR diabetic retinopathy; NPDR non-proliferative diabetic retinopathy; PDR proliferative diabetic retinopathy; CSME clinically significant macular edema; DME diabetic macular edema; dbh dot blot hemorrhages; CWS cotton wool spot; POAG primary open angle glaucoma; C/D cup-to-disc ratio; HVF humphrey visual field; GVF goldmann visual field; OCT optical coherence tomography; IOP intraocular pressure; BRVO Branch retinal vein occlusion; CRVO central retinal vein occlusion; CRAO central retinal artery occlusion; BRAO branch retinal artery occlusion; RT retinal tear; SB scleral buckle; PPV pars plana vitrectomy; VH Vitreous hemorrhage; PRP panretinal laser photocoagulation; IVK intravitreal kenalog ; VMT vitreomacular traction; MH Macular hole;  NVD neovascularization of the disc; NVE neovascularization  elsewhere; AREDS age related eye disease study; ARMD age related macular degeneration; POAG primary open angle glaucoma; EBMD epithelial/anterior basement membrane dystrophy; ACIOL anterior chamber intraocular lens; IOL intraocular lens; PCIOL posterior chamber intraocular lens; Phaco/IOL phacoemulsification with intraocular lens placement; PRK photorefractive keratectomy; LASIK laser assisted in situ keratomileusis; HTN hypertension; DM diabetes mellitus; COPD chronic obstructive pulmonary disease

## 2023-09-13 ENCOUNTER — Ambulatory Visit (INDEPENDENT_AMBULATORY_CARE_PROVIDER_SITE_OTHER): Payer: Medicare Other | Admitting: Ophthalmology

## 2023-09-13 DIAGNOSIS — H35033 Hypertensive retinopathy, bilateral: Secondary | ICD-10-CM | POA: Diagnosis not present

## 2023-09-13 DIAGNOSIS — H43812 Vitreous degeneration, left eye: Secondary | ICD-10-CM | POA: Diagnosis not present

## 2023-09-13 DIAGNOSIS — H33312 Horseshoe tear of retina without detachment, left eye: Secondary | ICD-10-CM | POA: Diagnosis not present

## 2023-09-13 DIAGNOSIS — I10 Essential (primary) hypertension: Secondary | ICD-10-CM | POA: Diagnosis not present

## 2023-09-13 DIAGNOSIS — H25813 Combined forms of age-related cataract, bilateral: Secondary | ICD-10-CM

## 2023-09-13 DIAGNOSIS — H4312 Vitreous hemorrhage, left eye: Secondary | ICD-10-CM

## 2023-09-15 ENCOUNTER — Encounter (INDEPENDENT_AMBULATORY_CARE_PROVIDER_SITE_OTHER): Payer: Self-pay | Admitting: Ophthalmology

## 2023-12-18 ENCOUNTER — Other Ambulatory Visit: Payer: Self-pay | Admitting: Family Medicine

## 2023-12-21 IMAGING — MG MM DIGITAL SCREENING BILAT W/ TOMO AND CAD
8 series · 8 of 24 positions shown · non-contrast
Comparison: Previous exam(s).

CLINICAL DATA: Screening.

EXAM:
DIGITAL SCREENING BILATERAL MAMMOGRAM WITH TOMOSYNTHESIS AND CAD
TECHNIQUE: Bilateral screening digital craniocaudal and mediolateral oblique
mammograms were obtained. Bilateral screening digital breast
tomosynthesis was performed. The images were evaluated with
computer-aided detection.

[L MLO synth-2D]
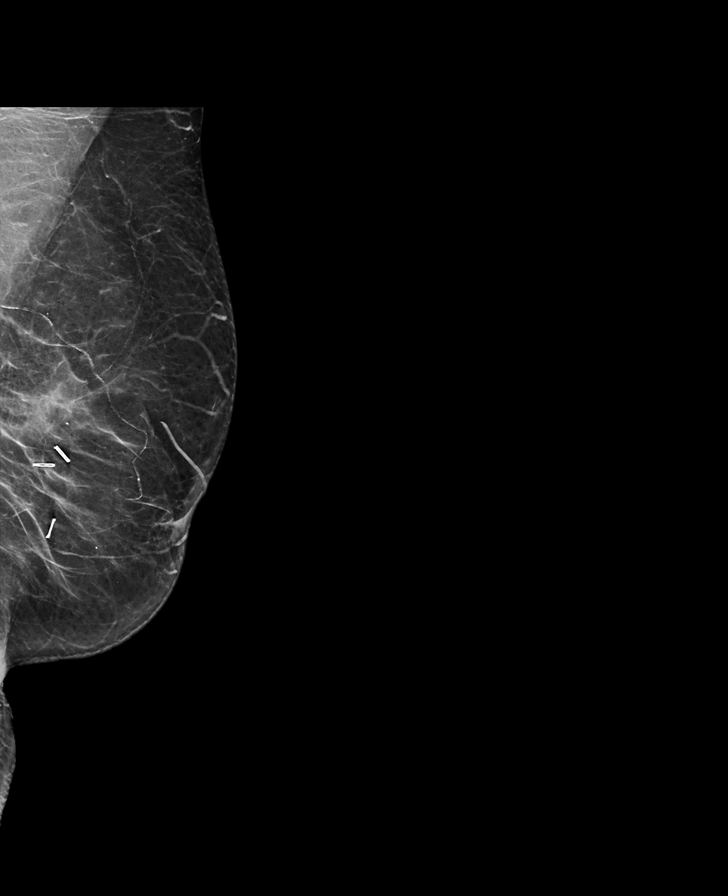

[R MLO synth-2D]
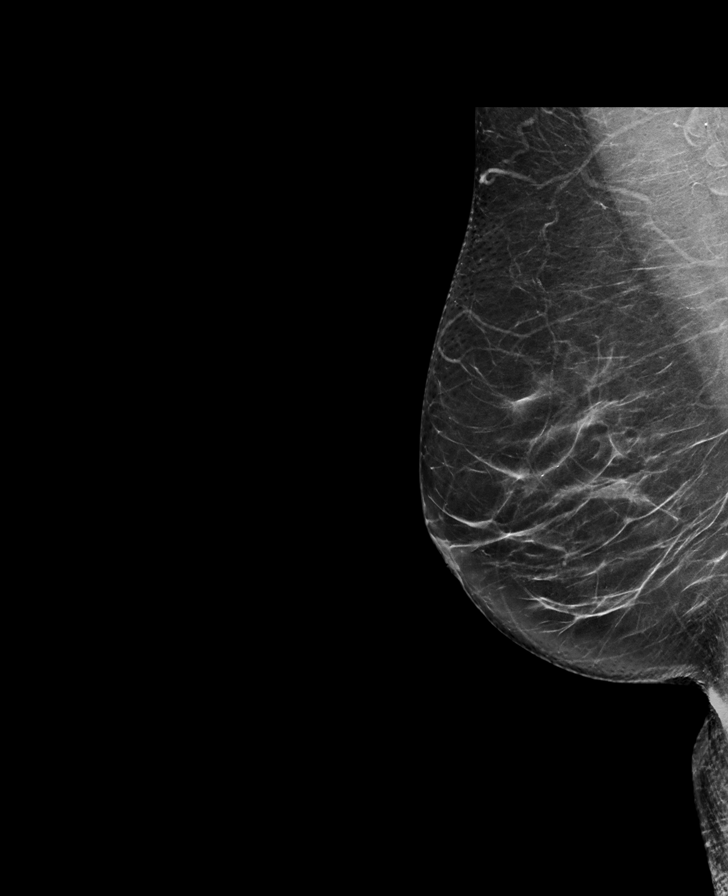

[R CC synth-2D]
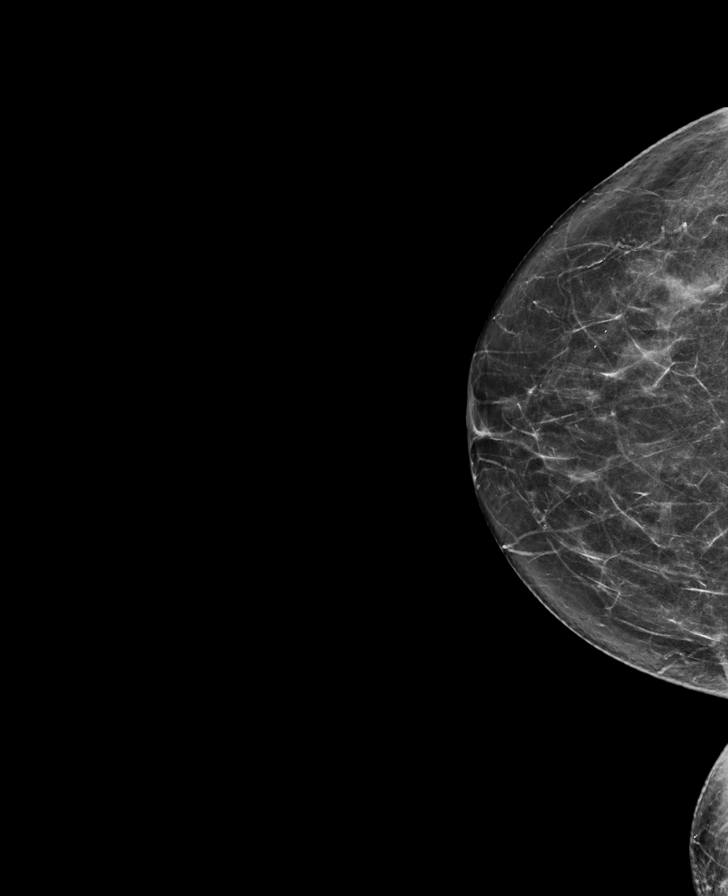

[L CC synth-2D]
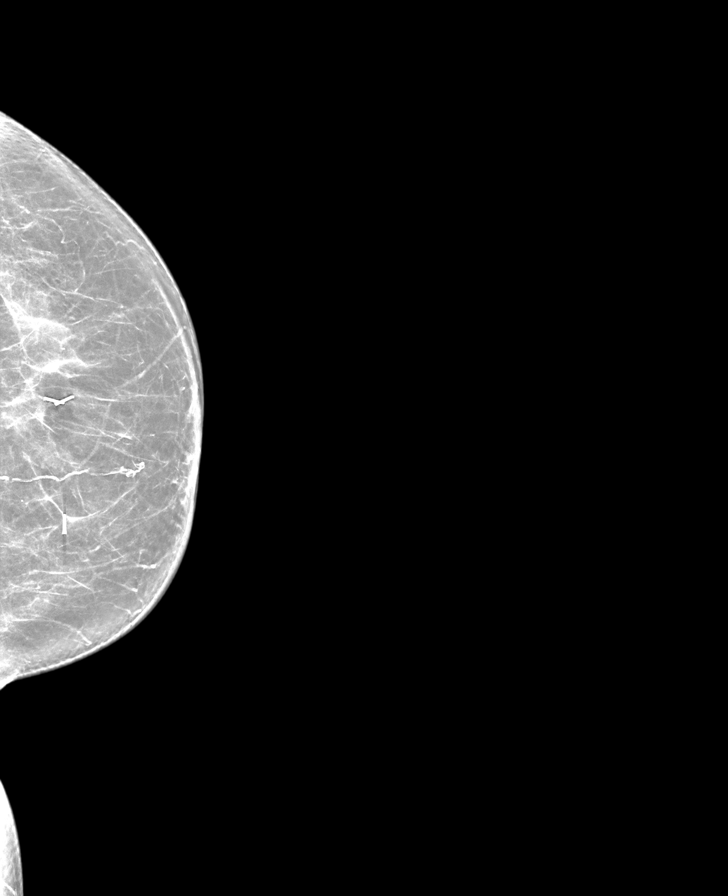

[L CC tomo · tomo slice 21/40.0]
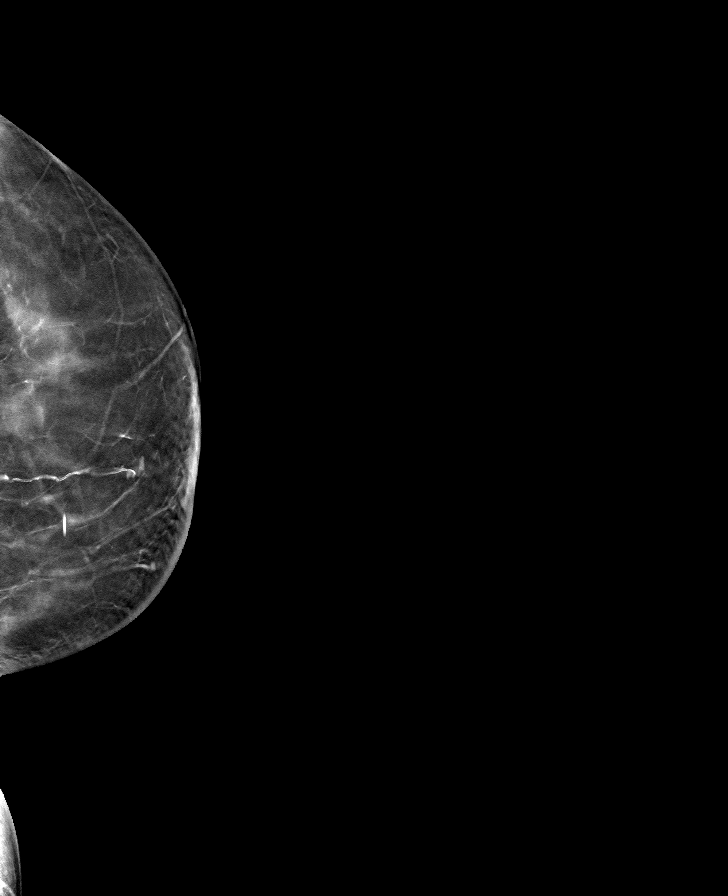

[R CC tomo · tomo slice 32/63.0]
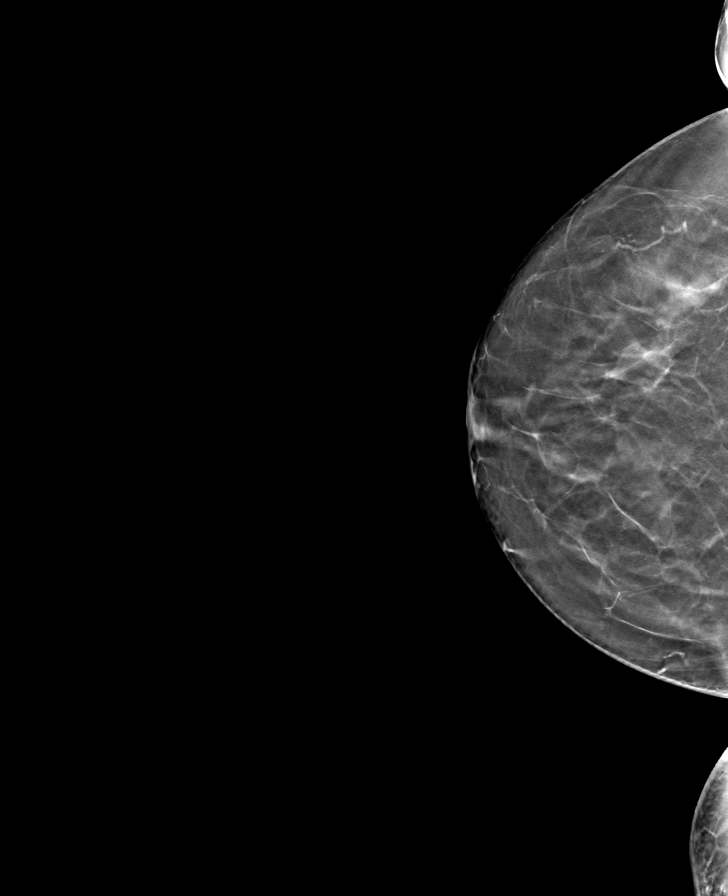

[L MLO tomo · tomo slice 36/71.0]
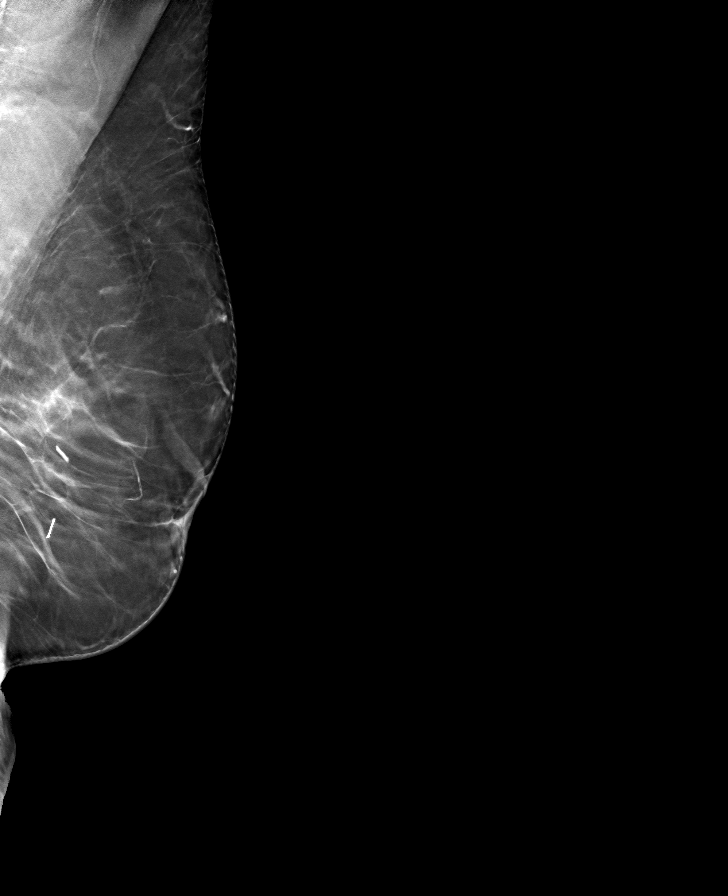

[R MLO tomo · tomo slice 37/74.0]
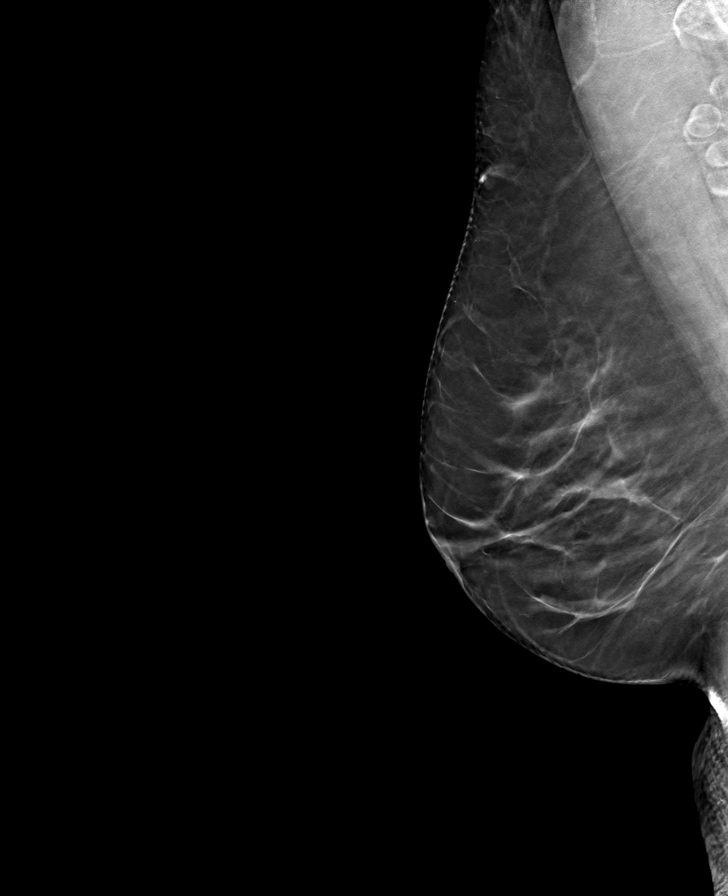

[8 of 24 positions shown; findings below may reference images not displayed]

ACR Breast Density Category b: There are scattered areas of
fibroglandular density.
FINDINGS: There are no findings suspicious for malignancy.
IMPRESSION: No mammographic evidence of malignancy. A result letter of this
screening mammogram will be mailed directly to the patient.

RECOMMENDATION:
Screening mammogram in one year. (Code:[id number])

BI-RADS CATEGORY  1: Negative.

## 2024-02-12 ENCOUNTER — Ambulatory Visit (INDEPENDENT_AMBULATORY_CARE_PROVIDER_SITE_OTHER)

## 2024-02-12 DIAGNOSIS — Z23 Encounter for immunization: Secondary | ICD-10-CM

## 2024-02-19 ENCOUNTER — Encounter: Payer: Self-pay | Admitting: Family Medicine

## 2024-02-19 ENCOUNTER — Ambulatory Visit: Payer: Medicare Other | Admitting: Family Medicine

## 2024-02-19 VITALS — BP 120/64 | HR 74 | Temp 98.2°F | Ht 64.17 in | Wt 122.2 lb

## 2024-02-19 DIAGNOSIS — I10 Essential (primary) hypertension: Secondary | ICD-10-CM | POA: Diagnosis not present

## 2024-02-19 DIAGNOSIS — E785 Hyperlipidemia, unspecified: Secondary | ICD-10-CM | POA: Diagnosis not present

## 2024-02-19 DIAGNOSIS — Z0000 Encounter for general adult medical examination without abnormal findings: Secondary | ICD-10-CM | POA: Diagnosis not present

## 2024-02-19 LAB — COMPREHENSIVE METABOLIC PANEL WITH GFR
ALT: 15 U/L (ref 0–35)
AST: 16 U/L (ref 0–37)
Albumin: 4.6 g/dL (ref 3.5–5.2)
Alkaline Phosphatase: 69 U/L (ref 39–117)
BUN: 18 mg/dL (ref 6–23)
CO2: 30 meq/L (ref 19–32)
Calcium: 9.5 mg/dL (ref 8.4–10.5)
Chloride: 103 meq/L (ref 96–112)
Creatinine, Ser: 0.83 mg/dL (ref 0.40–1.20)
GFR: 69.25 mL/min (ref >60.00)
Glucose, Bld: 99 mg/dL (ref 70–99)
Potassium: 4.6 meq/L (ref 3.5–5.1)
Sodium: 140 meq/L (ref 135–145)
Total Bilirubin: 0.6 mg/dL (ref 0.2–1.2)
Total Protein: 7.3 g/dL (ref 6.0–8.3)

## 2024-02-19 LAB — CBC WITH DIFFERENTIAL/PLATELET
Basophils Absolute: 0 K/uL (ref 0.0–0.1)
Basophils Relative: 0.6 % (ref 0.0–3.0)
Eosinophils Absolute: 0 K/uL (ref 0.0–0.7)
Eosinophils Relative: 1.1 % (ref 0.0–5.0)
HCT: 40.3 % (ref 36.0–46.0)
Hemoglobin: 13.1 g/dL (ref 12.0–15.0)
Lymphocytes Relative: 41.4 % (ref 12.0–46.0)
Lymphs Abs: 1.8 K/uL (ref 0.7–4.0)
MCHC: 32.6 g/dL (ref 30.0–36.0)
MCV: 94.1 fl (ref 78.0–100.0)
Monocytes Absolute: 0.4 K/uL (ref 0.1–1.0)
Monocytes Relative: 8.5 % (ref 3.0–12.0)
Neutro Abs: 2.1 K/uL (ref 1.4–7.7)
Neutrophils Relative %: 48.4 % (ref 43.0–77.0)
Platelets: 243 K/uL (ref 150.0–400.0)
RBC: 4.28 Mil/uL (ref 3.87–5.11)
RDW: 13.5 % (ref 11.5–15.5)
WBC: 4.3 K/uL (ref 4.0–10.5)

## 2024-02-19 LAB — LIPID PANEL
Cholesterol: 170 mg/dL (ref 0–200)
HDL: 56.7 mg/dL (ref >39.00)
LDL Cholesterol: 71 mg/dL (ref 0–99)
NonHDL: 112.83
Total CHOL/HDL Ratio: 3
Triglycerides: 208 mg/dL — ABNORMAL HIGH (ref 0.0–149.0)
VLDL: 41.6 mg/dL — ABNORMAL HIGH (ref 0.0–40.0)

## 2024-02-19 MED ORDER — LISINOPRIL 20 MG PO TABS: 20.0000 mg | ORAL_TABLET | Freq: Every day | ORAL | 3 refills | Status: AC

## 2024-02-19 MED ORDER — ROSUVASTATIN CALCIUM 20 MG PO TABS: 20.0000 mg | ORAL_TABLET | Freq: Every day | ORAL | 3 refills | Status: AC

## 2024-02-19 MED ORDER — AMLODIPINE BESYLATE 5 MG PO TABS: 5.0000 mg | ORAL_TABLET | Freq: Every day | ORAL | 3 refills | Status: AC

## 2024-02-19 NOTE — Progress Notes (Signed)
 Established Patient Office Visit  Subjective   Patient ID: Bridget Strong, female    DOB: [birth year]  Age: 73 y.o. MRN: [id number]  Chief Complaint  Patient presents with   Annual Exam    HPI    Bridget Strong is seen today for physical exam.  She has history of hypertension, hyperlipidemia, osteopenia, breast cancer.  Generally doing well.  She did have left retinal tear back in February and has had some residual visual problems since then.  She sees gynecologist and still tapering off gabapentin  and plans to be off that completely soon.  Very health-conscious.  Goes to the gym 2 days/week and walks fairly regularly.  No recent falls.  Last DEXA scan couple years ago osteopenia with T-score -1.6.  Health maintenance reviewed:  Health Maintenance  Topic Date Due   COVID-19 Vaccine (6 - 2025-26 season) 01/13/2024   Medicare Annual Wellness (AWV)  05/13/2024   Mammogram  09/03/2025   Fecal DNA (Cologuard)  01/07/2026   Pneumococcal Vaccine: 50+ Years  Completed   Influenza Vaccine  Completed   DEXA SCAN  Completed   Hepatitis C Screening  Completed   Zoster Vaccines- Shingrix  Completed   Meningococcal B Vaccine  Aged Out   DTaP/Tdap/Td  Discontinued   Colonoscopy  Discontinued   - She had negative Cologuard 2024 -Gets regular mammograms especially with her history of breast cancer  Social history-married for 57 years.  She has 3 sons and 7 grandchildren.  No history of smoking.  No alcohol history.  Stays very active and exercises regularly as above  Family history-father had history of alcoholism and hypertension as well as stroke. Mother died age 54 of old age . Brother died age 58. He had bladder cancer and what sounds like schizophrenia. Cause of death unknown. She has a 23 year old sister who is alive and well   Past Medical History:  Diagnosis Date   Breast cancer (HCC) 2019   Left Breast Cancer   Broken humerus 10-24-13   Cancer [facility])    breast cancer   Chicken  pox    Complication of anesthesia 1998   Developed bigeminy post op hysterectomy   Hyperlipidemia    Hypertension    Personal history of radiation therapy 2019   Left Breast Cancer   Past Surgical History:  Procedure Laterality Date   ABDOMINAL HYSTERECTOMY  1998   TVH--ovaries remain   ABDOMINAL SACROCOLPOPEXY N/A 11/23/2014   Procedure: ABDOMINAL SACROCOLPOPEXY/HALBAN'S CULDOPLASTY;  Surgeon: Bobie FORBES Cathlyn JAYSON Nikki, MD;  Location: [facility];  Service: Gynecology;  Laterality: N/A;   ANTERIOR AND POSTERIOR REPAIR N/A 11/23/2014   Procedure: ANTERIOR (CYSTOCELE) ;  Surgeon: Bobie FORBES Cathlyn JAYSON Nikki, MD;  Location: [facility];  Service: Gynecology;  Laterality: N/A;   BLADDER SUSPENSION N/A 11/23/2014   Procedure: EXACT MIDURETERAL SLING;  Surgeon: Bobie FORBES Cathlyn JAYSON Nikki, MD;  Location: [facility];  Service: Gynecology;  Laterality: N/A;   BREAST LUMPECTOMY Left 2019   BREAST LUMPECTOMY WITH RADIOACTIVE SEED AND SENTINEL LYMPH NODE BIOPSY Left 07/04/2017   Procedure: RADIOACTIVE SEED GUIDED LEFT BREAST LUMPECTOMY, LEFT AXILLARY SENTINEL LYMPH NODE BIOPSY;  Surgeon: Curvin Deward MOULD, MD;  Location: [facility] OR;  Service: General;  Laterality: Left;   CHOLECYSTECTOMY  2004   CYSTOSCOPY N/A 11/23/2014   Procedure: CYSTOSCOPY;  Surgeon: Bobie FORBES Cathlyn JAYSON Nikki, MD;  Location: [facility];  Service: Gynecology;  Laterality: N/A;   LYSIS OF ADHESION N/A 11/23/2014   Procedure: LYSIS OF  ADHESION;  Surgeon: Bobie FORBES Cathlyn JAYSON Nikki, MD;  Location: [facility];  Service: Gynecology;  Laterality: N/A;   RE-EXCISION OF BREAST LUMPECTOMY Left 08/05/2017   Procedure: RE-EXCISION OF BREAST CANCER LATERAL MARGINS;  Surgeon: Curvin Deward MOULD, MD;  Location: [facility];  Service: General;  Laterality: Left;   SALPINGOOPHORECTOMY Bilateral 11/23/2014   Procedure: BILATERAL SALPINGO OOPHORECTOMY;  Surgeon: Bobie FORBES Cathlyn JAYSON Nikki, MD;  Location: [facility];  Service: Gynecology;  Laterality: Bilateral;   TUBAL LIGATION       reports that she has never smoked. She has never used smokeless tobacco. She reports that she does not drink alcohol and does not use drugs. family history includes Alcohol abuse in her father and another family member; Arthritis in an other family member; Breast cancer (age of onset: 26) in her paternal grandmother; Breast cancer (age of onset: 69) in her maternal grandmother; Cancer in her brother; Early death in her paternal aunt; Hypertension in her father and another family member; Stroke in her father and another family member. Allergies  Allergen Reactions   Diflucan  [Fluconazole ] Hives, Itching and Swelling   Hydrocodone Diarrhea and Nausea And Vomiting   Tramadol Other (See Comments)    Makes her sick and feel weird   Oxycodone  Other (See Comments)    Dizziness    Review of Systems  Constitutional:  Negative for chills, fever, malaise/fatigue and weight loss.  HENT:  Negative for hearing loss.   Eyes:  Negative for blurred vision and double vision.  Respiratory:  Negative for cough and shortness of breath.   Cardiovascular:  Negative for chest pain, palpitations and leg swelling.  Gastrointestinal:  Negative for abdominal pain, blood in stool, constipation and diarrhea.  Genitourinary:  Negative for dysuria.  Skin:  Negative for rash.  Neurological:  Negative for dizziness, speech change, seizures, loss of consciousness and headaches.  Psychiatric/Behavioral:  Negative for depression.       Objective:     BP 120/64   Pulse 74   Temp 98.2 F (36.8 C) (Oral)   Ht 5' 4.17 (1.63 m)   Wt 122 lb 3.2 oz (55.4 kg)   LMP 05/14/1996   SpO2 96%   BMI 20.86 kg/m  BP Readings from Last 3 Encounters:  02/19/24 120/64  08/19/23 126/72  06/28/23 (!) 169/70   Wt Readings from Last 3 Encounters:  02/19/24 122 lb 3.2 oz (55.4 kg)  08/19/23 125 lb (56.7 kg)  03/08/23 124 lb 14.4 oz (56.7 kg)      Physical Exam Vitals reviewed.  Constitutional:      General: She is not in  acute distress.    Appearance: She is well-developed. She is not ill-appearing.  HENT:     Head: Normocephalic and atraumatic.  Neck:     Thyroid : No thyromegaly.  Cardiovascular:     Rate and Rhythm: Normal rate and regular rhythm.     Heart sounds: Normal heart sounds. No murmur heard. Pulmonary:     Effort: No respiratory distress.     Breath sounds: Normal breath sounds. No wheezing or rales.  Abdominal:     General: Bowel sounds are normal. There is no distension.     Palpations: Abdomen is soft. There is no mass.     Tenderness: There is no abdominal tenderness. There is no guarding or rebound.  Musculoskeletal:        General: Normal range of motion.     Cervical back: Normal range of motion and  neck supple.     Right lower leg: No edema.     Left lower leg: No edema.  Lymphadenopathy:     Cervical: No cervical adenopathy.  Skin:    Findings: No rash.  Neurological:     Mental Status: She is alert and oriented to person, place, and time.     Cranial Nerves: No cranial nerve deficit.  Psychiatric:        Behavior: Behavior normal.        Thought Content: Thought content normal.        Judgment: Judgment normal.      No results found for any visits on 02/19/24.  Last CBC Lab Results  Component Value Date   WBC 4.8 03/08/2023   HGB 13.1 03/08/2023   HCT 40.7 03/08/2023   MCV 94.1 03/08/2023   MCH 31.0 06/26/2017   RDW 13.4 03/08/2023   PLT 246.0 03/08/2023   Last metabolic panel Lab Results  Component Value Date   GLUCOSE 94 03/08/2023   NA 141 03/08/2023   K 4.1 03/08/2023   CL 103 03/08/2023   CO2 30 03/08/2023   BUN 15 03/08/2023   CREATININE 0.76 03/08/2023   GFR 77.49 03/08/2023   CALCIUM  9.6 03/08/2023   PROT 7.3 03/08/2023   ALBUMIN 4.5 03/08/2023   BILITOT 0.5 03/08/2023   ALKPHOS 81 03/08/2023   AST 16 03/08/2023   ALT 14 03/08/2023   ANIONGAP 8 06/26/2017   Last lipids Lab Results  Component Value Date   CHOL 160 03/08/2023   HDL  62.70 03/08/2023   LDLCALC 69 03/08/2023   LDLDIRECT 95.0 03/01/2021   TRIG 141.0 03/08/2023   CHOLHDL 3 03/08/2023      The 89-bzjm ASCVD risk score (Arnett DK, et al., 2019) is: 16.4%    Assessment & Plan:   Problem List Items Addressed This Visit       Unprioritized   Hyperlipidemia - Primary   Relevant Medications   lisinopril  (ZESTRIL ) 20 MG tablet   rosuvastatin  (CRESTOR ) 20 MG tablet   amLODipine  (NORVASC ) 5 MG tablet   Other Relevant Orders   Lipid panel   CMP   Essential hypertension   Relevant Medications   lisinopril  (ZESTRIL ) 20 MG tablet   rosuvastatin  (CRESTOR ) 20 MG tablet   amLODipine  (NORVASC ) 5 MG tablet   Other Visit Diagnoses       Physical exam       Relevant Orders   CBC with Differential/Platelet     Physical exam.  73 year old female with chronic medical problems as above.  Generally doing well.  She did have left retinal tear back in February.  We discussed the following health maintenance items  -Flu vaccine already given - Vaccines up-to-date with exception of no history of RSV vaccine.  She will consider at some point this year - Cologuard -2024 and aged out of further colon cancer screening - Continue regular weightbearing exercise and especially resistance training which she is currently doing 2 days/week - Consider possible repeat DEXA scan in about 1 year - Continue annual mammography through GYN - Refilled all medications for 1 year - Obtaining follow-up labs as above  No follow-ups on file.    Wolm Scarlet, MD

## 2024-02-19 NOTE — Patient Instructions (Signed)
 Consider RSV vaccine at some point this next year.

## 2024-02-20 ENCOUNTER — Ambulatory Visit: Payer: Self-pay | Admitting: Family Medicine

## 2024-02-25 DIAGNOSIS — H35033 Hypertensive retinopathy, bilateral: Secondary | ICD-10-CM | POA: Diagnosis not present

## 2024-02-25 DIAGNOSIS — H33302 Unspecified retinal break, left eye: Secondary | ICD-10-CM | POA: Diagnosis not present

## 2024-02-25 DIAGNOSIS — H2513 Age-related nuclear cataract, bilateral: Secondary | ICD-10-CM | POA: Diagnosis not present

## 2024-02-25 DIAGNOSIS — H43812 Vitreous degeneration, left eye: Secondary | ICD-10-CM | POA: Diagnosis not present

## 2024-05-19 ENCOUNTER — Other Ambulatory Visit: Payer: Self-pay | Admitting: Obstetrics and Gynecology

## 2024-05-19 DIAGNOSIS — Z1231 Encounter for screening mammogram for malignant neoplasm of breast: Secondary | ICD-10-CM

## 2024-08-25 ENCOUNTER — Ambulatory Visit: Admitting: Obstetrics and Gynecology

## 2024-09-04 ENCOUNTER — Ambulatory Visit

## 2024-09-11 ENCOUNTER — Encounter (INDEPENDENT_AMBULATORY_CARE_PROVIDER_SITE_OTHER): Admitting: Ophthalmology

## 2025-02-16 ENCOUNTER — Ambulatory Visit
